# Patient Record
Sex: Female | Born: 1975 | Race: White | Hispanic: No | Marital: Married | Smoking: Never smoker
Health system: Southern US, Community
[De-identification: ages and names within clinical notes are randomized; demographics above are authoritative.]

## PROBLEM LIST (undated history)

## (undated) DIAGNOSIS — Z87442 Personal history of urinary calculi: Secondary | ICD-10-CM

## (undated) DIAGNOSIS — N930 Postcoital and contact bleeding: Secondary | ICD-10-CM

## (undated) DIAGNOSIS — N390 Urinary tract infection, site not specified: Secondary | ICD-10-CM

## (undated) DIAGNOSIS — T4145XA Adverse effect of unspecified anesthetic, initial encounter: Secondary | ICD-10-CM

## (undated) DIAGNOSIS — Z87440 Personal history of urinary (tract) infections: Secondary | ICD-10-CM

## (undated) DIAGNOSIS — B9689 Other specified bacterial agents as the cause of diseases classified elsewhere: Secondary | ICD-10-CM

## (undated) DIAGNOSIS — N289 Disorder of kidney and ureter, unspecified: Secondary | ICD-10-CM

## (undated) DIAGNOSIS — N760 Acute vaginitis: Secondary | ICD-10-CM

## (undated) DIAGNOSIS — M719 Bursopathy, unspecified: Secondary | ICD-10-CM

## (undated) DIAGNOSIS — J329 Chronic sinusitis, unspecified: Secondary | ICD-10-CM

## (undated) DIAGNOSIS — N943 Premenstrual tension syndrome: Secondary | ICD-10-CM

## (undated) DIAGNOSIS — N200 Calculus of kidney: Secondary | ICD-10-CM

## (undated) DIAGNOSIS — F419 Anxiety disorder, unspecified: Secondary | ICD-10-CM

## (undated) DIAGNOSIS — B379 Candidiasis, unspecified: Secondary | ICD-10-CM

## (undated) DIAGNOSIS — R310 Gross hematuria: Secondary | ICD-10-CM

## (undated) DIAGNOSIS — M722 Plantar fascial fibromatosis: Secondary | ICD-10-CM

## (undated) DIAGNOSIS — N159 Renal tubulo-interstitial disease, unspecified: Secondary | ICD-10-CM

## (undated) DIAGNOSIS — Z8619 Personal history of other infectious and parasitic diseases: Secondary | ICD-10-CM

## (undated) DIAGNOSIS — N921 Excessive and frequent menstruation with irregular cycle: Secondary | ICD-10-CM

## (undated) HISTORY — DX: Other specified bacterial agents as the cause of diseases classified elsewhere: B96.89

## (undated) HISTORY — DX: Acute vaginitis: N76.0

## (undated) HISTORY — DX: Adverse effect of unspecified anesthetic, initial encounter: T41.45XA

## (undated) HISTORY — DX: Renal tubulo-interstitial disease, unspecified: N15.9

## (undated) HISTORY — PX: ABDOMINAL HYSTERECTOMY: SHX81

## (undated) HISTORY — DX: Personal history of other infectious and parasitic diseases: Z86.19

## (undated) HISTORY — DX: Urinary tract infection, site not specified: N39.0

## (undated) HISTORY — DX: Gross hematuria: R31.0

## (undated) HISTORY — DX: Premenstrual tension syndrome: N94.3

## (undated) HISTORY — DX: Candidiasis, unspecified: B37.9

## (undated) HISTORY — PX: TONSILLECTOMY AND ADENOIDECTOMY: SUR1326

## (undated) HISTORY — DX: Disorder of kidney and ureter, unspecified: N28.9

## (undated) HISTORY — DX: Bursopathy, unspecified: M71.9

## (undated) HISTORY — DX: Anxiety disorder, unspecified: F41.9

## (undated) HISTORY — DX: Excessive and frequent menstruation with irregular cycle: N92.1

## (undated) HISTORY — DX: Personal history of urinary (tract) infections: Z87.440

## (undated) HISTORY — DX: Postcoital and contact bleeding: N93.0

---

## 1998-08-09 ENCOUNTER — Other Ambulatory Visit: Admission: RE | Admit: 1998-08-09 | Discharge: 1998-08-09 | Payer: Self-pay | Admitting: Obstetrics and Gynecology

## 1999-09-30 ENCOUNTER — Other Ambulatory Visit: Admission: RE | Admit: 1999-09-30 | Discharge: 1999-09-30 | Payer: Self-pay | Admitting: Obstetrics and Gynecology

## 2000-10-01 ENCOUNTER — Other Ambulatory Visit: Admission: RE | Admit: 2000-10-01 | Discharge: 2000-10-01 | Payer: Self-pay | Admitting: Obstetrics and Gynecology

## 2001-04-26 ENCOUNTER — Inpatient Hospital Stay (HOSPITAL_COMMUNITY): Admission: AD | Admit: 2001-04-26 | Discharge: 2001-04-26 | Payer: Self-pay | Admitting: Obstetrics and Gynecology

## 2001-04-27 ENCOUNTER — Inpatient Hospital Stay (HOSPITAL_COMMUNITY): Admission: AD | Admit: 2001-04-27 | Discharge: 2001-04-27 | Payer: Self-pay | Admitting: Obstetrics and Gynecology

## 2001-04-29 ENCOUNTER — Inpatient Hospital Stay (HOSPITAL_COMMUNITY): Admission: AD | Admit: 2001-04-29 | Discharge: 2001-04-29 | Payer: Self-pay | Admitting: Obstetrics and Gynecology

## 2001-05-10 ENCOUNTER — Inpatient Hospital Stay (HOSPITAL_COMMUNITY): Admission: AD | Admit: 2001-05-10 | Discharge: 2001-05-10 | Payer: Self-pay | Admitting: Obstetrics and Gynecology

## 2001-05-12 ENCOUNTER — Inpatient Hospital Stay (HOSPITAL_COMMUNITY): Admission: AD | Admit: 2001-05-12 | Discharge: 2001-05-14 | Payer: Self-pay | Admitting: Obstetrics and Gynecology

## 2001-09-14 ENCOUNTER — Other Ambulatory Visit: Admission: RE | Admit: 2001-09-14 | Discharge: 2001-09-14 | Payer: Self-pay | Admitting: Obstetrics and Gynecology

## 2002-04-07 DIAGNOSIS — T8859XA Other complications of anesthesia, initial encounter: Secondary | ICD-10-CM

## 2002-04-07 HISTORY — DX: Other complications of anesthesia, initial encounter: T88.59XA

## 2002-09-06 HISTORY — PX: BREAST REDUCTION SURGERY: SHX8

## 2002-09-20 ENCOUNTER — Other Ambulatory Visit: Admission: RE | Admit: 2002-09-20 | Discharge: 2002-09-20 | Payer: Self-pay | Admitting: Obstetrics and Gynecology

## 2009-08-28 ENCOUNTER — Inpatient Hospital Stay (HOSPITAL_COMMUNITY): Admission: AD | Admit: 2009-08-28 | Discharge: 2009-08-28 | Payer: Self-pay | Admitting: Obstetrics and Gynecology

## 2009-09-01 ENCOUNTER — Inpatient Hospital Stay (HOSPITAL_COMMUNITY): Admission: AD | Admit: 2009-09-01 | Discharge: 2009-09-01 | Payer: Self-pay | Admitting: Obstetrics and Gynecology

## 2009-09-07 ENCOUNTER — Inpatient Hospital Stay (HOSPITAL_COMMUNITY): Admission: RE | Admit: 2009-09-07 | Discharge: 2009-09-09 | Payer: Self-pay | Admitting: Obstetrics and Gynecology

## 2010-03-26 ENCOUNTER — Ambulatory Visit: Payer: Self-pay | Admitting: Family Medicine

## 2010-05-09 NOTE — Assessment & Plan Note (Signed)
Summary: COUGH AND CONGESTION/EVM   Vital Signs:  Patient Profile:   35 Years Old Female CC:      Cold & URI symptoms Height:     63 inches Weight:      143 pounds BMI:     25.42 O2 Sat:      97 % O2 treatment:    Room Air Temp:     97.6 degrees F oral Pulse rate:   80 / minute Pulse rhythm:   regular Resp:     20 per minute BP sitting:   119 / 77  (right arm)  Pt. in pain?   no  Vitals Entered By: Levonne Spiller EMT-P (March 26, 2010 12:34 PM)              Is Patient Diabetic? No  Does patient need assistance? Functional Status Self care Ambulation Normal      Current Allergies: ! SULFAHistory of Present Illness History from: patient Reason for visit: see chief complaint Chief Complaint: Cold & URI symptoms History of Present Illness: this patient presented today to the clinic because she's been having more than one week of cough congestion sinus drainage and sinus headache pressure.  She's having most of her symptoms in the maxillary and frontal sinus areas.  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 She denies fever but reports purulent sinus drainage. She reports sore throat and reports a bad taste in the back of the throat. She also reports that she's had a persistent hacking dry cough. She reports that she has been taking Delsym and Sudafed over-the-counter but no significant improvement in symptoms. She does have a history of seasonal allergies. She is also complaining of nasal congestion.  REVIEW OF SYSTEMS Constitutional Symptoms      Denies fever, chills, night sweats, weight loss, weight gain, and fatigue.  Eyes       Denies change in vision, eye pain, eye discharge, glasses, contact lenses, and eye surgery. Ear/Nose/Throat/Mouth       Complains of sinus  problems.      Denies hearing loss/aids, change in hearing, ear pain, ear discharge, dizziness, frequent runny nose, frequent nose bleeds, sore throat, hoarseness, and tooth pain or bleeding.  Respiratory       Complains of dry cough and productive cough.      Denies wheezing, shortness of breath, asthma, bronchitis, and emphysema/COPD.      Comments: Clear Sputum Cardiovascular       Denies murmurs, chest pain, and tires easily with exhertion.    Gastrointestinal       Denies stomach pain, nausea/vomiting, diarrhea, constipation, blood in bowel movements, and indigestion. Genitourniary       Denies painful urination, kidney stones, and loss of urinary control. Neurological       Denies paralysis, seizures, and fainting/blackouts. Musculoskeletal       Denies muscle pain, joint pain, joint stiffness, decreased range of motion, redness, swelling, muscle weakness, and gout.  Skin       Denies bruising, unusual mles/lumps or sores, and hair/skin or nail changes.  Psych       Denies mood changes, temper/anger issues, anxiety/stress, speech problems, depression, and sleep problems.  Past History:  Past Medical History: seasonal allergic rhinitis Medication treated depression  Past Surgical History: Denies surgical history  Family History: she reports that her mother has had some health problems but she has not been specific about that any further. She reports that her father is healthy and her sibling brother is healthy.  Social History: The patient reports no tobacco, alcohol or recreational drug use. She has 2 children. Physical Exam General appearance: well developed, well nourished, no acute distress Head: normocephalic, atraumatic Eyes: conjunctivae and lids normal Pupils: equal, round, reactive to light Ears: normal, no lesions or deformities Nasal: thick yellowish drainage, swollen nasal turbinates bilaterally with crusting noted and blood in the right nares  noted. Oral/Pharynx: tongue normal, posterior pharynx with mild erythema but no exudate noted. Neck: neck supple,  trachea midline, no masses Chest/Lungs: no rales, wheezes, or rhonchi bilateral, breath sounds equal without effort Heart: regular rate and  rhythm, no murmur Abdomen: soft, non-tender without obvious organomegaly Extremities: normal extremities Neurological: grossly intact and non-focal Skin: no obvious rashes or lesions MSE: oriented to time, place, and person Assessment Problems:   New Problems: ACUTE SINUSITIS, UNSPECIFIED (ICD-461.9) COUGH (ICD-786.2) ALLERGIC RHINITIS CAUSE UNSPECIFIED (ICD-477.9)   Patient Education: Patient and/or caregiver instructed in the following: rest, fluids. The risks, benefits and possible side effects were clearly explained and discussed with the patient.  The patient verbalized clear understanding.  The patient was given instructions to return if symptoms don't improve, worsen or new changes develop.  If it is not during clinic hours and the patient cannot get back to this clinic then the patient was told to seek medical care at an available urgent care or emergency department.  The patient verbalized understanding.   Demonstrates willingness to comply.  Plan New Medications/Changes: CLARITIN-D 12 HOUR 5-120 MG XR12H-TAB (LORATADINE-PSEUDOEPHEDRINE) take 1 by mouth every 12 hours as needed for nasal allergy congestion  #6 x 0, 03/26/2010, Clanford Johnson MD GUIATUSS AC 100-10 MG/5ML SYRP (GUAIFENESIN-CODEINE) take 1 teaspoon by mouth every 4 to 6 hours as needed severe cough and congestion: Caution WILL CAUSE DROWSINESS  #80 mL x 0, 03/26/2010, Clanford Johnson MD FLUTICASONE PROPIONATE 50 MCG/ACT SUSP (FLUTICASONE PROPIONATE) 2 sprays per nostril once daily  #1 x 1, 03/26/2010, Clanford Johnson MD AMOXICILLIN 875 MG TABS (AMOXICILLIN) take 1 by mouth two times a day until completed  #20 x 0, 03/26/2010, Clanford Johnson MD  Planning  Comments:   please take medications as prescribed.   Follow Up: Follow up in 2-3 days if no improvement, Follow up on an as needed basis, Follow up with Primary Physician  The patient and/or caregiver has been counseled thoroughly with regard to medications prescribed including dosage, schedule, interactions, rationale for use, and possible side effects and they verbalize understanding.  Diagnoses and expected course of recovery discussed and will return if not improved as expected or if the condition worsens. Patient and/or caregiver verbalized understanding.  Prescriptions: CLARITIN-D 12 HOUR 5-120 MG XR12H-TAB (LORATADINE-PSEUDOEPHEDRINE) take 1 by mouth every 12 hours as needed for nasal allergy congestion  #6 x 0   Entered and Authorized by:   Standley Dakins MD   Signed by:   Standley Dakins MD on 03/26/2010   Method used:   Electronically to        [facility] [address] (retail)       [address]       [place], [place]  [address]       Ph: [phone]       Fax: [phone]  RxID:   [id number] GUIATUSS AC 100-10 MG/5ML SYRP (GUAIFENESIN-CODEINE) take 1 teaspoon by mouth every 4 to 6 hours as needed severe cough and congestion: Caution WILL CAUSE DROWSINESS  #80 mL x 0   Entered and Authorized by:   Standley Dakins MD   Signed by:   Standley Dakins MD on 03/26/2010   Method used:   Printed then faxed to ...       [facility] [address] (retail)       [address], [address]       [place], [place]  [address]       Ph: [phone]       Fax: [phone]   RxID:   [id number] FLUTICASONE PROPIONATE 50 MCG/ACT SUSP (FLUTICASONE PROPIONATE) 2 sprays per nostril once daily  #1 x 1   Entered and Authorized by:   Standley Dakins MD   Signed by:   Standley Dakins MD on 03/26/2010   Method used:   Electronically to        [facility] [address] (retail)       [address]        [place], [place]  [address]       Ph: [phone]       Fax: [phone]   RxID:   [id number] AMOXICILLIN 875 MG TABS (AMOXICILLIN) take 1 by mouth two times a day until completed  #20 x 0   Entered and Authorized by:   Standley Dakins MD   Signed by:   Standley Dakins MD on 03/26/2010   Method used:   Electronically to        [facility] [address] (retail)       [address], [address]       [place], [place]  [address]       Ph: [phone]       Fax: [phone]   RxID:   [id number]   Patient Instructions: 1)  Return if no improvement or worsening in her symptoms. 2)  Please take medications as prescribed. 3)  The patient was informed that there is no on-call provider or services available at this clinic during off-hours (when the clinic is closed).  If the patient developed a problem or concern that required immediate attention, the patient was advised to go the the nearest available urgent care or emergency department for medical care.  The patient verbalized understanding.    4)  Take your antibiotic as prescribed until ALL of it is gone, but stop if you develop a rash or swelling and contact our office as soon as possible. 5)  Acute sinusitis symptoms for less than 10 days are not helped by antibiotics.Use warm moist compresses, and over the counter decongestants ( only as directed). Call if no improvement in 5-7 days, sooner if increasing pain, fever, or new symptoms.

## 2010-05-09 NOTE — Medication Information (Signed)
Summary: Tax adviser   Imported By: Dorna Leitz 03/26/2010 16:32:51  _____________________________________________________________________  External Attachment:    Type:   Image     Comment:   External Document

## 2010-06-17 ENCOUNTER — Emergency Department: Payer: Self-pay | Admitting: Emergency Medicine

## 2010-06-24 LAB — CREATININE CLEARANCE, URINE, 24 HOUR
Collection Interval-CRCL: 24 hours
Creatinine, 24H Ur: 1299 mg/d (ref 700–1800)
Urine Total Volume-CRCL: 2200 mL

## 2010-06-24 LAB — CBC
HCT: 31.4 % — ABNORMAL LOW (ref 36.0–46.0)
HCT: 35 % — ABNORMAL LOW (ref 36.0–46.0)
Hemoglobin: 12.2 g/dL (ref 12.0–15.0)
MCHC: 35.2 g/dL (ref 30.0–36.0)
MCHC: 35.1 g/dL (ref 30.0–36.0)
MCV: 101.9 fL — ABNORMAL HIGH (ref 78.0–100.0)
MCV: 103.1 fL — ABNORMAL HIGH (ref 78.0–100.0)
Platelets: 180 10*3/uL (ref 150–400)
Platelets: 183 10*3/uL (ref 150–400)
Platelets: 196 10*3/uL (ref 150–400)
RBC: 3.55 MIL/uL — ABNORMAL LOW (ref 3.87–5.11)
RBC: 3.48 MIL/uL — ABNORMAL LOW (ref 3.87–5.11)
RDW: 13.6 % (ref 11.5–15.5)
RDW: 13.9 % (ref 11.5–15.5)
WBC: 13 10*3/uL — ABNORMAL HIGH (ref 4.0–10.5)
WBC: 10.6 10*3/uL — ABNORMAL HIGH (ref 4.0–10.5)

## 2010-06-24 LAB — CREATININE, URINE, 24 HOUR
Collection Interval-UCRE24: 24 hours
Creatinine, 24H Ur: 1429 mg/d (ref 700–1800)
Creatinine, Urine: 65 mg/dL
Urine Total Volume-UCRE24: 2200 mL

## 2010-06-24 LAB — COMPREHENSIVE METABOLIC PANEL
ALT: 20 U/L (ref 0–35)
ALT: 21 U/L (ref 0–35)
AST: 34 U/L (ref 0–37)
AST: 26 U/L (ref 0–37)
Albumin: 2.6 g/dL — ABNORMAL LOW (ref 3.5–5.2)
CO2: 22 mEq/L (ref 19–32)
CO2: 25 mEq/L (ref 19–32)
Calcium: 8.9 mg/dL (ref 8.4–10.5)
Chloride: 104 mEq/L (ref 96–112)
Creatinine, Ser: 0.56 mg/dL (ref 0.4–1.2)
GFR calc Af Amer: >60 mL/min (ref >60)
GFR calc Af Amer: >60 mL/min (ref >60)
GFR calc non Af Amer: >60 mL/min (ref >60)
GFR calc non Af Amer: >60 mL/min (ref >60)
Potassium: 3.8 mEq/L (ref 3.5–5.1)
Sodium: 133 mEq/L — ABNORMAL LOW (ref 135–145)
Sodium: 138 mEq/L (ref 135–145)
Total Bilirubin: 0.5 mg/dL (ref 0.3–1.2)

## 2010-06-24 LAB — PROTEIN, URINE, 24 HOUR
Collection Interval-UPROT: 24 hours
Protein, 24H Urine: 132 mg/d — ABNORMAL HIGH (ref 50–100)
Protein, Urine: 6 mg/dL

## 2010-06-26 DIAGNOSIS — N921 Excessive and frequent menstruation with irregular cycle: Secondary | ICD-10-CM

## 2010-06-26 HISTORY — DX: Excessive and frequent menstruation with irregular cycle: N92.1

## 2010-08-23 NOTE — H&P (Signed)
[facility]  Patient:    Kayla Jimenez, Kayla Jimenez Visit Number: [id number] MRN: [id number]          Service Type: OBS Location: [id number] Attending Physician:  Jaymes Graff A Dictated by:   Mack Guise, C.N.M. Admit Date:  05/12/2001                           History and Physical  HISTORY OF PRESENT ILLNESS:   Kayla Jimenez is a 35 year old gravida 1 para 0 at 37 weeks who presents in active labor.  She reports positive fetal movement, no bleeding.  She is leaking clear fluid at the present time and is unsure of the exact time of rupture.  She denies any headache, visual changes, or epigastric pain.  Her pregnancy has been followed by the CNM service at [facility] and is remarkable for: 1. First trimester spotting. 2. History of frequent UTIs and pyelonephritis. 3. History of kidney stones. 4. Toxo risk. 5. Rh negative. 6. Group B strep negative. 7. Increased blood pressure with no proteinuria and normal PIH labs at the end    of pregnancy.  This patient was initially evaluated at the office of [facility] on November 04, 2000 at approximately [redacted] week gestation.  Her pregnancy has been essentially unremarkable with some blood pressure elevations at the end of the pregnancy, starting at approximately 35 weeks.  She is now 37 weeks.  Patient has not had any proteinuria and PIH labs have all been normal, and nonstress testing has been reactive.  OBSTETRICAL HISTORY:          Includes the present pregnancy.  MEDICAL HISTORY:              History of frequent UTIs and pyelonephritis, and history of kidney stones.  SURGICAL HISTORY:             Fractured skull and tonsillectomy.  FAMILY HISTORY:               Maternal grandmother with heart murmur.  Mother with irregular heart beat.  Maternal grandmother and maternal uncles x 3 and maternal aunt with a history of chronic hypertension.  Patients mother and brother with history of anemia.  Maternal grandmother  and maternal first cousin with a history of asthma.  Maternal grandfather with a history of diabetes secondary to cirrhosis of the liver.  Patients maternal grandmother and mother with a history of thyroid disease.  Maternal grandmother with kidney stones.  Mother with questionable uterine cancer.  Maternal grandmother with migraine headaches.  Patients mother has a history of depression on medication.  Maternal grandmother with depression.  GENETIC HISTORY:              There is no genetic history of familial or genetic disorders, children that died in infancy or that were born with birth defects.  There is a family history of twins and triplets in the distant past.  SOCIAL HISTORY:               Kayla Jimenez is a 35 year old married Caucasian female.  Her husband, Deyanira, [place]. is involved and supportive.  They are of the [facility] faith.  ALLERGIES:                    SULFA MEDICATIONS.  HABITS:                       She  denies the use of tobacco, alcohol, or illicit drugs.  REVIEW OF SYSTEMS:            There are no signs or symptoms suggestive of focal or systemic disease and the patient is typical of one with a uterine pregnancy at term in active labor.  PHYSICAL EXAMINATION:  VITAL SIGNS:                  Patients blood pressure is up - 150/90, 160/100.  She is afebrile.  HEENT:                        Unremarkable.  HEART:                        Regular rate and rhythm.  LUNGS:                        Clear.  ABDOMEN:                      Gravid in its contour.  Uterine fundus is noted to extend 40 cm above the level of the pubic symphysis.  Leopolds maneuvers find the infant to be in a longitudinal lie, cephalic presentation, and the estimated fetal weight is 7 pounds.  Fetal heart rate is reactive and reassuring.  Patient is contracting every two minutes.  PELVIC:                       Digital exam of the cervix on admission finds it to be 6-7 cm dilated.  On  present exam she is 9 cm, completely effaced, with the cephalic presenting part at a 0 station.  Fluid is clear.  EXTREMITIES:                  Patient had 2+ edema.  DTRs are 2+ with no clonus.  ASSESSMENT:                   1. Intrauterine pregnancy at term in active                                  labor.                               2. Increased blood pressure in labor.  PLAN:                         1. Admit per Dr. Marline Backbone.                               2. May have epidural.                               3. PIH labs and catheterized UA.                               4. Anticipate spontaneous vaginal delivery. Dictated by:   Mack Guise, C.N.M. Attending Physician:  Michael Litter DD:  05/12/01 TD:  05/12/01 Job: [id number]

## 2010-08-29 DIAGNOSIS — N930 Postcoital and contact bleeding: Secondary | ICD-10-CM

## 2010-08-29 DIAGNOSIS — F419 Anxiety disorder, unspecified: Secondary | ICD-10-CM

## 2010-08-29 DIAGNOSIS — B9689 Other specified bacterial agents as the cause of diseases classified elsewhere: Secondary | ICD-10-CM

## 2010-08-29 HISTORY — DX: Other specified bacterial agents as the cause of diseases classified elsewhere: B96.89

## 2010-08-29 HISTORY — DX: Anxiety disorder, unspecified: F41.9

## 2010-08-29 HISTORY — DX: Postcoital and contact bleeding: N93.0

## 2011-06-18 ENCOUNTER — Encounter: Payer: Self-pay | Admitting: Obstetrics and Gynecology

## 2011-06-26 ENCOUNTER — Encounter (INDEPENDENT_AMBULATORY_CARE_PROVIDER_SITE_OTHER): Payer: BC Managed Care – PPO | Admitting: Obstetrics and Gynecology

## 2011-06-26 DIAGNOSIS — N924 Excessive bleeding in the premenopausal period: Secondary | ICD-10-CM

## 2011-06-26 DIAGNOSIS — N926 Irregular menstruation, unspecified: Secondary | ICD-10-CM

## 2011-06-26 DIAGNOSIS — N921 Excessive and frequent menstruation with irregular cycle: Secondary | ICD-10-CM

## 2011-06-26 DIAGNOSIS — N898 Other specified noninflammatory disorders of vagina: Secondary | ICD-10-CM

## 2011-07-10 ENCOUNTER — Encounter: Payer: BC Managed Care – PPO | Admitting: Obstetrics and Gynecology

## 2011-07-10 ENCOUNTER — Other Ambulatory Visit: Payer: BC Managed Care – PPO

## 2011-07-10 DIAGNOSIS — N920 Excessive and frequent menstruation with regular cycle: Secondary | ICD-10-CM

## 2011-07-10 DIAGNOSIS — N943 Premenstrual tension syndrome: Secondary | ICD-10-CM

## 2011-07-10 HISTORY — DX: Premenstrual tension syndrome: N94.3

## 2011-08-08 DIAGNOSIS — N920 Excessive and frequent menstruation with regular cycle: Secondary | ICD-10-CM

## 2011-08-11 ENCOUNTER — Ambulatory Visit (INDEPENDENT_AMBULATORY_CARE_PROVIDER_SITE_OTHER): Payer: BC Managed Care – PPO | Admitting: Obstetrics and Gynecology

## 2011-08-11 ENCOUNTER — Encounter: Payer: Self-pay | Admitting: Obstetrics and Gynecology

## 2011-08-11 VITALS — BP 110/70 | Ht 63.0 in | Wt 149.0 lb

## 2011-08-11 DIAGNOSIS — Z124 Encounter for screening for malignant neoplasm of cervix: Secondary | ICD-10-CM

## 2011-08-11 DIAGNOSIS — N920 Excessive and frequent menstruation with regular cycle: Secondary | ICD-10-CM

## 2011-08-11 DIAGNOSIS — F3281 Premenstrual dysphoric disorder: Secondary | ICD-10-CM

## 2011-08-11 DIAGNOSIS — N898 Other specified noninflammatory disorders of vagina: Secondary | ICD-10-CM

## 2011-08-11 DIAGNOSIS — Z0000 Encounter for general adult medical examination without abnormal findings: Secondary | ICD-10-CM

## 2011-08-11 DIAGNOSIS — N943 Premenstrual tension syndrome: Secondary | ICD-10-CM

## 2011-08-11 LAB — POCT WET PREP (WET MOUNT)
Bacteria Wet Prep HPF POC: NEGATIVE
KOH Wet Prep POC: NEGATIVE

## 2011-08-11 MED ORDER — METRONIDAZOLE 500 MG PO TABS: 500.0000 mg | ORAL_TABLET | Freq: Two times a day (BID) | ORAL | Status: AC

## 2011-08-11 MED ORDER — FLUOXETINE HCL 20 MG PO CAPS: 20.0000 mg | ORAL_CAPSULE | Freq: Every day | ORAL | Status: DC

## 2011-08-11 NOTE — Patient Instructions (Signed)
Patient Education Materials to be provided at check out (*indicates is located in accordion folder):  *Mirena  

## 2011-08-11 NOTE — Progress Notes (Signed)
Last Pap: 08/2010 WNL: Yes Regular Periods:yes Contraception: none  Monthly Breast exam:yes Tetanus<57yrs:yes Nl.Bladder Function:yes Daily BMs:yes Healthy Diet:yes Calcium:no Mammogram:no Exercise:no Seatbelt: yes Abuse at home: no Stressful work:no Sigmoid-colonoscopy: n/a Bone Density: No  PMH: No Change FMH: No Change  Color: white Odor: yes Itching:yes Thin:yes Thick:no Fever:yes Dyspareunia:no Hx PID:no HX STD:no Pelvic Pain:no Desires Gc/CT:yes Desires HIV,RPR,HbsAG:no Physical Examination: General appearance - alert, well appearing, and in no distress Mental status - normal mood, behavior, speech, dress, motor activity, and thought processes Neck - supple, no significant adenopathy, thyroid exam: thyroid is normal in size without nodules or tenderness Chest - clear to auscultation, no wheezes, rales or rhonchi, symmetric air entry Heart - normal rate and regular rhythm Abdomen - soft, nontender, nondistended, no masses or organomegaly Breasts - breasts appear normal, no suspicious masses, no skin or nipple changes or axillary nodes Pelvic - normal external genitalia, vulva, vagina, cervix, uterus and adnexa Rectal - normal rectal, no masses, rectal exam not indicated Back exam - full range of motion, no tenderness, palpable spasm or pain on motion Neurological - alert, oriented, normal speech, no focal findings or movement disorder noted Musculoskeletal - no joint tenderness, deformity or swelling Extremities - no edema, redness or tenderness in the calves or thighs Skin - normal coloration and turgor, no rashes, no suspicious skin lesions noted Wet prep ph 5.0, positive whiff, greater than 20%clue cells Routine exam Menorrhagia BV Pap sent yes with cultures. Pt desires Mirena.  R&B reviewed.  Pt has no contrindications  Mammogram due no plans mirena insertion RT during next menses Flagyl rx and perineal hygeine reviewed

## 2011-08-11 NOTE — Assessment & Plan Note (Signed)
Pt no longer desires lysteda. She desires to try mirena.  Will schedule

## 2011-08-13 LAB — PAP IG, CT-NG, RFX HPV ASCU

## 2011-09-03 ENCOUNTER — Encounter: Payer: Self-pay | Admitting: Obstetrics and Gynecology

## 2011-09-03 ENCOUNTER — Ambulatory Visit: Payer: BC Managed Care – PPO | Admitting: Obstetrics and Gynecology

## 2011-09-03 VITALS — BP 110/70 | HR 72 | Ht 63.0 in | Wt 148.0 lb

## 2011-09-03 DIAGNOSIS — Z309 Encounter for contraceptive management, unspecified: Secondary | ICD-10-CM

## 2011-09-03 DIAGNOSIS — N289 Disorder of kidney and ureter, unspecified: Secondary | ICD-10-CM

## 2011-09-03 DIAGNOSIS — Z30430 Encounter for insertion of intrauterine contraceptive device: Secondary | ICD-10-CM

## 2011-09-03 DIAGNOSIS — Z87440 Personal history of urinary (tract) infections: Secondary | ICD-10-CM | POA: Insufficient documentation

## 2011-09-03 DIAGNOSIS — N943 Premenstrual tension syndrome: Secondary | ICD-10-CM

## 2011-09-03 DIAGNOSIS — Z8619 Personal history of other infectious and parasitic diseases: Secondary | ICD-10-CM

## 2011-09-03 DIAGNOSIS — N930 Postcoital and contact bleeding: Secondary | ICD-10-CM | POA: Insufficient documentation

## 2011-09-03 DIAGNOSIS — B379 Candidiasis, unspecified: Secondary | ICD-10-CM

## 2011-09-03 DIAGNOSIS — IMO0001 Reserved for inherently not codable concepts without codable children: Secondary | ICD-10-CM

## 2011-09-03 DIAGNOSIS — F419 Anxiety disorder, unspecified: Secondary | ICD-10-CM | POA: Insufficient documentation

## 2011-09-03 HISTORY — DX: Disorder of kidney and ureter, unspecified: N28.9

## 2011-09-03 LAB — POCT URINE PREGNANCY: Preg Test, Ur: NEGATIVE

## 2011-09-03 MED ORDER — LEVONORGESTREL 20 MCG/24HR IU IUD
INTRAUTERINE_SYSTEM | Freq: Once | INTRAUTERINE | Status: AC
  Administered 2011-09-03: 18:00:00 via INTRAUTERINE

## 2011-09-03 NOTE — Patient Instructions (Signed)
Follow up in 4 weeks  Call [facility] [phone]:  -for temperature of 100.4 degrees Fahrenheit or more -pain not improved with over the counter pain medications (Ibuprofen, Advil, Aleve,        Tylenol or acetaminophen) -for excessive bleeding (more than a usual period) -for any other concerns  Do not place anything in your vagina for the next 7 days   

## 2011-09-03 NOTE — Progress Notes (Signed)
IUD INSERTION NOTE  Kayla Jimenez is a 36 y.o. female [phone] who presents for IUD insertion. Patient has a history of menometrorrhagia.  Consent signed after risks and benefits were reviewed including but not limited to bleeding, infection, expulsion and risk of uterine perforation that may require an additional procedure for removal.  LMP: Patient's last menstrual period was 08/30/2011. UPT: negative  MIRENA LOT NUMBER: [id number]   Prepped  with Betadine/Hibiclens  Tenaculum placed on anterior lip of cervix after Hurricane gel was applied Uterus sounded at  7.5 cm Insertion of MIRENA IUD per protocol without any complications   Assessment:  IUD Insertion  Plan:  1. Patient instructed to call with oral temperature of 100.4 degrees Fahrenheit or more, excessive bleeding or pain that is not relieved with OTC analgesia taken as directed  2. Patient instructed on how  to check IUD strings and encouraged to do so after each menstrual cycle  3. Advised not to place anything in vagina or have sexual intercourse for 7 days  4. Follow-up: 4 weeks   Tamantha Saline PA-C 09/03/2011 5:34 PM

## 2011-09-24 ENCOUNTER — Encounter: Payer: BC Managed Care – PPO | Admitting: Obstetrics and Gynecology

## 2011-10-16 ENCOUNTER — Encounter: Payer: Self-pay | Admitting: Obstetrics and Gynecology

## 2011-10-16 ENCOUNTER — Ambulatory Visit (INDEPENDENT_AMBULATORY_CARE_PROVIDER_SITE_OTHER): Payer: BC Managed Care – PPO | Admitting: Obstetrics and Gynecology

## 2011-10-16 VITALS — BP 118/70 | HR 72 | Wt 150.0 lb

## 2011-10-16 DIAGNOSIS — Z30431 Encounter for routine checking of intrauterine contraceptive device: Secondary | ICD-10-CM

## 2011-10-16 DIAGNOSIS — R350 Frequency of micturition: Secondary | ICD-10-CM

## 2011-10-16 LAB — POCT URINALYSIS DIPSTICK
Bilirubin, UA: NEGATIVE
Glucose, UA: NEGATIVE
Nitrite, UA: NEGATIVE
Spec Grav, UA: >=1.03
Urobilinogen, UA: NEGATIVE

## 2011-10-16 MED ORDER — FLUOXETINE HCL 20 MG PO CAPS: 20.0000 mg | ORAL_CAPSULE | Freq: Every day | ORAL | Status: AC

## 2011-10-16 MED ORDER — CIPROFLOXACIN HCL 500 MG PO TABS: 500.0000 mg | ORAL_TABLET | Freq: Two times a day (BID) | ORAL | Status: AC

## 2011-10-16 NOTE — Progress Notes (Signed)
36 YO with IUD (Mirena) inserted 09/03/11 reporting decreased cramping (only a twinge) since insertion, daily spotting and urinary frequency with nocturia x 3. Denies vaginitis symptoms, fever, flank pain,  or changes in bowel function.   O: Abdomen: soft, non-tender      Pelvic: EGBUS-wnl, vagina-norma, with scant blood  , cervix with visible string and no lesions, uterus-normal size without tenderness, adnexae-no masses or  tenderness     UPT-negative     U/A- pH- 5.0,  SG-1.030 trace-leuk and 2 + blood  A: IUD Check     Urinary Frequency  P: Cipro 500 mg #6 bid x 3d      Bleeding calendar, increase hydration, perineal hygiene     RTO-Aex or prn  Tuwanna Krausz  PA-C

## 2011-12-02 ENCOUNTER — Encounter: Payer: BC Managed Care – PPO | Admitting: Obstetrics and Gynecology

## 2011-12-09 ENCOUNTER — Ambulatory Visit (INDEPENDENT_AMBULATORY_CARE_PROVIDER_SITE_OTHER): Payer: BC Managed Care – PPO | Admitting: Obstetrics and Gynecology

## 2011-12-09 ENCOUNTER — Encounter: Payer: Self-pay | Admitting: Obstetrics and Gynecology

## 2011-12-09 VITALS — BP 104/68 | Temp 99.1°F | Wt 147.0 lb

## 2011-12-09 DIAGNOSIS — N390 Urinary tract infection, site not specified: Secondary | ICD-10-CM

## 2011-12-09 DIAGNOSIS — IMO0001 Reserved for inherently not codable concepts without codable children: Secondary | ICD-10-CM

## 2011-12-09 DIAGNOSIS — Z975 Presence of (intrauterine) contraceptive device: Secondary | ICD-10-CM

## 2011-12-09 DIAGNOSIS — N8111 Cystocele, midline: Secondary | ICD-10-CM

## 2011-12-09 DIAGNOSIS — R350 Frequency of micturition: Secondary | ICD-10-CM

## 2011-12-09 DIAGNOSIS — IMO0002 Reserved for concepts with insufficient information to code with codable children: Secondary | ICD-10-CM

## 2011-12-09 LAB — POCT URINALYSIS DIPSTICK
Bilirubin, UA: NEGATIVE
Ketones, UA: NEGATIVE
Nitrite, UA: NEGATIVE
Protein, UA: NEGATIVE
pH, UA: 5

## 2011-12-09 LAB — POCT URINE PREGNANCY: Preg Test, Ur: NEGATIVE

## 2011-12-09 MED ORDER — CIPROFLOXACIN HCL 250 MG PO TABS: 250.0000 mg | ORAL_TABLET | Freq: Two times a day (BID) | ORAL | Status: AC

## 2011-12-09 NOTE — Progress Notes (Signed)
Contraception: IUD  MIRENA History of STD:  no history of PID, STD's History of ovarian cyst: no History of fibroids: no History of endometriosis:no Previous ultrasound:no  Urinary symptoms: urinary frequency and urinary incontinence Gastro-intestinal symptoms:  Constipation: no     Diarrhea: no     Nausea: no     Vomiting: no     Fever: no Vaginal discharge: no vaginal discharge

## 2011-12-09 NOTE — Patient Instructions (Addendum)
Cystocele Repair A cystocele is a bulging, drooping hernia or break (rupture) of bladder tissue into the birth canal (vagina). This bulging or rupture occurs on the top front wall of the vagina. CAUSES  Cystocele is associated with weakness of the top front wall of the vagina due to stretching and tearing of the ligaments and muscles in the area. This is often the result of:  Multiple childbirths.   Continuous heavy lifting.   Chronic cough from asthma, emphysema, or smoking.   Being overweight.   Changes from aging.   Previous surgery in the vaginal area.   Menopause with loss of estrogen hormone and weakening of the ligaments and muscles around the bladder.  SYMPTOMS   Uncontrolled loss of urine (incontinence) with cough, sneeze, or exercise.   Pelvic pressure.   Frequency or urgency to urinate because of inability to completely empty the bladder.   Bladder infections.   Needing to push on the upper vagina to help yourself pass urine.  DIAGNOSIS  A cystocele can be diagnosed by doing a pelvic exam and observing the top of the vagina drooping or bulging into or out of the vagina. TREATMENT  Surgical options:  Cystocele repair is surgery that removes the hernia.   There are also different "sling" operations that may be used.  Discuss the different types of surgeries to repair a cystocele with your caregiver. Your caregiver will decide what type of surgery will be best in your case. Nonsurgical options:  Kegel exercises. This helps strengthen and tighten the muscles and tissue in and around the bladder and vagina. This may help with mild cases of cystocele.   A pessary may help the cystocele. A pessary is a plastic or rubber device that lifts the bladder into place. A pessary must be fitted by a doctor.   Tampons or diaphragms that lift the bladder into place are sometimes helpful with a minor or small cystocele.   Estrogen may help with mild cases in menopausal and aging  women.  LET YOUR CAREGIVER KNOW ABOUT:   Allergies to food or medicine.   Medicines taken, including vitamins, herbs, eyedrops, over-the-counter medicines, and creams.   Use of steroids (by mouth or creams).   Previous problems with anesthetics or numbing medicines.   History of bleeding problems or blood clots.   Previous surgery.   Other health problems, including diabetes and kidney problems.   Possibility of pregnancy, if this applies.  RISKS AND COMPLICATIONS  All surgery is associated with risks.  There are risks with a general anesthesia. You should discuss this with your caregiver.   With spinal or epidural anesthesia, there may be an area that is not numbed, and you could feel pain.   Headache could occur with a spinal or epidural anesthetic.   The catheter you will have after surgery may not work properly or may get blocked and need to be replaced.   Excessive bleeding.   Infection.   Injury to surrounding structures.   Recurrence of the cystocele.   Surgery may not get rid of your symptoms.  BEFORE THE PROCEDURE   Do not take aspirin or blood thinners for 1 week prior to surgery, unless instructed otherwise.   Do not eat or drink anything after midnight the night before surgery.   Let your caregiver know if you develop a cold or other infectious problems prior to surgery.   If being admitted the day of surgery, you should be present 1 hour prior to your   procedure or as directed by your caregiver.   Plan and arrange for help when you go home from the hospital.   If you smoke, do not smoke for at least 2 weeks before the surgery.   Do not drink any alcohol for 3 days before the surgery.  PROCEDURE  You will be given an anesthetic to prevent you from feeling pain during surgery. This may be a general anesthetic that puts you to sleep, or a spinal or epidural anesthetic. You will be asleep or be numbed through the entire procedure. During cystocele repair,  tissue is pulled from the sides and around the top of the vagina to lift up the hernia. This removes the hernia so that the top of the vagina does not fall into the opening of the vagina. AFTER THE PROCEDURE  After surgery, you will be taken to the recovery room where a nurse will take care of you, checking your breathing, blood pressure, pulse, and your progress. When your caregiver feels you are stable, you will be taken to your room. You will have a drainage tube (Foley catheter) that will drain your bladder for 2 to 7 days or longer, until your bladder is working properly. This catheter is placed prior to surgery to help keep your bladder empty and out of the way during the procedure. After surgery, this will make passing your urine easier. The catheter will be removed when you can easily pass urine without this assistance. You may have gauze packing in the vagina that will be removed 1 to 2 days after the surgery. Usually, you will be given a medicine (antibiotic) that kills germs. You will be given pain medicine as needed. You can usually go home in 3 to 5 days. HOME CARE INSTRUCTIONS   Do not take baths. Take showers until your caregiver informs you otherwise.   Take antibiotics as directed by your caregiver.   Exercise as instructed. Do not perform exercises which increase the pressure inside your belly (abdomen), such as sit-ups or lifting weights, until your caregiver has given permission. Walking exercise is preferred.   Only take over-the-counter or prescription medicines for pain and discomfort as directed by your caregiver.   Do not drink alcohol while taking pain medicine.   Do not lift anything over 5 pounds.   Do not drive until your caregiver gives you permission.   Get plenty of rest and sleep.   Have someone help with your household chores for 1 to 2 weeks.   If you develop constipation, you may take a mild laxative with your caregiver's permission. Eating bran foods and  drinking enough water and fluids to keep your urine clear or pale yellow helps with constipation.   Do not take aspirin. It may cause bleeding.   You may resume normal diet and unstrenuous activities as directed.   Do not douche, use tampons, or engage in intercourse until your surgeon has given permission.   Change bandages (dressings) as directed.   Make and keep all your postoperative appointments.  SEEK MEDICAL CARE IF:   You have abnormal vaginal discharge.   You develop a rash.   You are having a reaction to your medicine.   You develop nausea or vomiting.  SEEK IMMEDIATE MEDICAL CARE IF:   You have redness, swelling, or increasing pain in the vaginal area.   You notice pus coming from the vagina.   You have a fever.   You notice a bad smell coming from the vagina.     You have increasing abdominal pain.   You have frequent urination or you notice burning during urination.   You notice blood in your urine.   You have excessive vaginal bleeding.   You cannot urinate.  MAKE SURE YOU:   Understand these instructions.   Will watch your condition.   Will get help right away if you are not doing well or get worse.  Document Released: 03/21/2000 Document Revised: 03/13/2011 Document Reviewed: 06/21/2009 [employer] Patient Information 2012 [employer]. 

## 2011-12-09 NOTE — Progress Notes (Signed)
36 YO complains of urinary incontinence with coughing and sneezing.  Has noticed this since delivery (vaginal with both 8 + pounds) .  Has no urgency, dysuria, hematuria but has had renal stones.  Last renal stone flare 09-19-2010,  passed on its own.  This past period lasted longer and was heavy (as it was before IUD).  Also noticed more cramping.  U/A= pH- 5.0,  SG- 1.020,  leukocytes 4 + UPT-negative  O: Abdomen: soft,       Pelvic: EGBUS-wnl, vagina-3/4 cystocele (patient unable to [employer] because of a full bladder), cervix-string visible, uterus-normal size without tenderness      adnexae-no masses  A:  UTI      Symptomatic Cystocele      Stress Urinary Incontinence      Mirena IUD     H/O Renal Stones  P: Cipro 250 mg  # 14  1 po bid x 7 days no refill       Increase water &  cranberry tablets       Reviewed management options for SUI symptoms:  Kegels, timed voids, pessary and anterior repair      Patient interested in anterior repair-reviewed procedure;  sees Dr. Normand Sloop and I will forward request to Dr. Normand Sloop      Urine for culture      RTO-as scheduled or prn  Iyesha Such, PA-C

## 2011-12-11 LAB — URINE CULTURE: Colony Count: 15000

## 2011-12-22 ENCOUNTER — Telehealth: Payer: Self-pay | Admitting: Obstetrics and Gynecology

## 2011-12-22 MED ORDER — CIPROFLOXACIN HCL 500 MG PO TABS: 500.0000 mg | ORAL_TABLET | Freq: Two times a day (BID) | ORAL | Status: AC

## 2011-12-22 NOTE — Telephone Encounter (Signed)
Pt called with uti sx per EP pt can have cipro BID for three days the patient is now scheduled for Lumax on 12/29/11 @ 2:30p and f/u visit with AR @ 3:45p pt agreeable DFaulconer RN

## 2011-12-22 NOTE — Telephone Encounter (Signed)
EP pt

## 2012-01-05 ENCOUNTER — Encounter: Payer: BC Managed Care – PPO | Admitting: Obstetrics and Gynecology

## 2012-01-05 ENCOUNTER — Telehealth: Payer: Self-pay | Admitting: Obstetrics and Gynecology

## 2012-01-08 ENCOUNTER — Encounter: Payer: BC Managed Care – PPO | Admitting: Obstetrics and Gynecology

## 2012-01-12 ENCOUNTER — Encounter: Payer: Self-pay | Admitting: Obstetrics and Gynecology

## 2012-01-12 ENCOUNTER — Ambulatory Visit (INDEPENDENT_AMBULATORY_CARE_PROVIDER_SITE_OTHER): Payer: BC Managed Care – PPO | Admitting: Obstetrics and Gynecology

## 2012-01-12 VITALS — BP 104/60 | HR 68 | Wt 146.0 lb

## 2012-01-12 DIAGNOSIS — N393 Stress incontinence (female) (male): Secondary | ICD-10-CM

## 2012-01-12 DIAGNOSIS — N920 Excessive and frequent menstruation with regular cycle: Secondary | ICD-10-CM

## 2012-01-12 DIAGNOSIS — IMO0002 Reserved for concepts with insufficient information to code with codable children: Secondary | ICD-10-CM

## 2012-01-12 DIAGNOSIS — Z87440 Personal history of urinary (tract) infections: Secondary | ICD-10-CM

## 2012-01-12 DIAGNOSIS — N8111 Cystocele, midline: Secondary | ICD-10-CM

## 2012-01-12 LAB — POCT URINALYSIS DIPSTICK
Bilirubin, UA: NEGATIVE
Blood, UA: NEGATIVE
Glucose, UA: NEGATIVE
Spec Grav, UA: 1.025
pH, UA: 5

## 2012-01-12 NOTE — Progress Notes (Addendum)
Here to f/u lumax results  Imp SUI, no ISD  Pt says she uses IUD for cycle control but recently she had problem with bleeding.  She reports leaking mostly with cough laugh sneeze. She reports neg EmBx in April.  Filed Vitals:   01/12/12 1615  BP: 104/60  Pulse: 68   A/P Many questions answered Pt wants to proceed with surgery the first week in Dec Will sched TVT, Ant repair and Ablation (her husband has vasectomy) and IUD removal R/B/A discussed Preop to be scheduled with EP on same day I am in office so I can do a pelvic exam on the pt myself prior to surgery (ok to do in EP rooms while she sees pt for preop)

## 2012-01-14 ENCOUNTER — Encounter: Payer: BC Managed Care – PPO | Admitting: Obstetrics and Gynecology

## 2012-01-28 ENCOUNTER — Telehealth: Payer: Self-pay | Admitting: Obstetrics and Gynecology

## 2012-01-28 ENCOUNTER — Other Ambulatory Visit: Payer: Self-pay | Admitting: Obstetrics and Gynecology

## 2012-01-28 NOTE — Telephone Encounter (Signed)
TVT; Anterior Repair; Ablation scheduled for 03/16/12@ 9:30 with AR/EP.  [employer] effective 10/06/11.  Plan pays 80/20 after a $350 deductible. Pre-op due $312.72. -Adrianne Pridgen

## 2012-03-02 ENCOUNTER — Encounter: Payer: Self-pay | Admitting: Obstetrics and Gynecology

## 2012-03-02 ENCOUNTER — Encounter (HOSPITAL_COMMUNITY): Payer: Self-pay | Admitting: *Deleted

## 2012-03-02 ENCOUNTER — Ambulatory Visit (INDEPENDENT_AMBULATORY_CARE_PROVIDER_SITE_OTHER): Payer: BC Managed Care – PPO | Admitting: Obstetrics and Gynecology

## 2012-03-02 VITALS — BP 110/70 | HR 78 | Temp 98.8°F | Resp 14 | Ht 63.0 in | Wt 149.0 lb

## 2012-03-02 DIAGNOSIS — Z01818 Encounter for other preprocedural examination: Secondary | ICD-10-CM

## 2012-03-02 NOTE — Progress Notes (Signed)
Kayla Jimenez is a 36 y.o. female G2P2002 who presents for placement of tension free vaginal tape, removal of Mirena IUD and endometrial ablation because stress urinary incontinence and menorrhagia.  Since the birth of her last child in 2011 patient has noticed worsening incontinence when she laughs, sneezes or lifts. She denies urgency, dysuria or frequency though she may have all of these symptoms when she has a renal stone (last occurrence 2012).  As for her menstrual flow, it has gotten heavier in spite of her having a Mirena IUD.  Her flow lasts  for 10 days with a pad change four times a day. She has some cramping (rated 6/10 on a 10 point pain scale) but finds relief with Pamprin.  She denies intermenstrual  or post-coital bleeding, vaginitis symptoms or changes in bowel movements.  An endometrial biopsy in April of 2013 did not reveal any hyperplasia, atypia or malignancy, GC and CT cultures were negative and TSH and prolactin levels normal.  She has no history of fibroids or endometrial polyps.  After reviewing both medical and surgical options for managing her symptoms, the patient has decided to proceed with placement of tension free vaginal tape, removal of Mirena IUD and endometrial ablation.  Past Medical History  OB History: G2P2002 SVD: 2003 and 2011 with both infants weighing 8+ pounds  GYN History: menarche    LMP    Contracepton vasectomy  The patient denies history of sexually transmitted disease.  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 FLUoxetine (PROZAC) 20 MG capsule Take 1 capsule (20 mg total) by mouth daily.  30 capsule  10 . nabumetone (RELAFEN) 500 MG tablet Take 500 mg by mouth 2 (two) times daily as needed.        Allergies Allergen Reactions . Sulfonamide Derivatives    REACTION: Eyes Itch   Denies sensitivity to peanuts, shellfish, soy, latex or adhesives.  ROS: Denies headache, vision changes, nasal congestion, dysphagia, tinnitus, dizziness, hoarseness, cough,  chest pain, shortness of breath, nausea, vomiting, diarrhea,constipation,  urinary frequency, urgency  dysuria, hematuria, vaginitis symptoms, pelvic pain, swelling of joints,easy bruising,  myalgias, arthralgias, skin rashes, unexplained weight loss and except as is mentioned in the history of present illness, patient's review of systems is otherwise negative.     Physical Exam    BP 110/70  Pulse 78  Temp 98.8 F (37.1 C) (Oral)  Resp 14  Ht 5' 3" (1.6 m)  Wt 149 lb (67.586 kg)  BMI 26.39 kg/m2  LMP 02/19/2012  Neck: supple without masses or thyromegaly Lungs: clear to auscultation Heart: regular rate and rhythm Abdomen: soft, non-tender and no organomegaly Pelvic:EGBUS- wnl; vagina-with small cystocele; uterus-mild descensus, normal size, cervix without lesions or motion tenderness; adnexae-no tenderness or masses Extremities:  no clubbing, cyanosis or edema   Assesment:  Stress Urinary Incontinence                          Menorrhagia   Disposition:  A discussion was held with patient regarding the indication for her procedure(s) along with the risks, which include but are not limited to: reaction to anesthesia, damage to adjacent organs, infection,  excessive bleeding, erosion of transvaginal tape and worsening urinary  tract symptoms. Patient verbalized understanding of these risks and has consented to proceed with the placement of tension free vaginal tape, possible anterior repair, removal of Mirena IUD, hysteroscopy, dilatation, curettage and endometrial ablation at [facility], March 16, 2012 at 9:30 a.m.   CSN# [id number]   Betta Balla J. Donnis Pecha, PA-C  for Dr. Angela Y. Roberts  

## 2012-03-03 ENCOUNTER — Other Ambulatory Visit: Payer: Self-pay | Admitting: Obstetrics and Gynecology

## 2012-03-03 NOTE — H&P (Signed)
Kayla Jimenez is a 36 y.o. female G2P2002 who presents for placement of tension free vaginal tape, removal of Mirena IUD and endometrial ablation because stress urinary incontinence and menorrhagia.  Since the birth of her last child in 2011 patient has noticed worsening incontinence when she laughs, sneezes or lifts. She denies urgency, dysuria or frequency though she may have all of these symptoms when she has a renal stone (last occurrence 2012).  As for her menstrual flow, it has gotten heavier in spite of her having a Mirena IUD.  Her flow lasts  for 10 days with a pad change four times a day. She has some cramping (rated 6/10 on a 10 point pain scale) but finds relief with Pamprin.  She denies intermenstrual  or post-coital bleeding, vaginitis symptoms or changes in bowel movements.  An endometrial biopsy in April of 2013 did not reveal any hyperplasia, atypia or malignancy, GC and CT cultures were negative and TSH and prolactin levels normal.  She has no history of fibroids or endometrial polyps.  After reviewing both medical and surgical options for managing her symptoms, the patient has decided to proceed with placement of tension free vaginal tape, removal of Mirena IUD and endometrial ablation.  Past Medical History  OB History: G2P2002 SVD: 2003 and 2011 with both infants weighing 8+ pounds  GYN History: menarche    LMP    Contracepton vasectomy  The patient denies history of sexually transmitted disease.  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 FLUoxetine (PROZAC) 20 MG capsule Take 1 capsule (20 mg total) by mouth daily.  30 capsule  10 . nabumetone (RELAFEN) 500 MG tablet Take 500 mg by mouth 2 (two) times daily as needed.        Allergies Allergen Reactions . Sulfonamide Derivatives    REACTION: Eyes Itch   Denies sensitivity to peanuts, shellfish, soy, latex or adhesives.  ROS: Denies headache, vision changes, nasal congestion, dysphagia, tinnitus, dizziness, hoarseness, cough,  chest pain, shortness of breath, nausea, vomiting, diarrhea,constipation,  urinary frequency, urgency  dysuria, hematuria, vaginitis symptoms, pelvic pain, swelling of joints,easy bruising,  myalgias, arthralgias, skin rashes, unexplained weight loss and except as is mentioned in the history of present illness, patient's review of systems is otherwise negative.     Physical Exam    BP 110/70  Pulse 78  Temp 98.8 F (37.1 C) (Oral)  Resp 14  Ht 5' 3" (1.6 m)  Wt 149 lb (67.586 kg)  BMI 26.39 kg/m2  LMP 02/19/2012  Neck: supple without masses or thyromegaly Lungs: clear to auscultation Heart: regular rate and rhythm Abdomen: soft, non-tender and no organomegaly Pelvic:EGBUS- wnl; vagina-with small cystocele; uterus-mild descensus, normal size, cervix without lesions or motion tenderness; adnexae-no tenderness or masses Extremities:  no clubbing, cyanosis or edema   Assesment:  Stress Urinary Incontinence                          Menorrhagia   Disposition:  A discussion was held with patient regarding the indication for her procedure(s) along with the risks, which include but are not limited to: reaction to anesthesia, damage to adjacent organs, infection,  excessive bleeding, erosion of transvaginal tape and worsening urinary  tract symptoms. Patient verbalized understanding of these risks and has consented to proceed with the placement of tension free vaginal tape, possible anterior repair, removal of Mirena IUD, hysteroscopy, dilatation, curettage and endometrial ablation at [facility], March 16, 2012 at 9:30 a.m.   CSN# [id number]   Addison Freimuth J. Curly Mackowski, PA-C  for Dr. Angela Y. Roberts  

## 2012-03-06 ENCOUNTER — Encounter (HOSPITAL_COMMUNITY): Payer: Self-pay | Admitting: Pharmacist

## 2012-03-16 ENCOUNTER — Ambulatory Visit (HOSPITAL_COMMUNITY)
Admission: RE | Admit: 2012-03-16 | Discharge: 2012-03-17 | Disposition: A | Discharge status: Home / Self Care | Payer: BC Managed Care – PPO | Source: Ambulatory Visit | Attending: Obstetrics and Gynecology | Admitting: Obstetrics and Gynecology

## 2012-03-16 ENCOUNTER — Encounter (HOSPITAL_COMMUNITY): Payer: Self-pay | Admitting: *Deleted

## 2012-03-16 ENCOUNTER — Encounter (HOSPITAL_COMMUNITY): Payer: Self-pay | Admitting: Anesthesiology

## 2012-03-16 ENCOUNTER — Ambulatory Visit (HOSPITAL_COMMUNITY): Payer: BC Managed Care – PPO | Admitting: Anesthesiology

## 2012-03-16 ENCOUNTER — Encounter (HOSPITAL_COMMUNITY)
Admission: RE | Disposition: A | Discharge status: Home / Self Care | Payer: Self-pay | Source: Ambulatory Visit | Attending: Obstetrics and Gynecology

## 2012-03-16 DIAGNOSIS — Z30432 Encounter for removal of intrauterine contraceptive device: Secondary | ICD-10-CM | POA: Insufficient documentation

## 2012-03-16 DIAGNOSIS — N8111 Cystocele, midline: Secondary | ICD-10-CM

## 2012-03-16 DIAGNOSIS — N393 Stress incontinence (female) (male): Secondary | ICD-10-CM

## 2012-03-16 DIAGNOSIS — N920 Excessive and frequent menstruation with regular cycle: Secondary | ICD-10-CM | POA: Insufficient documentation

## 2012-03-16 DIAGNOSIS — N816 Rectocele: Secondary | ICD-10-CM

## 2012-03-16 DIAGNOSIS — N812 Incomplete uterovaginal prolapse: Secondary | ICD-10-CM | POA: Insufficient documentation

## 2012-03-16 HISTORY — PX: CYSTOSCOPY: SHX5120

## 2012-03-16 HISTORY — PX: BLADDER SUSPENSION: SHX72

## 2012-03-16 HISTORY — PX: ANTERIOR AND POSTERIOR REPAIR: SHX5121

## 2012-03-16 HISTORY — PX: DILITATION & CURRETTAGE/HYSTROSCOPY WITH NOVASURE ABLATION: SHX5568

## 2012-03-16 LAB — CBC
HCT: 35.9 % — ABNORMAL LOW (ref 36.0–46.0)
MCH: 31.9 pg (ref 26.0–34.0)
MCHC: 34.5 g/dL (ref 30.0–36.0)
MCV: 92.3 fL (ref 78.0–100.0)
Platelets: 200 10*3/uL (ref 150–400)
Platelets: 244 10*3/uL (ref 150–400)
RBC: 3.54 MIL/uL — ABNORMAL LOW (ref 3.87–5.11)
RDW: 13.5 % (ref 11.5–15.5)
WBC: 12.5 10*3/uL — ABNORMAL HIGH (ref 4.0–10.5)
WBC: 17.7 10*3/uL — ABNORMAL HIGH (ref 4.0–10.5)

## 2012-03-16 SURGERY — TRANSVAGINAL TAPE (TVT) PROCEDURE
Anesthesia: General | Site: Vagina | Wound class: Clean Contaminated

## 2012-03-16 MED ORDER — ONDANSETRON HCL 4 MG/2ML IJ SOLN
INTRAMUSCULAR | Status: AC
  Filled 2012-03-16: qty 2

## 2012-03-16 MED ORDER — IBUPROFEN 600 MG PO TABS: 600.0000 mg | ORAL_TABLET | Freq: Four times a day (QID) | ORAL | Status: DC | PRN

## 2012-03-16 MED ORDER — ROCURONIUM BROMIDE 100 MG/10ML IV SOLN
INTRAVENOUS | Status: DC | PRN
  Administered 2012-03-16: 50 mg via INTRAVENOUS

## 2012-03-16 MED ORDER — SODIUM CHLORIDE 0.9 % IJ SOLN: 9.0000 mL | INTRAMUSCULAR | Status: DC | PRN

## 2012-03-16 MED ORDER — LIDOCAINE HCL 1 % IJ SOLN
INTRAMUSCULAR | Status: DC | PRN
  Administered 2012-03-16: 10 mL

## 2012-03-16 MED ORDER — ONDANSETRON HCL 4 MG PO TABS: 4.0000 mg | ORAL_TABLET | Freq: Three times a day (TID) | ORAL | Status: DC | PRN

## 2012-03-16 MED ORDER — CEFAZOLIN SODIUM-DEXTROSE 2-3 GM-% IV SOLR
INTRAVENOUS | Status: AC
  Filled 2012-03-16: qty 50

## 2012-03-16 MED ORDER — KETOROLAC TROMETHAMINE 30 MG/ML IJ SOLN
15.0000 mg | Freq: Once | INTRAMUSCULAR | Status: AC | PRN
  Administered 2012-03-16: 30 mg via INTRAVENOUS

## 2012-03-16 MED ORDER — DEXAMETHASONE SODIUM PHOSPHATE 4 MG/ML IJ SOLN
INTRAMUSCULAR | Status: DC | PRN
  Administered 2012-03-16: 10 mg via INTRAVENOUS

## 2012-03-16 MED ORDER — DIPHENHYDRAMINE HCL 50 MG/ML IJ SOLN: 12.5000 mg | Freq: Four times a day (QID) | INTRAMUSCULAR | Status: DC | PRN

## 2012-03-16 MED ORDER — HYDROMORPHONE 0.3 MG/ML IV SOLN
INTRAVENOUS | Status: DC
  Administered 2012-03-16: 14:00:00 via INTRAVENOUS
  Administered 2012-03-16: 2.4 mg via INTRAVENOUS
  Administered 2012-03-17 (×2): 0.4 mg via INTRAVENOUS
  Filled 2012-03-16: qty 25

## 2012-03-16 MED ORDER — SCOPOLAMINE 1 MG/3DAYS TD PT72
MEDICATED_PATCH | TRANSDERMAL | Status: AC
  Administered 2012-03-16: 1.5 mg
  Filled 2012-03-16: qty 1

## 2012-03-16 MED ORDER — DIPHENHYDRAMINE HCL 12.5 MG/5ML PO ELIX
12.5000 mg | ORAL_SOLUTION | Freq: Four times a day (QID) | ORAL | Status: DC | PRN
  Filled 2012-03-16: qty 5

## 2012-03-16 MED ORDER — ONDANSETRON HCL 4 MG/2ML IJ SOLN: 4.0000 mg | Freq: Four times a day (QID) | INTRAMUSCULAR | Status: DC | PRN

## 2012-03-16 MED ORDER — INFLUENZA VIRUS VACC SPLIT PF IM SUSP: 0.5000 mL | Freq: Once | INTRAMUSCULAR | Status: DC

## 2012-03-16 MED ORDER — VASOPRESSIN 20 UNIT/ML IJ SOLN
INTRAVENOUS | Status: DC | PRN
  Administered 2012-03-16 (×2): via INTRAMUSCULAR

## 2012-03-16 MED ORDER — OXYCODONE-ACETAMINOPHEN 5-325 MG PO TABS
1.0000 | ORAL_TABLET | ORAL | Status: DC | PRN
  Administered 2012-03-17: 1 via ORAL
  Filled 2012-03-16: qty 1

## 2012-03-16 MED ORDER — MUPIROCIN 2 % EX OINT: TOPICAL_OINTMENT | Freq: Every day | CUTANEOUS | Status: DC

## 2012-03-16 MED ORDER — INDIGOTINDISULFONATE SODIUM 8 MG/ML IJ SOLN
INTRAMUSCULAR | Status: AC
  Filled 2012-03-16: qty 5

## 2012-03-16 MED ORDER — ESTRADIOL 0.1 MG/GM VA CREA
TOPICAL_CREAM | VAGINAL | Status: DC | PRN
  Administered 2012-03-16: 1 via VAGINAL

## 2012-03-16 MED ORDER — HYDROMORPHONE HCL PF 1 MG/ML IJ SOLN
0.2500 mg | INTRAMUSCULAR | Status: DC | PRN
  Administered 2012-03-16: 0.25 mg via INTRAVENOUS
  Administered 2012-03-16: 0.5 mg via INTRAVENOUS
  Administered 2012-03-16: 0.25 mg via INTRAVENOUS
  Administered 2012-03-16 (×2): 0.5 mg via INTRAVENOUS

## 2012-03-16 MED ORDER — FLUOXETINE HCL 20 MG PO CAPS
20.0000 mg | ORAL_CAPSULE | Freq: Every day | ORAL | Status: DC
  Administered 2012-03-17: 20 mg via ORAL
  Filled 2012-03-16 (×2): qty 1

## 2012-03-16 MED ORDER — LACTATED RINGERS IR SOLN
Status: DC | PRN
  Administered 2012-03-16: 1

## 2012-03-16 MED ORDER — INDIGOTINDISULFONATE SODIUM 8 MG/ML IJ SOLN
INTRAMUSCULAR | Status: DC | PRN
  Administered 2012-03-16: 4 mL via INTRAVENOUS

## 2012-03-16 MED ORDER — KETOROLAC TROMETHAMINE 30 MG/ML IJ SOLN
INTRAMUSCULAR | Status: AC
  Administered 2012-03-16: 30 mg via INTRAVENOUS
  Filled 2012-03-16: qty 1

## 2012-03-16 MED ORDER — LIDOCAINE HCL (CARDIAC) 20 MG/ML IV SOLN
INTRAVENOUS | Status: AC
  Filled 2012-03-16: qty 5

## 2012-03-16 MED ORDER — LACTATED RINGERS IV SOLN
INTRAVENOUS | Status: DC
  Administered 2012-03-16 – 2012-03-17 (×2): via INTRAVENOUS

## 2012-03-16 MED ORDER — ONDANSETRON HCL 4 MG/2ML IJ SOLN
INTRAMUSCULAR | Status: DC | PRN
  Administered 2012-03-16: 4 mg via INTRAVENOUS

## 2012-03-16 MED ORDER — MIDAZOLAM HCL 5 MG/5ML IJ SOLN
INTRAMUSCULAR | Status: DC | PRN
  Administered 2012-03-16: 2 mg via INTRAVENOUS

## 2012-03-16 MED ORDER — SCOPOLAMINE 1 MG/3DAYS TD PT72: 1.0000 | MEDICATED_PATCH | TRANSDERMAL | Status: DC

## 2012-03-16 MED ORDER — NALOXONE HCL 0.4 MG/ML IJ SOLN: 0.4000 mg | INTRAMUSCULAR | Status: DC | PRN

## 2012-03-16 MED ORDER — GLYCOPYRROLATE 0.2 MG/ML IJ SOLN
INTRAMUSCULAR | Status: DC | PRN
  Administered 2012-03-16: 0.2 mg via INTRAVENOUS
  Administered 2012-03-16: 0.4 mg via INTRAVENOUS

## 2012-03-16 MED ORDER — LACTATED RINGERS IV SOLN
INTRAVENOUS | Status: DC
Start: 2012-03-16 — End: 2012-03-16
  Administered 2012-03-16: 12:00:00 via INTRAVENOUS
  Administered 2012-03-16: 50 mL/h via INTRAVENOUS
  Administered 2012-03-16: 10:00:00 via INTRAVENOUS

## 2012-03-16 MED ORDER — VASOPRESSIN 20 UNIT/ML IJ SOLN
INTRAMUSCULAR | Status: AC
  Filled 2012-03-16: qty 1

## 2012-03-16 MED ORDER — NEOSTIGMINE METHYLSULFATE 1 MG/ML IJ SOLN
INTRAMUSCULAR | Status: DC | PRN
  Administered 2012-03-16: 3 mg via INTRAVENOUS

## 2012-03-16 MED ORDER — HYDROMORPHONE HCL PF 1 MG/ML IJ SOLN
INTRAMUSCULAR | Status: AC
  Administered 2012-03-16: 0.25 mg via INTRAVENOUS
  Filled 2012-03-16: qty 1

## 2012-03-16 MED ORDER — MIDAZOLAM HCL 2 MG/2ML IJ SOLN
INTRAMUSCULAR | Status: AC
  Filled 2012-03-16: qty 2

## 2012-03-16 MED ORDER — FENTANYL CITRATE 0.05 MG/ML IJ SOLN
INTRAMUSCULAR | Status: AC
  Filled 2012-03-16: qty 5

## 2012-03-16 MED ORDER — PROPOFOL 10 MG/ML IV EMUL
INTRAVENOUS | Status: DC | PRN
  Administered 2012-03-16: 150 mg via INTRAVENOUS

## 2012-03-16 MED ORDER — CEFAZOLIN SODIUM-DEXTROSE 2-3 GM-% IV SOLR
INTRAVENOUS | Status: DC | PRN
  Administered 2012-03-16: 2 g via INTRAVENOUS

## 2012-03-16 MED ORDER — DEXAMETHASONE SODIUM PHOSPHATE 10 MG/ML IJ SOLN
INTRAMUSCULAR | Status: AC
  Filled 2012-03-16: qty 1

## 2012-03-16 MED ORDER — GLYCOPYRROLATE 0.2 MG/ML IJ SOLN
INTRAMUSCULAR | Status: AC
  Filled 2012-03-16: qty 3

## 2012-03-16 MED ORDER — ESTRADIOL 0.1 MG/GM VA CREA
TOPICAL_CREAM | VAGINAL | Status: AC
Start: 2012-03-16 — End: 2012-03-16
  Filled 2012-03-16: qty 42.5

## 2012-03-16 MED ORDER — NEOSTIGMINE METHYLSULFATE 1 MG/ML IJ SOLN
INTRAMUSCULAR | Status: AC
  Filled 2012-03-16: qty 10

## 2012-03-16 MED ORDER — PROPOFOL 10 MG/ML IV EMUL
INTRAVENOUS | Status: AC
  Filled 2012-03-16: qty 20

## 2012-03-16 MED ORDER — MENTHOL 3 MG MT LOZG: 1.0000 | LOZENGE | OROMUCOSAL | Status: DC | PRN

## 2012-03-16 MED ORDER — LIDOCAINE HCL (CARDIAC) 20 MG/ML IV SOLN
INTRAVENOUS | Status: DC | PRN
  Administered 2012-03-16: 50 mg via INTRAVENOUS

## 2012-03-16 MED ORDER — HYDROMORPHONE HCL PF 1 MG/ML IJ SOLN
INTRAMUSCULAR | Status: AC
  Administered 2012-03-16: 0.5 mg via INTRAVENOUS
  Filled 2012-03-16: qty 1

## 2012-03-16 MED ORDER — FENTANYL CITRATE 0.05 MG/ML IJ SOLN
INTRAMUSCULAR | Status: DC | PRN
  Administered 2012-03-16: 100 ug via INTRAVENOUS
  Administered 2012-03-16 (×3): 50 ug via INTRAVENOUS

## 2012-03-16 MED ORDER — STERILE WATER FOR IRRIGATION IR SOLN
Status: DC | PRN
  Administered 2012-03-16 (×2): 1 via INTRAVESICAL

## 2012-03-16 SURGICAL SUPPLY — 48 items
ABLATOR ENDOMETRIAL BIPOLAR (ABLATOR) ×4 IMPLANT
BLADE SURG 10 STRL SS (BLADE) ×4 IMPLANT
BLADE SURG 11 STRL SS (BLADE) ×4 IMPLANT
BLADE SURG 15 STRL LF C SS BP (BLADE) ×3 IMPLANT
BLADE SURG 15 STRL SS (BLADE) ×1
CANISTER SUCTION 2500CC (MISCELLANEOUS) ×4 IMPLANT
CATH FOLEY 2WAY SLVR  5CC 18FR (CATHETERS) ×1
CATH FOLEY 2WAY SLVR 5CC 18FR (CATHETERS) ×3 IMPLANT
CATH ROBINSON RED A/P 16FR (CATHETERS) ×4 IMPLANT
CLOTH BEACON ORANGE TIMEOUT ST (SAFETY) ×4 IMPLANT
CONT PATH 16OZ SNAP LID 3702 (MISCELLANEOUS) IMPLANT
CONTAINER PREFILL 10% NBF 60ML (FORM) ×4 IMPLANT
DECANTER SPIKE VIAL GLASS SM (MISCELLANEOUS) ×8 IMPLANT
DERMABOND ADVANCED (GAUZE/BANDAGES/DRESSINGS) ×1
DERMABOND ADVANCED .7 DNX12 (GAUZE/BANDAGES/DRESSINGS) ×3 IMPLANT
DRAPE HYSTEROSCOPY (DRAPE) ×4 IMPLANT
DRAPE PROXIMA HALF (DRAPES) ×4 IMPLANT
DRAPE STERI URO 9X17 APER PCH (DRAPES) ×4 IMPLANT
DRESSING TELFA 8X3 (GAUZE/BANDAGES/DRESSINGS) ×4 IMPLANT
GAUZE PACKING 2X5 YD STERILE (GAUZE/BANDAGES/DRESSINGS) ×4 IMPLANT
GAUZE SPONGE 4X4 16PLY XRAY LF (GAUZE/BANDAGES/DRESSINGS) ×12 IMPLANT
GLOVE BIO SURGEON STRL SZ7.5 (GLOVE) ×8 IMPLANT
GLOVE BIOGEL PI IND STRL 7.5 (GLOVE) ×3 IMPLANT
GLOVE BIOGEL PI INDICATOR 7.5 (GLOVE) ×1
GOWN PREVENTION PLUS LG XLONG (DISPOSABLE) ×16 IMPLANT
GOWN STRL REIN XL XLG (GOWN DISPOSABLE) ×16 IMPLANT
NEEDLE HYPO 22GX1.5 SAFETY (NEEDLE) ×4 IMPLANT
NEEDLE SPNL 22GX3.5 QUINCKE BK (NEEDLE) ×4 IMPLANT
NS IRRIG 1000ML POUR BTL (IV SOLUTION) ×4 IMPLANT
PACK VAGINAL WOMENS (CUSTOM PROCEDURE TRAY) ×4 IMPLANT
PAD OB MATERNITY 4.3X12.25 (PERSONAL CARE ITEMS) ×4 IMPLANT
PAD PREP 24X48 CUFFED NSTRL (MISCELLANEOUS) ×4 IMPLANT
SET CYSTO W/LG BORE CLAMP LF (SET/KITS/TRAYS/PACK) ×4 IMPLANT
SLING TRANS VAGINAL TAPE (Sling) ×1 IMPLANT
SLING UTERINE/ABD GYNECARE TVT (Sling) ×3 IMPLANT
SUT MNCRL AB 3-0 PS2 27 (SUTURE) IMPLANT
SUT MNCRL AB 4-0 PS2 18 (SUTURE) IMPLANT
SUT VIC AB 0 CT1 18XCR BRD8 (SUTURE) ×3 IMPLANT
SUT VIC AB 0 CT1 27 (SUTURE) ×1
SUT VIC AB 0 CT1 27XBRD ANBCTR (SUTURE) ×3 IMPLANT
SUT VIC AB 0 CT1 8-18 (SUTURE) ×1
SUT VIC AB 2-0 SH 27 (SUTURE) ×8
SUT VIC AB 2-0 SH 27XBRD (SUTURE) ×24 IMPLANT
SUT VIC AB 3-0 SH 27 (SUTURE) ×3
SUT VIC AB 3-0 SH 27X BRD (SUTURE) ×9 IMPLANT
SYR TB 1ML 25GX5/8 (SYRINGE) ×8 IMPLANT
TOWEL OR 17X24 6PK STRL BLUE (TOWEL DISPOSABLE) ×8 IMPLANT
TUBING HYDROFLEX HYSTEROSCOPY (TUBING) ×4 IMPLANT

## 2012-03-16 NOTE — Interval H&P Note (Signed)
History and Physical Interval Note:  03/16/2012 9:17 AM  Kayla Jimenez  has presented today for surgery, with the diagnosis of SUI, Cystocele, Menorrhagia  The various methods of treatment have been discussed with the patient and family. After consideration of risks, benefits and other options for treatment, the patient has consented to  Procedure(s) (LRB) with comments: TRANSVAGINAL TAPE (TVT) PROCEDURE (N/A) - 2.5 hours ANTERIOR (CYSTOCELE) AND POSS POSTERIOR REPAIR (RECTOCELE) (N/A) - anterior repair/cysto DILATATION & CURETTAGE/HYSTEROSCOPY WITH NOVASURE ABLATION (N/A) AND CYSTROSCOPY - Novasure Preference as a surgical intervention .  The patient's history has been reviewed, patient examined, no change in status, stable for surgery.  I have reviewed the patient's chart and labs.  Questions were answered to the patient's satisfaction.     Purcell Nails

## 2012-03-16 NOTE — Anesthesia Postprocedure Evaluation (Signed)
Anesthesia Post Note  Patient: Kayla Jimenez  Procedure(s) Performed: Procedure(s) (LRB): TRANSVAGINAL TAPE (TVT) PROCEDURE (N/A) ANTERIOR (CYSTOCELE) AND POSTERIOR REPAIR (RECTOCELE) (N/A) DILATATION & CURETTAGE/HYSTEROSCOPY WITH NOVASURE ABLATION (N/A) CYSTOSCOPY (N/A)  Anesthesia type: General  Patient location: PACU  Post pain: Pain level controlled  Post assessment: Post-op Vital signs reviewed  Last Vitals:  Filed Vitals:   03/16/12 1315  BP: 112/74  Pulse: 93  Temp:   Resp: 15    Post vital signs: Reviewed  Level of consciousness: sedated  Complications: No apparent anesthesia complications

## 2012-03-16 NOTE — Anesthesia Preprocedure Evaluation (Addendum)
Anesthesia Evaluation  Patient identified by MRN, date of birth, ID band Patient awake    Reviewed: Allergy & Precautions, H&P , NPO status , Patient's Chart, lab work & pertinent test results, reviewed documented beta blocker date and time   History of Anesthesia Complications (+) PONV  Airway Mallampati: III TM Distance: >3 FB Neck ROM: full    Dental  (+) Teeth Intact   Pulmonary neg pulmonary ROS,  breath sounds clear to auscultation  Pulmonary exam normal       Cardiovascular negative cardio ROS  Rhythm:regular Rate:Normal     Neuro/Psych PSYCHIATRIC DISORDERS Anxiety Depression negative neurological ROS     GI/Hepatic negative GI ROS, Neg liver ROS,   Endo/Other  negative endocrine ROS  Renal/GU negative Renal ROS  negative genitourinary   Musculoskeletal   Abdominal Normal abdominal exam  (+)   Peds  Hematology negative hematology ROS (+)   Anesthesia Other Findings   Reproductive/Obstetrics negative OB ROS                          Anesthesia Physical Anesthesia Plan  ASA: I  Anesthesia Plan: General ETT   Post-op Pain Management:    Induction:   Airway Management Planned:   Additional Equipment:   Intra-op Plan:   Post-operative Plan:   Informed Consent: I have reviewed the patients History and Physical, chart, labs and discussed the procedure including the risks, benefits and alternatives for the proposed anesthesia with the patient or authorized representative who has indicated his/her understanding and acceptance.   Dental Advisory Given  Plan Discussed with: CRNA and Surgeon  Anesthesia Plan Comments:        Anesthesia Quick Evaluation

## 2012-03-16 NOTE — H&P (View-Only) (Signed)
Kayla Jimenez is a 36 y.o. female G2P2002 who presents for placement of tension free vaginal tape, removal of Mirena IUD and endometrial ablation because stress urinary incontinence and menorrhagia.  Since the birth of her last child in 2011 patient has noticed worsening incontinence when she laughs, sneezes or lifts. She denies urgency, dysuria or frequency though she may have all of these symptoms when she has a renal stone (last occurrence 2012).  As for her menstrual flow, it has gotten heavier in spite of her having a Mirena IUD.  Her flow lasts  for 10 days with a pad change four times a day. She has some cramping (rated 6/10 on a 10 point pain scale) but finds relief with Pamprin.  She denies intermenstrual  or post-coital bleeding, vaginitis symptoms or changes in bowel movements.  An endometrial biopsy in April of 2013 did not reveal any hyperplasia, atypia or malignancy, GC and CT cultures were negative and TSH and prolactin levels normal.  She has no history of fibroids or endometrial polyps.  After reviewing both medical and surgical options for managing her symptoms, the patient has decided to proceed with placement of tension free vaginal tape, removal of Mirena IUD and endometrial ablation.  Past Medical History  OB History: [id number] SVD: 2003 and 2011 with both infants weighing 8+ pounds  GYN History: menarche    LMP    Contracepton vasectomy  The patient denies history of sexually transmitted disease.  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 FLUoxetine (PROZAC) 20 MG capsule Take 1 capsule (20 mg total) by mouth daily.  30 capsule  10 . nabumetone (RELAFEN) 500 MG tablet Take 500 mg by mouth 2 (two) times daily as needed.        Allergies Allergen Reactions . Sulfonamide Derivatives    REACTION: Eyes Itch   Denies sensitivity to peanuts, shellfish, soy, latex or adhesives.  ROS: Denies headache, vision changes, nasal congestion, dysphagia, tinnitus, dizziness, hoarseness, cough,  chest pain, shortness of breath, nausea, vomiting, diarrhea,constipation,  urinary frequency, urgency  dysuria, hematuria, vaginitis symptoms, pelvic pain, swelling of joints,easy bruising,  myalgias, arthralgias, skin rashes, unexplained weight loss and except as is mentioned in the history of present illness, patient's review of systems is otherwise negative.     Physical Exam    BP 110/70  Pulse 78  Temp 98.8 F (37.1 C) (Oral)  Resp 14  Ht 5\' 3"  (1.6 m)  Wt 149 lb (67.586 kg)  BMI 26.39 kg/m2  LMP 02/19/2012  Neck: supple without masses or thyromegaly Lungs: clear to auscultation Heart: regular rate and rhythm Abdomen: soft, non-tender and no organomegaly Pelvic:EGBUS- wnl; vagina-with small cystocele; uterus-mild descensus, normal size, cervix without lesions or motion tenderness; adnexae-no tenderness or masses Extremities:  no clubbing, cyanosis or edema   Assesment:  Stress Urinary Incontinence  Menorrhagia   Disposition:  A discussion was held with patient regarding the indication for her procedure(s) along with the risks, which include but are not limited to: reaction to anesthesia, damage to adjacent organs, infection,  excessive bleeding, erosion of transvaginal tape and worsening urinary  tract symptoms. Patient verbalized understanding of these risks and has consented to proceed with the placement of tension free vaginal tape, possible anterior repair, removal of Mirena IUD, hysteroscopy, dilatation, curettage and endometrial ablation at [facility], March 16, 2012 at 9:30 a.m.   CSN# [id number]   Yates Weisgerber J. Lowell Guitar, PA-C  for Dr. Woodroe Mode. Su Hilt

## 2012-03-16 NOTE — Transfer of Care (Signed)
Immediate Anesthesia Transfer of Care Note  Patient: Kayla Jimenez  Procedure(s) Performed: Procedure(s) (LRB) with comments: TRANSVAGINAL TAPE (TVT) PROCEDURE (N/A) ANTERIOR (CYSTOCELE) AND POSTERIOR REPAIR (RECTOCELE) (N/A) - anterior repair/cysto DILATATION & CURETTAGE/HYSTEROSCOPY WITH NOVASURE ABLATION (N/A) CYSTOSCOPY (N/A)  Patient Location: PACU  Anesthesia Type:General  Level of Consciousness: awake, sedated and patient cooperative  Airway & Oxygen Therapy: Patient Spontanous Breathing and Patient connected to nasal cannula oxygen  Post-op Assessment: Report given to PACU RN and Post -op Vital signs reviewed and stable  Post vital signs: Reviewed and stable  Complications: No apparent anesthesia complications

## 2012-03-16 NOTE — Op Note (Addendum)
Preop Diagnosis: Stress Urinary Incontinence, Cystocele, Rectocele and Menorrhagia   Postop Diagnosis: Stress Urinary Incontinence, Cystocele, Rectocele and Menorrhagia   Procedure: 1.HYSTEROSCOPY 2.D&C 3.ENDOMETRIAL ABLATION VIA NOVASURE 4.TENSION FREE VAGINAL TAPE (TVT) PROCEDURE 5.ANTERIOR AND POSTERIOR REPAIR (CYSTOCELE) 6.REMOVAL OF MIRENA IUD   Anesthesia: General   Anesthesiologist: Dana Allan, MD   Attending: Purcell Nails, MD   Assistant: Henreitta Leber, PA-C  Findings: 2nd degree uterine prolapse, cystocele and rectocele.  Uterine sound 9cm, Cervical length 4cm, Cavity length 5cm, Cavity width 4.5cm, Power 124 watts, 75secs  Pathology: Endometrial Curettings   Fluids: 2000 cc  UOP: 125 cc  EBL: 200 cc  Complications: None   Procedure: The patient was taken to the operating room after the risks, benefits and alternatives discussed with the patient. The patient verbalized understanding and consent signed and witnessed. The patient was given a spinal per anesthesia and prepped and draped in the normal sterile fashion and Time Out performed per protocol. A bivalve speculum was placed in the patient's vagina and the anterior lip of the cervix was grasped with a single tooth tenaculum.  A paracervical block was administered using a total of 10 cc of 1% lidocaine. Mirena IUD was removed without difficulty.  The uterus sounded to 8 cm. The cervix was dilated for passage of the hysteroscope. The hysteroscope was introduced into the uterine cavity and findings as noted above. Sharp curettage was performed until a gritty texture was noted and currettings sent to pathology. The hysteroscope was reintroduced and no obvious remaining intracavitary lesions were noted. The Novasure instrument was introduced and ablation performed without difficulty. Hysteroscope reintroduced and good ablation results were noted. All instruments were removed.   A weighted speculum was then placed in  the patient's vagina and the anterior vaginal wall was injected with dilute pitressin at a concentration of 20 units of pitressin in a total of 50cc of normal saline.  An incision was made in the anterior wall of the vagina for approximately 1cm beneath the midurethra and the underlying tissue was dissected away from the anterior vaginal wall down to the level of the lower symphysis pubis bilaterally. Attention was then turned to the mons pubis where two 5 mm incisions were made 2 fingerbreadths from the midline. The transabdominal guide was then passed through the mons pubis incision on the patient's right down through the space of Retzius and out through the anterior vaginal wall after deflecting the rigid urethral catheter guide to the ipsilateral side. The same was done on the contralateral side. Cystoscopy was performed and no invadvertant bladder injury was noted. The bladder was drained with a Foley while deflecting the rigid urethral catheter guide to the patient's right and the mesh was attached to the transabdominal guide and elevated up through the space of Retzius and out through the incision on the mons pubis on the ipsilateral side. The same was done on the contralateral side. Cystoscopy was performed again and no inadvertant bladder injury was noted. The 49 French Foley was left in the urethra and a large Tresa Endo was placed between the urethra and the mesh in order to leave the mesh slack beneath the midurethra. The mesh was then cut flush with the skin at the mons pubis incisions bilaterally. Indigo carmine had been administered, cystoscopy was performed again and bilateral ureters were noted to efflux without difficulty. The bilateral incisions on the mons pubis were then cleaned and Dermabond applied.   Dilute pitressin was administered overlying the cystocele. An incision was  made and the underlying tissue dissected away from the anterior vaginal wall. The cystocele was repaired using Kelly  plication stitches of 3-0 Vicryl.  The overlying anterior vaginal wall was then repaired using 2-0 Vicryl via interrupted stitches. Attention was then turned to the posterior vaginal wall where dilute Pitressin was injected. The posterior vaginal wall was incised and the underlying tissue was dissected away from the posterior vaginal wall. The rectocele was repaired using plication stitches of 2-0 Vicryl.  The overlying posterior vaginal wall tissue was repaired with 2-0 Vicryl via a running interlocking stitch. The perineum was repaired with 3-0 Vicryl via subcutaneous stitch.  The vagina was packed with estrogen-soaked packing.  The patient tolerated the procedure well and was awaiting return to the recovery room in good condition.  Vagina was packed with estrogen soaked vaginal packing.  Sponge, lap and needle count was correct.  The patient tolerated the procedure well and was returned to the recovery room in good condition.

## 2012-03-16 NOTE — Progress Notes (Signed)
Day of Surgery Procedure(s) (LRB): TRANSVAGINAL TAPE (TVT) PROCEDURE (N/A) ANTERIOR (CYSTOCELE) AND POSTERIOR REPAIR (RECTOCELE) (N/A) DILATATION & CURETTAGE/HYSTEROSCOPY WITH NOVASURE ABLATION (N/A) CYSTOSCOPY (N/A)  Subjective: Patient reports no complaints.    Objective: I have reviewed patient's vital signs and intake and output. UOP 1600cc/6hrs General: alert, no distress and no light headedness or dizziness Resp: clear to auscultation bilaterally Cardio: regular rate and rhythm GI: soft, app tender, dec BS, ND Extremities: Homans sign is negative, no sign of DVT and no edema, redness or tenderness in the calves or thighs Vaginal Bleeding: packing in place  Assessment: s/p Procedure(s) (LRB) with comments: TRANSVAGINAL TAPE (TVT) PROCEDURE (N/A) ANTERIOR (CYSTOCELE) AND POSTERIOR REPAIR (RECTOCELE) (N/A) - anterior repair/cysto DILATATION & CURETTAGE/HYSTEROSCOPY WITH NOVASURE ABLATION (N/A) CYSTOSCOPY (N/A): stable  Plan: cbc in am Cont foley to gravity UOP is good Encourage IS SCDs are on Cont post op care  LOS: 0 days    Ender Rorke Y 03/16/2012, 10:53 PM

## 2012-03-17 LAB — CBC
Hemoglobin: 11.4 g/dL — ABNORMAL LOW (ref 12.0–15.0)
MCH: 32 pg (ref 26.0–34.0)
Platelets: 261 10*3/uL (ref 150–400)
RBC: 3.56 MIL/uL — ABNORMAL LOW (ref 3.87–5.11)

## 2012-03-17 MED ORDER — ONDANSETRON HCL 4 MG PO TABS: 4.0000 mg | ORAL_TABLET | Freq: Three times a day (TID) | ORAL | Status: DC | PRN

## 2012-03-17 MED ORDER — OXYCODONE-ACETAMINOPHEN 5-325 MG PO TABS: 1.0000 | ORAL_TABLET | ORAL | Status: DC | PRN

## 2012-03-17 MED ORDER — INFLUENZA VIRUS VACC SPLIT PF IM SUSP
0.5000 mL | Freq: Once | INTRAMUSCULAR | Status: AC
  Administered 2012-03-17: 0.5 mL via INTRAMUSCULAR

## 2012-03-17 MED ORDER — IBUPROFEN 600 MG PO TABS: 600.0000 mg | ORAL_TABLET | Freq: Four times a day (QID) | ORAL | Status: DC | PRN

## 2012-03-17 MED ORDER — IBUPROFEN 600 MG PO TABS: ORAL_TABLET | ORAL | Status: DC

## 2012-03-17 MED ORDER — CIPROFLOXACIN HCL 250 MG PO TABS: 250.0000 mg | ORAL_TABLET | Freq: Two times a day (BID) | ORAL | Status: DC

## 2012-03-17 MED ORDER — CIPROFLOXACIN HCL 500 MG PO TABS: 500.0000 mg | ORAL_TABLET | Freq: Two times a day (BID) | ORAL | Status: DC

## 2012-03-17 NOTE — Progress Notes (Addendum)
Kayla Jimenez is a36 y.o.  [id number]  Post Op Date # 1  Subjective: Patient is Doing well postoperatively. Patient has minimal pain., has tolerated a regular diet, ambulating without difficulty, passed flatus but has not voided yet.  Objective: Vital signs in last 24 hours: Temp:  [97.6 F (36.4 C)-98.4 F (36.9 C)] 98.3 F (36.8 C) (12/11 0539) Pulse Rate:  [66-100] 69  (12/11 0539) Resp:  [12-18] 14  (12/11 0535) BP: (87-118)/(51-78) 105/63 mmHg (12/11 0539) SpO2:  [95 %-100 %] 100 % (12/11 0539) Weight:  [149 lb (67.586 kg)] 149 lb (67.586 kg) (12/10 1352)  Intake/Output from previous day: 12/10 0701 - 12/11 0700 In: 4246.7 [P.O.:480; I.V.:3766.7] Out: 4050 [Urine:3850] Intake/Output this shift:    Lab 03/17/12 0522 03/16/12 1235 03/16/12 0809  WBC 18.7* 17.7* 12.5*  HGB 11.4* 11.5* 12.4  HCT 34.0* 33.6* 35.9*  PLT 261 244 200    No results found for this basename: NA:3,K:3,CL:3,CO2:3,BUN:3,CREATININE:3,CALCIUM:3,LABALBU:3,PROT:3,BILITOT:3,ALKPHOS:3,ALT:3,AST:3,GLUCOSE:3 in the last 168 hours  EXAM: General: alert, cooperative and no distress Resp: clear to auscultation bilaterally Cardio: RRR GI: soft, non-tender; bowel sounds normal; no masses,  no organomegaly Extremities: no calf tenderness, negative Homan's sign Vaginal Bleeding: none Mons incisiions without evidence of infection   Assessment: s/p Procedure(s): TRANSVAGINAL TAPE (TVT) PROCEDURE ANTERIOR (CYSTOCELE) AND POSTERIOR REPAIR (RECTOCELE) DILATATION & CURETTAGE/HYSTEROSCOPY WITH NOVASURE ABLATION CYSTOSCOPY: stable and progressing well  Plan: Once patient voids and has good pain control with oral analgesia, will be discharged home.  LOS: 1 day    POWELL,ELMIRA, PA-C 03/17/2012 7:34 AM  Pt seen at lunchtime.  She had voided several times.  She reports feeling like she completely emptied on 3rd void.  Tolerating po and ambulating without difficulty and denies any heavy bleeding.  She is  ready to go home.  D/C instructions were reviewed.  Pt instructed to take stool softeners for 6wks and d/c with rxs for cipro for prophylaxis, and pain meds.  F/u for postop in 6wks.

## 2012-03-17 NOTE — Anesthesia Postprocedure Evaluation (Signed)
Anesthesia Post Note  Patient: Kayla Jimenez  Procedure(s) Performed: Procedure(s) (LRB): TRANSVAGINAL TAPE (TVT) PROCEDURE (N/A) ANTERIOR (CYSTOCELE) AND POSTERIOR REPAIR (RECTOCELE) (N/A) DILATATION & CURETTAGE/HYSTEROSCOPY WITH NOVASURE ABLATION (N/A) CYSTOSCOPY (N/A)  Anesthesia type: General  Patient location: Mother/Baby  Post pain: Pain level controlled  Post assessment: Post-op Vital signs reviewed  Last Vitals:  Filed Vitals:   03/17/12 0539  BP: 105/63  Pulse: 69  Temp: 36.8 C  Resp:     Post vital signs: Reviewed  Level of consciousness: awake and alert   Complications: No apparent anesthesia complications

## 2012-03-17 NOTE — Addendum Note (Signed)
Addendum  created 03/17/12 0953 by Basem Yannuzzi M Keddrick Wyne, CRNA   Modules edited:Notes Section    

## 2012-03-17 NOTE — Progress Notes (Signed)
Vaginal packing discontinued at 0535... Moderate amt of dark red drainage noted throughout... Pt tolerated well.

## 2012-03-17 NOTE — Discharge Summary (Signed)
  Physician Discharge Summary  Patient ID: Kayla NALE MRN: [id number] DOB/AGE: March 06, 1976 36 y.o.  Admit date: 03/16/2012 Discharge date: 03/17/2012   Discharge Diagnoses: Stress Urinary Incontinence, Menorrhagia and Symptomatic Pelvic Relaxation Active Problems:  * No active hospital problems. *    Operation: Hysteroscopy, Dilatation, Curettage, Novasure Endometrial Ablation, Placement of Tension Free Vaginal Tape, Anterior and Posterior Colprrhaphy.    Discharged Condition: Good  Hospital Course: On the date of admission the patient underwent the aforementioned procedures and tolerated all of them well.  By post operative day #1, the patient had resumed  bowel and bladder function and was therefore ready for discharge home.  Disposition:   Discharge Medications:   [name], [name]  Home Medication Instructions ZHY:[id number]   Printed on:03/17/12 0750  Medication Information                    FLUoxetine (PROZAC) 20 MG capsule Take 1 capsule (20 mg total) by mouth daily.           ibuprofen (ADVIL,MOTRIN) 600 MG tablet 1 po pc q 6 hours x 3 days then prn for pain           ondansetron (ZOFRAN) 4 MG tablet Take 1 tablet (4 mg total) by mouth every 8 (eight) hours as needed for nausea.           oxyCODONE-acetaminophen (PERCOCET/ROXICET) 5-325 MG per tablet Take 1-2 tablets by mouth every 4 (four) hours as needed (moderate to severe pain (when tolerating fluids)).           ciprofloxacin (CIPRO) 500 MG tablet Take 1 tablet (500 mg total) by mouth 2 (two) times daily.              Follow-up:  Dr. Su Hilt on April 27, 2012 at 2 p.m.     SignedHenreitta Leber, PA-C 03/17/2012, 7:50 AM

## 2012-03-17 NOTE — Progress Notes (Signed)
Pt is discharged in the care of husband. Downstairs per ambulatory. Denies any pain, heavy vaginal bleeding.or discomfort. Questions were asked and answered. Understood all discharge instructions. Lapsites are clean and dry. Stable.

## 2012-03-17 NOTE — H&P (Signed)
Kayla Jimenez is a 36 y.o. female G2P2002 who presents for placement of tension free vaginal tape, removal of Mirena IUD and endometrial ablation because stress urinary incontinence and menorrhagia.  Since the birth of her last child in 2011 patient has noticed worsening incontinence when she laughs, sneezes or lifts. She denies urgency, dysuria or frequency though she may have all of these symptoms when she has a renal stone (last occurrence 2012).  As for her menstrual flow, it has gotten heavier in spite of her having a Mirena IUD.  Her flow lasts  for 10 days with a pad change four times a day. She has some cramping (rated 6/10 on a 10 point pain scale) but finds relief with Pamprin.  She denies intermenstrual  or post-coital bleeding, vaginitis symptoms or changes in bowel movements.  An endometrial biopsy in April of 2013 did not reveal any hyperplasia, atypia or malignancy, GC and CT cultures were negative and TSH and prolactin levels normal.  She has no history of fibroids or endometrial polyps.  After reviewing both medical and surgical options for managing her symptoms, the patient has decided to proceed with placement of tension free vaginal tape, removal of Mirena IUD and endometrial ablation.  Past Medical History  OB History: G2P2002 SVD: 2003 and 2011 with both infants weighing 8+ pounds  GYN History: menarche    LMP    Contracepton vasectomy  The patient denies history of sexually transmitted disease.  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 FLUoxetine (PROZAC) 20 MG capsule Take 1 capsule (20 mg total) by mouth daily.  30 capsule  10 . nabumetone (RELAFEN) 500 MG tablet Take 500 mg by mouth 2 (two) times daily as needed.        Allergies Allergen Reactions . Sulfonamide Derivatives    REACTION: Eyes Itch   Denies sensitivity to peanuts, shellfish, soy, latex or adhesives.  ROS: Denies headache, vision changes, nasal congestion, dysphagia, tinnitus, dizziness, hoarseness, cough,  chest pain, shortness of breath, nausea, vomiting, diarrhea,constipation,  urinary frequency, urgency  dysuria, hematuria, vaginitis symptoms, pelvic pain, swelling of joints,easy bruising,  myalgias, arthralgias, skin rashes, unexplained weight loss and except as is mentioned in the history of present illness, patient's review of systems is otherwise negative.     Physical Exam    BP 110/70  Pulse 78  Temp 98.8 F (37.1 C) (Oral)  Resp 14  Ht 5' 3" (1.6 m)  Wt 149 lb (67.586 kg)  BMI 26.39 kg/m2  LMP 02/19/2012  Neck: supple without masses or thyromegaly Lungs: clear to auscultation Heart: regular rate and rhythm Abdomen: soft, non-tender and no organomegaly Pelvic:EGBUS- wnl; vagina-with small cystocele; uterus-mild descensus, normal size, cervix without lesions or motion tenderness; adnexae-no tenderness or masses Extremities:  no clubbing, cyanosis or edema   Assesment:  Stress Urinary Incontinence                          Menorrhagia   Disposition:  A discussion was held with patient regarding the indication for her procedure(s) along with the risks, which include but are not limited to: reaction to anesthesia, damage to adjacent organs, infection,  excessive bleeding, erosion of transvaginal tape and worsening urinary  tract symptoms. Patient verbalized understanding of these risks and has consented to proceed with the placement of tension free vaginal tape, possible anterior repair, removal of Mirena IUD, hysteroscopy, dilatation, curettage and endometrial ablation at [facility], March 16, 2012 at 9:30 a.m.   CSN# [id number]   Kayla Decamp J. Ravneet Spilker, PA-C  for Dr. Angela Y. Roberts  

## 2012-03-19 ENCOUNTER — Encounter (HOSPITAL_COMMUNITY): Payer: Self-pay | Admitting: Obstetrics and Gynecology

## 2012-04-27 ENCOUNTER — Encounter: Payer: BC Managed Care – PPO | Admitting: Obstetrics and Gynecology

## 2012-04-27 ENCOUNTER — Encounter: Payer: Self-pay | Admitting: Obstetrics and Gynecology

## 2012-04-27 ENCOUNTER — Ambulatory Visit: Payer: BC Managed Care – PPO | Admitting: Obstetrics and Gynecology

## 2012-04-27 VITALS — BP 112/70 | Temp 98.9°F | Ht 63.0 in | Wt 149.0 lb

## 2012-04-27 DIAGNOSIS — N390 Urinary tract infection, site not specified: Secondary | ICD-10-CM

## 2012-04-27 DIAGNOSIS — Z09 Encounter for follow-up examination after completed treatment for conditions other than malignant neoplasm: Secondary | ICD-10-CM

## 2012-04-27 DIAGNOSIS — R102 Pelvic and perineal pain: Secondary | ICD-10-CM

## 2012-04-27 DIAGNOSIS — IMO0001 Reserved for inherently not codable concepts without codable children: Secondary | ICD-10-CM

## 2012-04-27 LAB — POCT URINALYSIS DIPSTICK
Bilirubin, UA: NEGATIVE
Ketones, UA: NEGATIVE
Nitrite, UA: NEGATIVE
Protein, UA: NEGATIVE
pH, UA: 6

## 2012-04-27 LAB — POCT URINE PREGNANCY: Preg Test, Ur: NEGATIVE

## 2012-04-27 MED ORDER — CIPROFLOXACIN HCL 250 MG PO TABS: 250.0000 mg | ORAL_TABLET | Freq: Two times a day (BID) | ORAL | Status: AC

## 2012-04-27 NOTE — Progress Notes (Signed)
36 YO S/P Novasure Endometrial Ablation and Placement of TVT on March 16, 2012 returns for post operative visit.  Patient's post operative course was unremarkable with her resuming normal bowel and bladder function within 24 hours of surgery and was therefore discharged home. Since that time patient has complained of lower back and right side pelvic pain-started 1 week ago. Denies fever, problems urinating, changes in bowel function or vaginitis symptoms. Hasn't had any bleeding  O: BP 112/70  Temp 98.9 F (37.2 C) (Oral)  Ht 5\' 3"  (1.6 m)  Wt 149 lb (67.586 kg)  BMI 26.39 kg/m2  LMP 02/19/2012  Abdomen: soft with mild tenderness without guarding over supra pubic area; no CVA tenderness Pelvic: EGBUS-wnl, vagina-normal with healed incisions below urethra and mons, cervix-no lesions, uterus/adnexae-no tenderness or masses  U/A:  SG-1.020, pH-6, 2+ Leukocytes UPT-patient declined  A: S/P Novasure Ablation & TVT (03/16/2013     H/O SUI     H/O Menorrhagia     UTI  P: Cipro 250 mg  #14 bid x 7 days no refills      Urine for culture; increase hydration (patient doesn''t drink much fluid-only 6 ounces since a.m.)      Return to work tomorrow, continue to avoid heavy lifting for full six weeks      RTO-as scheduled or prn  Acquanetta Cabanilla, PA-C

## 2012-04-27 NOTE — Progress Notes (Addendum)
Surgery: TVT;ANT REPAIR AND ABLATION  Date: 03-16-12  Eating a regular diet without difficulty. Bowel movements are normal.  Pain is controlled with current analgesics. Medications being used: ibuprofen (OTC).  Bladder function is returned to normal. Vaginal bleeding: none Vaginal discharge: no vaginal discharge   PT C/O PAIN ON RIGHT SIDE AND LOWER BACK

## 2012-04-29 LAB — URINE CULTURE: Organism ID, Bacteria: NO GROWTH

## 2013-03-17 ENCOUNTER — Other Ambulatory Visit: Payer: Self-pay | Admitting: Obstetrics and Gynecology

## 2013-03-17 DIAGNOSIS — Z9882 Breast implant status: Secondary | ICD-10-CM

## 2013-03-17 DIAGNOSIS — N6320 Unspecified lump in the left breast, unspecified quadrant: Secondary | ICD-10-CM

## 2013-03-24 ENCOUNTER — Ambulatory Visit
Admission: RE | Admit: 2013-03-24 | Discharge: 2013-03-24 | Disposition: A | Discharge status: Home / Self Care | Payer: BC Managed Care – PPO | Source: Ambulatory Visit | Attending: Obstetrics and Gynecology | Admitting: Obstetrics and Gynecology

## 2013-03-24 DIAGNOSIS — N6320 Unspecified lump in the left breast, unspecified quadrant: Secondary | ICD-10-CM

## 2013-03-24 DIAGNOSIS — Z9882 Breast implant status: Secondary | ICD-10-CM

## 2013-08-23 ENCOUNTER — Other Ambulatory Visit: Payer: Self-pay | Admitting: Obstetrics and Gynecology

## 2013-08-23 DIAGNOSIS — N6320 Unspecified lump in the left breast, unspecified quadrant: Secondary | ICD-10-CM

## 2013-09-06 ENCOUNTER — Encounter (INDEPENDENT_AMBULATORY_CARE_PROVIDER_SITE_OTHER): Payer: Self-pay

## 2013-09-06 ENCOUNTER — Ambulatory Visit
Admission: RE | Admit: 2013-09-06 | Discharge: 2013-09-06 | Disposition: A | Discharge status: Home / Self Care | Payer: BC Managed Care – PPO | Source: Ambulatory Visit | Attending: Obstetrics and Gynecology | Admitting: Obstetrics and Gynecology

## 2013-09-06 DIAGNOSIS — N6320 Unspecified lump in the left breast, unspecified quadrant: Secondary | ICD-10-CM

## 2013-11-02 ENCOUNTER — Ambulatory Visit (INDEPENDENT_AMBULATORY_CARE_PROVIDER_SITE_OTHER): Payer: BC Managed Care – PPO | Admitting: Podiatry

## 2013-11-02 ENCOUNTER — Ambulatory Visit (INDEPENDENT_AMBULATORY_CARE_PROVIDER_SITE_OTHER): Payer: BC Managed Care – PPO

## 2013-11-02 ENCOUNTER — Encounter: Payer: Self-pay | Admitting: Podiatry

## 2013-11-02 ENCOUNTER — Other Ambulatory Visit: Payer: Self-pay | Admitting: *Deleted

## 2013-11-02 VITALS — Ht 63.0 in | Wt 140.0 lb

## 2013-11-02 DIAGNOSIS — M722 Plantar fascial fibromatosis: Secondary | ICD-10-CM

## 2013-11-02 DIAGNOSIS — M779 Enthesopathy, unspecified: Secondary | ICD-10-CM

## 2013-11-02 DIAGNOSIS — M778 Other enthesopathies, not elsewhere classified: Secondary | ICD-10-CM

## 2013-11-02 MED ORDER — METHYLPREDNISOLONE (PAK) 4 MG PO TABS: ORAL_TABLET | ORAL | Status: DC

## 2013-11-02 MED ORDER — MELOXICAM 15 MG PO TABS: 15.0000 mg | ORAL_TABLET | Freq: Every day | ORAL | Status: DC

## 2013-11-02 NOTE — Patient Instructions (Signed)
Plantar Fasciitis (Heel Spur Syndrome) with Rehab The plantar fascia is a fibrous, ligament-like, soft-tissue structure that spans the bottom of the foot. Plantar fasciitis is a condition that causes pain in the foot due to inflammation of the tissue. SYMPTOMS   Pain and tenderness on the underneath side of the foot.  Pain that worsens with standing or walking. CAUSES  Plantar fasciitis is caused by irritation and injury to the plantar fascia on the underneath side of the foot. Common mechanisms of injury include:  Direct trauma to bottom of the foot.  Damage to a small nerve that runs under the foot where the main fascia attaches to the heel bone.  Stress placed on the plantar fascia due to bone spurs. RISK INCREASES WITH:   Activities that place stress on the plantar fascia (running, jumping, pivoting, or cutting).  Poor strength and flexibility.  Improperly fitted shoes.  Tight calf muscles.  Flat feet.  Failure to warm-up properly before activity.  Obesity. PREVENTION  Warm up and stretch properly before activity.  Allow for adequate recovery between workouts.  Maintain physical fitness:  Strength, flexibility, and endurance.  Cardiovascular fitness.  Maintain a health body weight.  Avoid stress on the plantar fascia.  Wear properly fitted shoes, including arch supports for individuals who have flat feet. PROGNOSIS  If treated properly, then the symptoms of plantar fasciitis usually resolve without surgery. However, occasionally surgery is necessary. RELATED COMPLICATIONS   Recurrent symptoms that may result in a chronic condition.  Problems of the lower back that are caused by compensating for the injury, such as limping.  Pain or weakness of the foot during push-off following surgery.  Chronic inflammation, scarring, and partial or complete fascia tear, occurring more often from repeated injections. TREATMENT  Treatment initially involves the use of  ice and medication to help reduce pain and inflammation. The use of strengthening and stretching exercises may help reduce pain with activity, especially stretches of the Achilles tendon. These exercises may be performed at home or with a therapist. Your caregiver may recommend that you use heel cups of arch supports to help reduce stress on the plantar fascia. Occasionally, corticosteroid injections are given to reduce inflammation. If symptoms persist for greater than 6 months despite non-surgical (conservative), then surgery may be recommended.  MEDICATION   If pain medication is necessary, then nonsteroidal anti-inflammatory medications, such as aspirin and ibuprofen, or other minor pain relievers, such as acetaminophen, are often recommended.  Do not take pain medication within 7 days before surgery.  Prescription pain relievers may be given if deemed necessary by your caregiver. Use only as directed and only as much as you need.  Corticosteroid injections may be given by your caregiver. These injections should be reserved for the most serious cases, because they may only be given a certain number of times. HEAT AND COLD  Cold treatment (icing) relieves pain and reduces inflammation. Cold treatment should be applied for 10 to 15 minutes every 2 to 3 hours for inflammation and pain and immediately after any activity that aggravates your symptoms. Use ice packs or massage the area with a piece of ice (ice massage).  Heat treatment may be used prior to performing the stretching and strengthening activities prescribed by your caregiver, physical therapist, or athletic trainer. Use a heat pack or soak the injury in warm water. SEEK IMMEDIATE MEDICAL CARE IF:  Treatment seems to offer no benefit, or the condition worsens.  Any medications produce adverse side effects. EXERCISES RANGE   OF MOTION (ROM) AND STRETCHING EXERCISES - Plantar Fasciitis (Heel Spur Syndrome) These exercises may help you  when beginning to rehabilitate your injury. Your symptoms may resolve with or without further involvement from your physician, physical therapist or athletic trainer. While completing these exercises, remember:   Restoring tissue flexibility helps normal motion to return to the joints. This allows healthier, less painful movement and activity.  An effective stretch should be held for at least 30 seconds.  A stretch should never be painful. You should only feel a gentle lengthening or release in the stretched tissue. RANGE OF MOTION - Toe Extension, Flexion  Sit with your right / left leg crossed over your opposite knee.  Grasp your toes and gently pull them back toward the top of your foot. You should feel a stretch on the bottom of your toes and/or foot.  Hold this stretch for __________ seconds.  Now, gently pull your toes toward the bottom of your foot. You should feel a stretch on the top of your toes and or foot.  Hold this stretch for __________ seconds. Repeat __________ times. Complete this stretch __________ times per day.  RANGE OF MOTION - Ankle Dorsiflexion, Active Assisted  Remove shoes and sit on a chair that is preferably not on a carpeted surface.  Place right / left foot under knee. Extend your opposite leg for support.  Keeping your heel down, slide your right / left foot back toward the chair until you feel a stretch at your ankle or calf. If you do not feel a stretch, slide your bottom forward to the edge of the chair, while still keeping your heel down.  Hold this stretch for __________ seconds. Repeat __________ times. Complete this stretch __________ times per day.  STRETCH - Gastroc, Standing  Place hands on wall.  Extend right / left leg, keeping the front knee somewhat bent.  Slightly point your toes inward on your back foot.  Keeping your right / left heel on the floor and your knee straight, shift your weight toward the wall, not allowing your back to  arch.  You should feel a gentle stretch in the right / left calf. Hold this position for __________ seconds. Repeat __________ times. Complete this stretch __________ times per day. STRETCH - Soleus, Standing  Place hands on wall.  Extend right / left leg, keeping the other knee somewhat bent.  Slightly point your toes inward on your back foot.  Keep your right / left heel on the floor, bend your back knee, and slightly shift your weight over the back leg so that you feel a gentle stretch deep in your back calf.  Hold this position for __________ seconds. Repeat __________ times. Complete this stretch __________ times per day. STRETCH - Gastrocsoleus, Standing  Note: This exercise can place a lot of stress on your foot and ankle. Please complete this exercise only if specifically instructed by your caregiver.   Place the ball of your right / left foot on a step, keeping your other foot firmly on the same step.  Hold on to the wall or a rail for balance.  Slowly lift your other foot, allowing your body weight to press your heel down over the edge of the step.  You should feel a stretch in your right / left calf.  Hold this position for __________ seconds.  Repeat this exercise with a slight bend in your right / left knee. Repeat __________ times. Complete this stretch __________ times per day.    STRENGTHENING EXERCISES - Plantar Fasciitis (Heel Spur Syndrome)  These exercises may help you when beginning to rehabilitate your injury. They may resolve your symptoms with or without further involvement from your physician, physical therapist or athletic trainer. While completing these exercises, remember:   Muscles can gain both the endurance and the strength needed for everyday activities through controlled exercises.  Complete these exercises as instructed by your physician, physical therapist or athletic trainer. Progress the resistance and repetitions only as guided. STRENGTH -  Towel Curls  Sit in a chair positioned on a non-carpeted surface.  Place your foot on a towel, keeping your heel on the floor.  Pull the towel toward your heel by only curling your toes. Keep your heel on the floor.  If instructed by your physician, physical therapist or athletic trainer, add ____________________ at the end of the towel. Repeat __________ times. Complete this exercise __________ times per day. STRENGTH - Ankle Inversion  Secure one end of a rubber exercise band/tubing to a fixed object (table, pole). Loop the other end around your foot just before your toes.  Place your fists between your knees. This will focus your strengthening at your ankle.  Slowly, pull your big toe up and in, making sure the band/tubing is positioned to resist the entire motion.  Hold this position for __________ seconds.  Have your muscles resist the band/tubing as it slowly pulls your foot back to the starting position. Repeat __________ times. Complete this exercises __________ times per day.  Document Released: 03/24/2005 Document Revised: 06/16/2011 Document Reviewed: 07/06/2008 [employer] Patient Information 2015 [employer]. This information is not intended to replace advice given to you by your health care provider. Make sure you discuss any questions you have with your health care provider.  

## 2013-11-03 NOTE — Progress Notes (Signed)
She presents today with a chief complaint of pain to her right heel and pain to her second knuckle on her right foot she states of the forefoot seems to be worse than heel and she continues to wear her orthotics on a regular basis.  Objective: Pulses are strongly palpable. She has pain on in range of motion of the second metatarsophalangeal joint of the right foot and pain on palpation to the area. She has a short first metatarsal and a long second metatarsal resulting in this capsulitis. She also has pain on palpation medial continued tubercle of the right heel.  Assessment: Capsulitis second metatarsophalangeal joint of the right foot plantar fasciitis right foot.  Plan: Discussed etiology pathology conservative versus surgical therapies at this point we started her on Medrol Dosepak to be followed by meloxicam. An intra-articular injection of dexamethasone to the second metatarsophalangeal joint after a sterile Betadine skin prep. Injected the right heel with Kenalog at the point of maximal tenderness. She received a plantar fascial brace and a night splint. She will continue to wear the appropriate shoe gear we discussed etiology pathology conservative versus surgical therapies. We discussed appropriate shoe gear stretching exercises ice therapy and shoe gear modifications. I will followup with her in one month

## 2013-11-30 ENCOUNTER — Ambulatory Visit: Payer: BC Managed Care – PPO | Admitting: Podiatry

## 2013-12-07 ENCOUNTER — Ambulatory Visit (INDEPENDENT_AMBULATORY_CARE_PROVIDER_SITE_OTHER): Payer: BC Managed Care – PPO | Admitting: Podiatry

## 2013-12-07 ENCOUNTER — Encounter: Payer: Self-pay | Admitting: Podiatry

## 2013-12-07 VITALS — BP 127/81 | HR 88 | Resp 16

## 2013-12-07 DIAGNOSIS — M79609 Pain in unspecified limb: Secondary | ICD-10-CM

## 2013-12-07 DIAGNOSIS — M7750 Other enthesopathy of unspecified foot: Secondary | ICD-10-CM

## 2013-12-07 DIAGNOSIS — M779 Enthesopathy, unspecified: Secondary | ICD-10-CM

## 2013-12-07 DIAGNOSIS — M722 Plantar fascial fibromatosis: Secondary | ICD-10-CM

## 2013-12-07 DIAGNOSIS — M778 Other enthesopathies, not elsewhere classified: Secondary | ICD-10-CM

## 2013-12-07 MED ORDER — DICLOFENAC SODIUM 75 MG PO TBEC: 75.0000 mg | DELAYED_RELEASE_TABLET | Freq: Two times a day (BID) | ORAL | Status: DC

## 2013-12-07 NOTE — Progress Notes (Signed)
She presents today for followup of her plantar fasciitis of her right foot and her capsulitis of her second metatarsophalangeal joint right foot. She states that she has had vertigo for the past 3 weeks. She continues all of her conservative therapies including her meloxicam. She states that she's approximately 80% better it seems to be getting better on a regular basis.  Objective: Vital signs are stable she is alert and oriented x3 she has decrease in swelling and edema around the second metatarsophalangeal joint of her right calcaneus at the plantar fascial calcaneal insertion site does appear to be painful.  Assessment: Resolving plantar fasciitis mild capsulitis second metatarsophalangeal joint right.  Plan: At this point I discontinued her meloxicam simply because I was concerned about her vertigo. I started her on diclofenac one tablet twice daily. I also dispensed a carbon graphite insole.

## 2014-01-09 ENCOUNTER — Ambulatory Visit: Payer: BC Managed Care – PPO | Admitting: Podiatry

## 2014-02-06 ENCOUNTER — Encounter: Payer: Self-pay | Admitting: Podiatry

## 2014-03-08 ENCOUNTER — Other Ambulatory Visit: Payer: Self-pay | Admitting: Obstetrics and Gynecology

## 2014-03-08 DIAGNOSIS — Z9889 Other specified postprocedural states: Secondary | ICD-10-CM

## 2014-03-08 DIAGNOSIS — N6320 Unspecified lump in the left breast, unspecified quadrant: Secondary | ICD-10-CM

## 2014-03-17 ENCOUNTER — Ambulatory Visit
Admission: RE | Admit: 2014-03-17 | Discharge: 2014-03-17 | Disposition: A | Discharge status: Home / Self Care | Payer: BC Managed Care – PPO | Source: Ambulatory Visit | Attending: Obstetrics and Gynecology | Admitting: Obstetrics and Gynecology

## 2014-03-17 ENCOUNTER — Other Ambulatory Visit: Payer: Self-pay | Admitting: Obstetrics and Gynecology

## 2014-03-17 DIAGNOSIS — Z9889 Other specified postprocedural states: Secondary | ICD-10-CM

## 2014-03-17 DIAGNOSIS — N6320 Unspecified lump in the left breast, unspecified quadrant: Secondary | ICD-10-CM

## 2014-05-10 ENCOUNTER — Ambulatory Visit: Payer: Self-pay | Admitting: Hematology and Oncology

## 2014-05-10 LAB — CBC CANCER CENTER
Basophil: 2 %
Comment - H1-Com1: NORMAL
HCT: 38.5 % (ref 35.0–47.0)
HGB: 13.3 g/dL (ref 12.0–16.0)
Lymphocytes: 21 %
MCH: 32.3 pg (ref 26.0–34.0)
MCHC: 34.6 g/dL (ref 32.0–36.0)
MCV: 93 fL (ref 80–100)
Monocytes: 5 %
Platelet: 267 x10 3/mm (ref 150–440)
RBC: 4.12 10*6/uL (ref 3.80–5.20)
RDW: 11.6 % (ref 11.5–14.5)
Segmented Neutrophils: 72 %
WBC: 10 x10 3/mm (ref 3.6–11.0)

## 2014-05-10 LAB — URIC ACID: Uric Acid: 3.7 mg/dL (ref 2.6–6.0)

## 2014-05-10 LAB — LACTATE DEHYDROGENASE: LDH: 146 U/L (ref 81–246)

## 2014-05-10 LAB — SEDIMENTATION RATE: Erythrocyte Sed Rate: 30 mm/hr — ABNORMAL HIGH (ref 0–20)

## 2014-05-23 ENCOUNTER — Emergency Department: Payer: Self-pay | Admitting: Emergency Medicine

## 2014-06-05 ENCOUNTER — Ambulatory Visit: Payer: Self-pay

## 2014-06-06 ENCOUNTER — Ambulatory Visit
Admit: 2014-06-06 | Disposition: A | Discharge status: Home / Self Care | Payer: Self-pay | Attending: Hematology and Oncology | Admitting: Hematology and Oncology

## 2014-06-13 ENCOUNTER — Ambulatory Visit (INDEPENDENT_AMBULATORY_CARE_PROVIDER_SITE_OTHER): Payer: BC Managed Care – PPO | Admitting: General Surgery

## 2014-06-13 ENCOUNTER — Other Ambulatory Visit: Payer: Self-pay | Admitting: General Surgery

## 2014-06-13 ENCOUNTER — Encounter: Payer: Self-pay | Admitting: General Surgery

## 2014-06-13 VITALS — BP 122/68 | HR 74 | Resp 12 | Ht 63.0 in | Wt 146.0 lb

## 2014-06-13 DIAGNOSIS — K8020 Calculus of gallbladder without cholecystitis without obstruction: Secondary | ICD-10-CM | POA: Diagnosis not present

## 2014-06-13 DIAGNOSIS — N289 Disorder of kidney and ureter, unspecified: Secondary | ICD-10-CM | POA: Diagnosis not present

## 2014-06-13 NOTE — Progress Notes (Signed)
Patient ID: Kayla Jimenez, female   DOB: 1975/06/10, 39 y.o.   MRN: [id number]  Chief Complaint  Patient presents with  . Other    gallstones    HPI Kayla Jimenez is a 39 y.o. female here today for a evalaution of gallstones. Patient states she has been having abdominal pain for about a month. Right upper quadrant pain that radiates to her back and between the shoulder blades, as well as to the top of the right scapula. Eating makes it worse, occasionally resulting in diarrhea. Greasy/fatty foods are most likely to make her symptomatic. She was seen in the ER last month for a suspected kidney stone. Patient had a CT scan on 05/1914 and ultrasound done on 06/05/14. She passed 2 kidney stones on Monday 06/12/14. She has had a marked improvement in her left flank pain since the stones past.   HPI  Past Medical History  Diagnosis Date  . Yeast infection   . H/O varicella   . H/O mumps   . H/O bladder infections   . BV (bacterial vaginosis) 08/29/10  . Anxiety 08/29/10  . Post - coital bleeding 08/29/10  . Menometrorrhagia 06/26/10  . PMS (premenstrual syndrome) 07/10/11  . H/O toxoplasmosis   . Complication of anesthesia 2004    vomited  . Infections of kidney     several kidney stones, bladder infections    Past Surgical History  Procedure Laterality Date  . Tonsillectomy    . Breast reduction surgery  6/04  . Bladder suspension  03/16/2012    Procedure: TRANSVAGINAL TAPE (TVT) PROCEDURE;  Surgeon: [name], MD;  Location: [facility];  Service: Gynecology;  Laterality: N/A;  . Anterior and posterior repair  03/16/2012    Procedure: ANTERIOR (CYSTOCELE) AND POSTERIOR REPAIR (RECTOCELE);  Surgeon: [name], MD;  Location: [facility];  Service: Gynecology;  Laterality: N/A;  anterior repair/cysto  . Dilitation & currettage/hystroscopy with novasure ablation  03/16/2012    Procedure: DILATATION & CURETTAGE/HYSTEROSCOPY WITH NOVASURE ABLATION;  Surgeon: [name], MD;   Location: [facility];  Service: Gynecology;  Laterality: N/A;  . Cystoscopy  03/16/2012    Procedure: CYSTOSCOPY;  Surgeon: [name], MD;  Location: [facility];  Service: Gynecology;  Laterality: N/A;    Family History  Problem Relation Age of Onset  . Cancer Mother     cervical  . Depression Mother   . Drug abuse Mother   . Anemia Mother     blood transfusion  . Migraines Mother   . Cancer Other     breast    Social History History  Substance Use Topics  . Smoking status: Never Smoker   . Smokeless tobacco: Never Used  . Alcohol Use: No    Allergies  Allergen Reactions  . Sulfa Antibiotics Itching  . Sulfonamide Derivatives     REACTION: Eyes Itch    Current Outpatient Prescriptions  Medication Sig Dispense Refill  . amoxicillin-clavulanate (AUGMENTIN) 875-125 MG per tablet   0  . cetirizine (ZYRTEC) 10 MG tablet Take 10 mg by mouth daily.    . diclofenac (VOLTAREN) 75 MG EC tablet Take 1 tablet (75 mg total) by mouth 2 (two) times daily. 60 tablet 3  . DULoxetine (CYMBALTA) 20 MG capsule   0  . ibuprofen (ADVIL,MOTRIN) 600 MG tablet 1 po pc q 6 hours x 3 days then prn for pain 30 tablet 1  . meclizine (ANTIVERT) 12.5 MG tablet Take 12.5 mg by mouth  3 (three) times daily as needed for dizziness.     No current facility-administered medications for this visit.    Review of Systems Review of Systems  Constitutional: Negative.   Respiratory: Negative.   Cardiovascular: Negative.     Blood pressure 122/68, pulse 74, resp. rate 12, height 5\' 3"  (1.6 m), weight 146 lb (66.225 kg), last menstrual period 06/05/2014.  Physical Exam Physical Exam  Constitutional: She is oriented to person, place, and time. She appears well-developed and well-nourished.  Neck: Neck supple. No thyromegaly present.  Cardiovascular: Normal rate, regular rhythm and normal heart sounds.   No murmur heard. Pulmonary/Chest: Effort normal and breath sounds normal.  Abdominal: Soft. Normal  appearance and bowel sounds are normal. There is no hepatosplenomegaly. There is no tenderness. No hernia.  Lymphadenopathy:    She has no cervical adenopathy.  Neurological: She is alert and oriented to person, place, and time.  Skin: Skin is warm and dry.    Data Reviewed PCP notes of 06/08/2014 completed by Leretha Pol, nurse practitioner were reviewed.  CT scan of the abdomen and pelvis dated 05/23/2014 was reviewed. Partially obstructing left renal calculus noted at the left ureterovesical junction. Moderate hydronephrosis. Perinephric stranding.  A single small radio opaque gallstone was identified is identified without evidence of cholecystitis.  Renal ultrasound dated 06/05/2014 did not show residual hydronephrosis.  Laboratory studies dated 05/09/2014 showed a hemoglobin of 13.6 with an MCV of 95, white blood cell count of 15,800 with 82% polys and 12% lymphocytes. Rheumatoid factor within normal limits. A N/A was negative. Uric acid was normal. Sedimentation rate was normal at 15.  Assessment    Symptomatic cholelithiasis.    Plan    The pros and cons of elective cholecystectomy were reviewed. The plans for laparoscopic procedure but the possibility of an open procedure were reviewed. The possibility of postprandial diarrhea was discussed.    Patient is scheduled for surgery at [facility] on 06/28/14. She will pre admit by phone. Patient is aware of date and instructions.  PCP/Ref: Vanetta Shawl 06/13/2014, 5:33 PM

## 2014-06-13 NOTE — Patient Instructions (Addendum)

## 2014-06-28 ENCOUNTER — Encounter: Payer: Self-pay | Admitting: General Surgery

## 2014-06-28 ENCOUNTER — Ambulatory Visit: Payer: Self-pay | Admitting: General Surgery

## 2014-06-28 DIAGNOSIS — K8010 Calculus of gallbladder with chronic cholecystitis without obstruction: Secondary | ICD-10-CM | POA: Diagnosis not present

## 2014-06-28 HISTORY — PX: CHOLECYSTECTOMY: SHX55

## 2014-06-29 ENCOUNTER — Encounter: Payer: Self-pay | Admitting: General Surgery

## 2014-07-03 ENCOUNTER — Encounter: Payer: Self-pay | Admitting: General Surgery

## 2014-07-05 ENCOUNTER — Ambulatory Visit (INDEPENDENT_AMBULATORY_CARE_PROVIDER_SITE_OTHER): Payer: BC Managed Care – PPO | Admitting: General Surgery

## 2014-07-05 ENCOUNTER — Encounter: Payer: Self-pay | Admitting: General Surgery

## 2014-07-05 VITALS — BP 116/58 | HR 74 | Resp 12 | Ht 63.0 in | Wt 147.0 lb

## 2014-07-05 DIAGNOSIS — K8020 Calculus of gallbladder without cholecystitis without obstruction: Secondary | ICD-10-CM

## 2014-07-05 NOTE — Patient Instructions (Signed)
Patient to return in three weeks.  

## 2014-07-05 NOTE — Progress Notes (Signed)
Patient ID: Kayla Jimenez, female   DOB: [birth year], 39 y.o.   MRN: [id number]  Chief Complaint  Patient presents with  . Routine Post Op    gallbladder    HPI Kayla Jimenez is a 39 y.o. female here today for her post op gallbladder surgery done on 06/28/14. The patient's recovery post cholecystectomy has been somewhat slower than expected. She's had some occasional right upper quadrant pain. A few days ago she had pain after eating chocolate cake. Nausea but no vomiting. The pains near the right midaxillary line and extends down in the right lower quadrant. She also had pain prior to her second bowel movement after surgery a few days ago. Patient states she has been dizziness for the last two days. Not associated with any upper respiratory symptoms. This is been mild and transient.  HPI  Past Medical History  Diagnosis Date  . Yeast infection   . H/O varicella   . H/O mumps   . H/O bladder infections   . BV (bacterial vaginosis) 08/29/10  . Anxiety 08/29/10  . Post - coital bleeding 08/29/10  . Menometrorrhagia 06/26/10  . PMS (premenstrual syndrome) 07/10/11  . H/O toxoplasmosis   . Complication of anesthesia 2004    vomited  . Infections of kidney     several kidney stones, bladder infections    Past Surgical History  Procedure Laterality Date  . Tonsillectomy    . Breast reduction surgery  6/04  . Bladder suspension  03/16/2012    Procedure: TRANSVAGINAL TAPE (TVT) PROCEDURE;  Surgeon: [name], MD;  Location: [facility];  Service: Gynecology;  Laterality: N/A;  . Anterior and posterior repair  03/16/2012    Procedure: ANTERIOR (CYSTOCELE) AND POSTERIOR REPAIR (RECTOCELE);  Surgeon: [name], MD;  Location: [facility];  Service: Gynecology;  Laterality: N/A;  anterior repair/cysto  . Dilitation & currettage/hystroscopy with novasure ablation  03/16/2012    Procedure: DILATATION & CURETTAGE/HYSTEROSCOPY WITH NOVASURE ABLATION;  Surgeon: [name], MD;  Location: [facility];  Service: Gynecology;  Laterality: N/A;  . Cystoscopy  03/16/2012    Procedure: CYSTOSCOPY;  Surgeon: [name], MD;  Location: [facility];  Service: Gynecology;  Laterality: N/A;  . Cholecystectomy  06/28/14    Family History  Problem Relation Age of Onset  . Cancer Mother     cervical  . Depression Mother   . Drug abuse Mother   . Anemia Mother     blood transfusion  . Migraines Mother   . Cancer Other     breast    Social History History  Substance Use Topics  . Smoking status: Never Smoker   . Smokeless tobacco: Never Used  . Alcohol Use: No    Allergies  Allergen Reactions  . Sulfa Antibiotics Itching  . Sulfonamide Derivatives     REACTION: Eyes Itch    Current Outpatient Prescriptions  Medication Sig Dispense Refill  . cetirizine (ZYRTEC) 10 MG tablet Take 10 mg by mouth daily.    . diclofenac (VOLTAREN) 75 MG EC tablet Take 1 tablet (75 mg total) by mouth 2 (two) times daily. 60 tablet 3  . DULoxetine (CYMBALTA) 20 MG capsule   0  . HYDROcodone-acetaminophen (NORCO/VICODIN) 5-325 MG per tablet   0  . ibuprofen (ADVIL,MOTRIN) 600 MG tablet 1 po pc q 6 hours x 3 days then prn for pain 30 tablet 1  . meclizine (ANTIVERT) 12.5 MG tablet Take 12.5 mg by mouth 3 (three) times daily  as needed for dizziness.     No current facility-administered medications for this visit.    Review of Systems Review of Systems  Constitutional: Negative.   Respiratory: Negative.   Cardiovascular: Negative.     Blood pressure 116/58, pulse 74, resp. rate 12, height 5\' 3"  (1.6 m), weight 147 lb (66.679 kg), last menstrual period 06/05/2014.  Physical Exam Physical Exam  Constitutional: She is oriented to person, place, and time. She appears well-developed and well-nourished.  Cardiovascular: Normal rate, regular rhythm and normal heart sounds.   Pulmonary/Chest: Effort normal and breath sounds normal.  Abdominal: Soft. Normal appearance and bowel sounds are normal.     Port sits are clean and healing well.  Neurological: She is alert and oriented to person, place, and time.  Skin: Skin is warm and dry.    Data Reviewed Pathology showed chronic cholecystitis and cholelithiasis. Normal cholangiograms.  Assessment    Slow recovery post cholecystectomy.    Plan    The patient has been encouraged to continue use of local heat. Increase her diet as tolerated. Patient to return in three weeks if she is not back to baseline. If she's doing well she can call and cancel.      PCP:  Vanetta Shawl 07/06/2014, 11:36 AM

## 2014-07-10 ENCOUNTER — Encounter: Payer: Self-pay | Admitting: General Surgery

## 2014-07-10 ENCOUNTER — Telehealth: Payer: Self-pay | Admitting: General Surgery

## 2014-07-10 ENCOUNTER — Ambulatory Visit: Admit: 2014-07-10 | Disposition: A | Discharge status: Home / Self Care | Payer: Self-pay | Admitting: General Surgery

## 2014-07-10 ENCOUNTER — Ambulatory Visit (INDEPENDENT_AMBULATORY_CARE_PROVIDER_SITE_OTHER): Payer: BC Managed Care – PPO | Admitting: General Surgery

## 2014-07-10 VITALS — Ht 63.0 in | Wt 148.0 lb

## 2014-07-10 DIAGNOSIS — R1084 Generalized abdominal pain: Secondary | ICD-10-CM

## 2014-07-10 DIAGNOSIS — R140 Abdominal distension (gaseous): Secondary | ICD-10-CM

## 2014-07-10 NOTE — Patient Instructions (Signed)
Take Milk of Magnesia 2 ounces tonight.

## 2014-07-10 NOTE — Telephone Encounter (Signed)
PATIENT CALLED TODAY STATING SHE SPOKE WITH DR BYRNETT Friday 07-07-14. SINCE THEN SHE HASN'T BEEN ABLE TO HAVE A BOWEL MOVEMENT SINCE 07-06-14,WITH BLOATING AND PAIN ON RT SIDE OF BACK.IF A PHARMACY IS NEEDED SHE USES [facility].

## 2014-07-10 NOTE — Progress Notes (Signed)
Patient ID: Kayla Jimenez, female   DOB: 1976-02-16, 39 y.o.   MRN: [id number]  Chief Complaint  Patient presents with  . Follow-up    bloating and constipation post op    HPI Kayla Jimenez is a 39 y.o. female  Here for complaints of bloating, belching, and constipation post gallbladder surgery done on 06/28/14. The patient had been making use of Gas-X when we spoke on 07/07/2014. This has not improved her sense of comfort. She last bowel movement her was on  07/06/14 and was described as urgent. Formed to start, loose at the end. She has not had a bowel movement since then. She also reports some pain in her right flank area that has not changes since before her surgery. She denies any fevers. The patient reports that she had intermittently had chills prior to her surgery, and these have persisted.  The patient did pass a left ureteral stone in March, 2016. HPI  Past Medical History  Diagnosis Date  . Yeast infection   . H/O varicella   . H/O mumps   . H/O bladder infections   . BV (bacterial vaginosis) 08/29/10  . Anxiety 08/29/10  . Post - coital bleeding 08/29/10  . Menometrorrhagia 06/26/10  . PMS (premenstrual syndrome) 07/10/11  . H/O toxoplasmosis   . Complication of anesthesia 2004    vomited  . Infections of kidney     several kidney stones, bladder infections    Past Surgical History  Procedure Laterality Date  . Tonsillectomy    . Breast reduction surgery  6/04  . Bladder suspension  03/16/2012    Procedure: TRANSVAGINAL TAPE (TVT) PROCEDURE;  Surgeon: [name], MD;  Location: [facility];  Service: Gynecology;  Laterality: N/A;  . Anterior and posterior repair  03/16/2012    Procedure: ANTERIOR (CYSTOCELE) AND POSTERIOR REPAIR (RECTOCELE);  Surgeon: [name], MD;  Location: [facility];  Service: Gynecology;  Laterality: N/A;  anterior repair/cysto  . Dilitation & currettage/hystroscopy with novasure ablation  03/16/2012    Procedure: DILATATION &  CURETTAGE/HYSTEROSCOPY WITH NOVASURE ABLATION;  Surgeon: [name], MD;  Location: [facility];  Service: Gynecology;  Laterality: N/A;  . Cystoscopy  03/16/2012    Procedure: CYSTOSCOPY;  Surgeon: [name], MD;  Location: [facility];  Service: Gynecology;  Laterality: N/A;  . Cholecystectomy  06/28/14    Family History  Problem Relation Age of Onset  . Cancer Mother     cervical  . Depression Mother   . Drug abuse Mother   . Anemia Mother     blood transfusion  . Migraines Mother   . Cancer Other     breast    Social History History  Substance Use Topics  . Smoking status: Never Smoker   . Smokeless tobacco: Never Used  . Alcohol Use: No    Allergies  Allergen Reactions  . Sulfa Antibiotics Itching  . Sulfonamide Derivatives     REACTION: Eyes Itch    Current Outpatient Prescriptions  Medication Sig Dispense Refill  . cetirizine (ZYRTEC) 10 MG tablet Take 10 mg by mouth daily.    . diclofenac (VOLTAREN) 75 MG EC tablet Take 1 tablet (75 mg total) by mouth 2 (two) times daily. 60 tablet 3  . docusate sodium (COLACE) 100 MG capsule Take 100 mg by mouth 2 (two) times daily as needed for mild constipation.    . DULoxetine (CYMBALTA) 20 MG capsule   0  . HYDROcodone-acetaminophen (NORCO/VICODIN) 5-325 MG  per tablet   0  . ibuprofen (ADVIL,MOTRIN) 600 MG tablet 1 po pc q 6 hours x 3 days then prn for pain 30 tablet 1  . meclizine (ANTIVERT) 12.5 MG tablet Take 12.5 mg by mouth 3 (three) times daily as needed for dizziness.     No current facility-administered medications for this visit.    Review of Systems Review of Systems  Constitutional: Negative.   Respiratory: Negative.   Cardiovascular: Negative.   Gastrointestinal: Positive for abdominal pain, constipation and abdominal distention. Negative for nausea, vomiting, diarrhea, blood in stool, anal bleeding and rectal pain.    Height 5\' 3"  (1.6 m), weight 148 lb (67.132 kg), last menstrual period  06/29/2014.  Physical Exam Physical Exam  Constitutional: She is oriented to person, place, and time. She appears well-developed and well-nourished.  Cardiovascular: Normal rate and regular rhythm.   Pulmonary/Chest: Effort normal and breath sounds normal.  The patient reports a deep breathing produces abdominal discomfort. Breath sounds are clear bilaterally. Lower lobes clear.  Abdominal: Bowel sounds are normal.    Bowel sounds are normal. Moderate bloating. No discernible mass.  Neurological: She is alert and oriented to person, place, and time.    Data Reviewed Pathology showed chronic cholecystitis and cholelithiasis.  Cholangiograms showed no obstruction of flow. Questionable tiny distal filling defect on my review.  Operative note reported no unusual adhesions, although the duodenum did require gentle mobilization as it was abutting the neck of the gallbladder.  Assessment    Postprandial bloating, abdominal discomfort post cholecystectomy.    Plan    We'll obtain a CBC incompetence of metabolic pounds today as well as arrange for plain films of the abdomen to look for an occult source for her symptoms.    PCP: Vanetta Shawl 07/10/2014, 4:15 PM

## 2014-07-11 ENCOUNTER — Telehealth: Payer: Self-pay | Admitting: General Surgery

## 2014-07-11 LAB — CBC WITH DIFFERENTIAL/PLATELET
BASOS ABS: 0.1 10*3/uL (ref 0.0–0.2)
Basos: 1 %
EOS ABS: 0.5 10*3/uL — AB (ref 0.0–0.4)
Eos: 4 %
HEMATOCRIT: 37.6 % (ref 34.0–46.6)
Hemoglobin: 12.5 g/dL (ref 11.1–15.9)
Immature Grans (Abs): 0 10*3/uL (ref 0.0–0.1)
Immature Granulocytes: 0 %
LYMPHS ABS: 3 10*3/uL (ref 0.7–3.1)
Lymphs: 26 %
MCH: 31.6 pg (ref 26.6–33.0)
MCHC: 33.2 g/dL (ref 31.5–35.7)
MCV: 95 fL (ref 79–97)
Monocytes Absolute: 0.8 10*3/uL (ref 0.1–0.9)
Monocytes: 7 %
NEUTROS ABS: 6.9 10*3/uL (ref 1.4–7.0)
Neutrophils Relative %: 62 %
PLATELETS: 367 10*3/uL (ref 150–379)
RBC: 3.95 x10E6/uL (ref 3.77–5.28)
RDW: 11.5 % — AB (ref 12.3–15.4)
WBC: 11.2 10*3/uL — ABNORMAL HIGH (ref 3.4–10.8)

## 2014-07-11 LAB — COMPREHENSIVE METABOLIC PANEL
A/G RATIO: 1.8 (ref 1.1–2.5)
ALK PHOS: 70 IU/L (ref 39–117)
ALT: 24 IU/L (ref 0–32)
AST: 16 IU/L (ref 0–40)
Albumin: 4.2 g/dL (ref 3.5–5.5)
BUN/Creatinine Ratio: 15 (ref 8–20)
BUN: 11 mg/dL (ref 6–20)
Bilirubin Total: <0.2 mg/dL (ref 0.0–1.2)
CO2: 23 mmol/L (ref 18–29)
Calcium: 9.5 mg/dL (ref 8.7–10.2)
Chloride: 99 mmol/L (ref 97–108)
Creatinine, Ser: 0.71 mg/dL (ref 0.57–1.00)
GFR, EST AFRICAN AMERICAN: 125 mL/min/{1.73_m2} (ref >59)
GFR, EST NON AFRICAN AMERICAN: 108 mL/min/{1.73_m2} (ref >59)
GLOBULIN, TOTAL: 2.4 g/dL (ref 1.5–4.5)
Glucose: 96 mg/dL (ref 65–99)
POTASSIUM: 4.6 mmol/L (ref 3.5–5.2)
Sodium: 139 mmol/L (ref 134–144)
TOTAL PROTEIN: 6.6 g/dL (ref 6.0–8.5)

## 2014-07-11 NOTE — Telephone Encounter (Signed)
Patient asked to call back for lab and radiology results. (All normal).

## 2014-07-12 ENCOUNTER — Telehealth: Payer: Self-pay | Admitting: *Deleted

## 2014-07-12 ENCOUNTER — Encounter: Payer: Self-pay | Admitting: General Surgery

## 2014-07-12 NOTE — Telephone Encounter (Signed)
Pt called and wanted to let you know that the Selz has not worked yet and today has been 1 week since she has been to the bathroom, she is also calling wanting to know her results from the Xray and labs

## 2014-07-12 NOTE — Telephone Encounter (Signed)
The patient has had no results from the magnesia taken on April 4. Laboratory studies were normal. Plain films the abdomen were unremarkable. She reports continued abdominal bloating. Fullness with meals.  We'll have her contact the nurse tomorrow for instructions on use of a fleets enema in the afternoon and at bedtime and initiate MiraLAX 1 capful with a full glass of water twice a day. 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

## 2014-07-13 ENCOUNTER — Telehealth: Payer: Self-pay | Admitting: *Deleted

## 2014-07-13 ENCOUNTER — Encounter: Payer: Self-pay | Admitting: *Deleted

## 2014-07-13 NOTE — Telephone Encounter (Signed)
-----   Message from Robert Bellow, MD sent at 07/12/2014  9:47 PM EDT ----- Please arrange for the patient have a UGI/SBFT Friday morning in regards to post prandial pain and bloating. She should still do the enema, but put the MiraLAX on hold.

## 2014-07-13 NOTE — Telephone Encounter (Signed)
Patient has been scheduled for an UGI/SBFT at [facility] for 07-14-14 at 10:30 am (arrive 10 am). Prep: NPO after midnight. This patient will need to check-in at the [facility] registration desk.

## 2014-07-13 NOTE — Telephone Encounter (Signed)
Notified patient as instructed, patient agrees. Discussed UGI/SBFT for tomorrow, patient agrees

## 2014-07-14 ENCOUNTER — Ambulatory Visit
Admit: 2014-07-14 | Disposition: A | Discharge status: Home / Self Care | Payer: Self-pay | Attending: General Surgery | Admitting: General Surgery

## 2014-07-15 ENCOUNTER — Telehealth: Payer: Self-pay | Admitting: General Surgery

## 2014-07-15 NOTE — Telephone Encounter (Signed)
The patient had been seen during her UGI/SBFT. Early films had shown no evidence of residual thickening of the jejunal mucosa. Barium past the colon into and a half hours. There was a decreased stool volume compared to plain films obtained earlier in the week. She had reported that she had a great relief from her discomfort after the Fleet's enema used on April 7.  She was asked to make use of one capsule of MiraLAX daily.  I've requested to give a phone follow-up next week with her progress.

## 2014-07-17 ENCOUNTER — Encounter: Payer: Self-pay | Admitting: General Surgery

## 2014-07-20 ENCOUNTER — Telehealth: Payer: Self-pay | Admitting: General Surgery

## 2014-07-20 NOTE — Telephone Encounter (Signed)
PT CALLED IN TODAY & WANTED TO CK IF U STILL WANTED TO SEE HER NEXT WK ON 07-25-14? SHE STATES SHE IS FEELING MUCH BETTER SINCE LAST WK.

## 2014-07-21 NOTE — Telephone Encounter (Signed)
A message was left for the patient that if she is feeling well she does not need to come for the previously scheduled appointment on April 19. We'll plan to cancel her appointment unless something changes.

## 2014-07-24 ENCOUNTER — Telehealth: Payer: Self-pay | Admitting: General Surgery

## 2014-07-24 NOTE — Telephone Encounter (Signed)
PT RETURNED YOUR CALL AND SHE IS STILL DOING WELL.I CX'D HER APPT FOR 07-25-14. SHE WANTED TO KNOW IF SHE COULD GO BACK TO EXERCISING OTHER THEN WALKING?

## 2014-07-24 NOTE — Telephone Encounter (Signed)
The patient may resume activities as tolerated. She was encouraged to begin an exercise program gradually. She'll call if any further problems.

## 2014-07-25 ENCOUNTER — Ambulatory Visit: Payer: BC Managed Care – PPO | Admitting: General Surgery

## 2014-07-31 LAB — SURGICAL PATHOLOGY

## 2014-08-06 NOTE — Op Note (Signed)
PATIENT NAME:  Kayla Jimenez, Kayla Jimenez MR#:  [id number] DATE OF BIRTH:  08/21/1975  DATE OF PROCEDURE:  06/28/2014  PREOPERATIVE DIAGNOSIS: Chronic cholecystitis and cholelithiasis.   POSTOPERATIVE DIAGNOSIS: Chronic cholecystitis and cholelithiasis.  OPERATIVE PROCEDURE: Laparoscopic cholecystectomy with intraoperative cholangiograms.   SURGEON: Robert Bellow, MD  ANESTHESIA: General endotracheal under Dr. Andree Elk.   ESTIMATED BLOOD LOSS: Less than 5 mL.   CLINICAL NOTE: This 39 year old woman has had upper abdominal pain and ultrasound showed evidence of cholelithiasis. She was felt to be a candidate for elective cholecystectomy.   OPERATIVE NOTE: With the patient under adequate general endotracheal anesthesia, the abdomen was prepped with ChloraPrep and draped. In Trendelenburg position, a Veress needle was placed through a transumbilical incision. After assuring intra-abdominal location with the hanging drop test, the abdomen was insufflated with CO2 at 10 mmHg pressure. A 10 mm step port was expanded and inspection showed no evidence of injury from initial port placement. The patient was placed in the reverse Trendelenburg position and rolled to the left. An 11 mm XL port was placed in the epigastrium and two 5 mm step ports placed in the lateral abdominal wall. The gallbladder was placed on cephalad traction. Adhesions between the omentum and the undersurface of the gallbladder were taken down with cautery dissection. The duodenum, which was pulled towards the neck of the gallbladder, was gently swept medially. The neck of the gallbladder was cleared and the cystic duct isolated. A Kumar clamp was placed and fluoroscopic cholangiograms completed with a total of 35 mL of one-half strength Hypaque. There was prompt filling of the right and left hepatic ducts and rapid drainage into the common bile duct and into the duodenum. There was a questionable small filling defect in the distal common bile  duct that did not change with further instillation of contrast, but there was no evidence of a proximal ductal dilatation or impedance flow into the duodenum. The cystic duct and branches of the cystic artery were doubly clipped and divided. The gallbladder was removed from the liver bed from hook cautery dissection. It was delivered through the umbilical port site without incident. The right upper quadrant was irrigated with lactated Ringer solution. Good hemostasis was noted. Inspection from the epigastric site showed no evidence of injury from initial port placement. The abdomen was then desufflated and ports removed under direct vision. The umbilical wound was closed with 0 Vicryl figure-of-eight suture. The skin incisions were closed with 4-0 Vicryl subcuticular sutures. Benzoin, Steri-Strips, Telfa, and Tegaderm dressings were applied. The patient tolerated the procedure well and was taken to the recovery room in stable condition.    ____________________________ Robert Bellow, MD jwb:bm D: 06/28/2014 20:15:32 ET T: 06/29/2014 07:31:32 ET JOB#: [id number]  cc: Robert Bellow, MD, <Dictator> Lavera Guise, MD Keiden Deskin Amedeo Kinsman MD ELECTRONICALLY SIGNED 06/30/2014 11:03

## 2014-09-05 ENCOUNTER — Other Ambulatory Visit: Payer: Self-pay | Admitting: Obstetrics and Gynecology

## 2014-09-05 DIAGNOSIS — N6320 Unspecified lump in the left breast, unspecified quadrant: Secondary | ICD-10-CM

## 2014-09-12 ENCOUNTER — Ambulatory Visit
Admission: RE | Admit: 2014-09-12 | Discharge: 2014-09-12 | Disposition: A | Discharge status: Home / Self Care | Payer: BC Managed Care – PPO | Source: Ambulatory Visit | Attending: Obstetrics and Gynecology | Admitting: Obstetrics and Gynecology

## 2014-09-12 DIAGNOSIS — N6320 Unspecified lump in the left breast, unspecified quadrant: Secondary | ICD-10-CM

## 2014-11-06 LAB — HCG, QUANTITATIVE, PREGNANCY: Beta Hcg, Quant.: <1 m[IU]/mL — ABNORMAL LOW

## 2015-07-06 ENCOUNTER — Encounter: Payer: Self-pay | Admitting: Urology

## 2015-07-06 ENCOUNTER — Ambulatory Visit (INDEPENDENT_AMBULATORY_CARE_PROVIDER_SITE_OTHER): Payer: BC Managed Care – PPO | Admitting: Urology

## 2015-07-06 VITALS — BP 138/95 | HR 108 | Wt 155.4 lb

## 2015-07-06 DIAGNOSIS — Z87442 Personal history of urinary calculi: Secondary | ICD-10-CM

## 2015-07-06 DIAGNOSIS — R310 Gross hematuria: Secondary | ICD-10-CM

## 2015-07-06 DIAGNOSIS — N390 Urinary tract infection, site not specified: Secondary | ICD-10-CM

## 2015-07-06 LAB — URINALYSIS, COMPLETE
Bilirubin, UA: NEGATIVE
Glucose, UA: NEGATIVE
KETONES UA: NEGATIVE
Nitrite, UA: NEGATIVE
Specific Gravity, UA: 1.02 (ref 1.005–1.030)
Urobilinogen, Ur: 0.2 mg/dL (ref 0.2–1.0)
pH, UA: 6.5 (ref 5.0–7.5)

## 2015-07-06 LAB — MICROSCOPIC EXAMINATION: WBC, UA: >30 /hpf — ABNORMAL HIGH (ref 0–?)

## 2015-07-06 MED ORDER — OXYCODONE-ACETAMINOPHEN 10-325 MG PO TABS: 1.0000 | ORAL_TABLET | ORAL | Status: DC | PRN

## 2015-07-06 MED ORDER — HYDROCODONE-ACETAMINOPHEN 5-300 MG PO TABS: 1.0000 | ORAL_TABLET | Freq: Four times a day (QID) | ORAL | Status: DC | PRN

## 2015-07-06 MED ORDER — CIPROFLOXACIN HCL 500 MG PO TABS: 500.0000 mg | ORAL_TABLET | Freq: Two times a day (BID) | ORAL | Status: DC

## 2015-07-06 NOTE — Progress Notes (Signed)
07/06/2015 11:38 AM   Kayla Jimenez 05-Dec-1975 [id number]  Referring provider: Lavera Guise, MD [address] [place], [place] [address]  Chief Complaint  Patient presents with  . Nephrolithiasis  . Recurrent UTI    HPI: Patient is a 40 year old Caucasian female with a history of nephrolithiasis and recurrent urinary tract infections who presents today with symptoms reminiscent of her previous stone.  Patient states on 06/11/2015 she was having urinary frequency, dysuria, nocturia 2 and gross hematuria. She was diagnosed with a UTI and given an antibiotic. She states she found no relief with the antibiotics.  She has not had fevers, chills, nausea or vomiting.  She is having episodes of right-sided flank pain.  They're becoming quite intense at times. Her UA today is suspicious for infection.     PMH: Past Medical History  Diagnosis Date  . Yeast infection   . H/O varicella   . H/O mumps   . H/O bladder infections   . BV (bacterial vaginosis) 08/29/10  . Anxiety 08/29/10  . Post - coital bleeding 08/29/10  . Menometrorrhagia 06/26/10  . PMS (premenstrual syndrome) 07/10/11  . H/O toxoplasmosis   . Complication of anesthesia 2004    vomited  . Infections of kidney     several kidney stones, bladder infections    Surgical History: Past Surgical History  Procedure Laterality Date  . Tonsillectomy    . Breast reduction surgery  6/04  . Bladder suspension  03/16/2012    Procedure: TRANSVAGINAL TAPE (TVT) PROCEDURE;  Surgeon: [name], MD;  Location: [facility];  Service: Gynecology;  Laterality: N/A;  . Anterior and posterior repair  03/16/2012    Procedure: ANTERIOR (CYSTOCELE) AND POSTERIOR REPAIR (RECTOCELE);  Surgeon: [name], MD;  Location: [facility];  Service: Gynecology;  Laterality: N/A;  anterior repair/cysto  . Dilitation & currettage/hystroscopy with novasure ablation  03/16/2012    Procedure: DILATATION & CURETTAGE/HYSTEROSCOPY WITH NOVASURE  ABLATION;  Surgeon: [name], MD;  Location: [facility];  Service: Gynecology;  Laterality: N/A;  . Cystoscopy  03/16/2012    Procedure: CYSTOSCOPY;  Surgeon: [name], MD;  Location: [facility];  Service: Gynecology;  Laterality: N/A;  . Cholecystectomy  06/28/14    Home Medications:    Medication List       This list is accurate as of: 07/06/15 11:38 AM.  Always use your most recent med list.               cetirizine 10 MG tablet  Commonly known as:  ZYRTEC  Take 10 mg by mouth daily.     ciprofloxacin 500 MG tablet  Commonly known as:  CIPRO  Take 1 tablet (500 mg total) by mouth every 12 (twelve) hours.     diclofenac 75 MG EC tablet  Commonly known as:  VOLTAREN  Take 1 tablet (75 mg total) by mouth 2 (two) times daily.     DULoxetine 20 MG capsule  Commonly known as:  CYMBALTA     ibuprofen 600 MG tablet  Commonly known as:  ADVIL,MOTRIN  1 po pc q 6 hours x 3 days then prn for pain     oxyCODONE-acetaminophen 10-325 MG tablet  Commonly known as:  PERCOCET  Take 1 tablet by mouth every 4 (four) hours as needed for pain.        Allergies:  Allergies  Allergen Reactions  . Sulfa Antibiotics Itching  . Sulfonamide Derivatives     REACTION:  Eyes Itch    Family History: Family History  Problem Relation Age of Onset  . Cancer Mother     cervical  . Depression Mother   . Drug abuse Mother   . Anemia Mother     blood transfusion  . Migraines Mother   . Cancer Other     breast    Social History:  reports that she has never smoked. She has never used smokeless tobacco. She reports that she does not drink alcohol or use illicit drugs.  ROS: UROLOGY Frequent Urination?: Yes Hard to postpone urination?: No Burning/pain with urination?: Yes Get up at night to urinate?: Yes Leakage of urine?: No Urine stream starts and stops?: No Trouble starting stream?: No Do you have to strain to urinate?: No Blood in urine?: Yes Urinary tract infection?:  Yes Sexually transmitted disease?: No Injury to kidneys or bladder?: No Painful intercourse?: No Weak stream?: No Currently pregnant?: No Vaginal bleeding?: No Last menstrual period?: 06/25/15  Gastrointestinal Nausea?: No Vomiting?: No Indigestion/heartburn?: No Diarrhea?: No Constipation?: No  Constitutional Fever: No Night sweats?: No Weight loss?: No Fatigue?: No  Skin Skin rash/lesions?: No Itching?: No  Eyes Blurred vision?: No Double vision?: No  Ears/Nose/Throat Sore throat?: No Sinus problems?: Yes  Hematologic/Lymphatic Swollen glands?: No Easy bruising?: No  Cardiovascular Leg swelling?: No Chest pain?: No  Respiratory Cough?: No Shortness of breath?: No  Endocrine Excessive thirst?: No  Musculoskeletal Back pain?: Yes Joint pain?: No  Neurological Headaches?: Yes Dizziness?: No  Psychologic Depression?: Yes Anxiety?: No  Physical Exam: BP 138/95 mmHg  Pulse 108  Wt 155 lb 6.4 oz (70.489 kg)  Constitutional: Well nourished. Alert and oriented, No acute distress. HEENT: [place] AT, moist mucus membranes. Trachea midline, no masses. Cardiovascular: No clubbing, cyanosis, or edema. Respiratory: Normal respiratory effort, no increased work of breathing. GI: Abdomen is soft, non tender, non distended, no abdominal masses. Liver and spleen not palpable.   Skin: No rashes, bruises or suspicious lesions. Lymph: No cervical or inguinal adenopathy. Neurologic: Grossly intact, no focal deficits, moving all 4 extremities. Psychiatric: Normal mood and affect.  Laboratory Data: Lab Results  Component Value Date   WBC 11.2* 07/10/2014   HGB 12.5 07/10/2014   HCT 37.6 07/10/2014   MCV 95 07/10/2014   PLT 367 07/10/2014    Lab Results  Component Value Date   CREATININE 0.71 07/10/2014    Lab Results  Component Value Date   AST 16 07/10/2014   Lab Results  Component Value Date   ALT 24 07/10/2014    Urinalysis Microscopic  Examination  Result Value Ref Range   WBC, UA >30 (H) 0 -  5 /hpf   RBC, UA 11-30 (A) 0 -  2 /hpf   Epithelial Cells (non renal) 0-10 0 - 10 /hpf   Mucus, UA Present (A) Not Estab.   Bacteria, UA Many (A) None seen/Few  Urinalysis, Complete  Result Value Ref Range   Specific Gravity, UA 1.020 1.005 - 1.030   pH, UA 6.5 5.0 - 7.5   Color, UA Yellow Yellow   Appearance Ur Cloudy (A) Clear   Leukocytes, UA 2+ (A) Negative   Protein, UA 1+ (A) Negative/Trace   Glucose, UA Negative Negative   Ketones, UA Negative Negative   RBC, UA 3+ (A) Negative   Bilirubin, UA Negative Negative   Urobilinogen, Ur 0.2 0.2 - 1.0 mg/dL   Nitrite, UA Negative Negative   Microscopic Examination See below:  Assessment & Plan:    1. Urinary tract infection:   UA is suspicious for infection. I will send it for culture. I have empirically started her on Cipro. We'll adjust the antibiotic if appropriate.  - CULTURE, URINE COMPREHENSIVE - Urinalysis, Complete  2. Gross hematuria:   Explained to patient the causes of blood in the urine are as follows: stones,  UTI's, damage to the urinary tract and/or cancer.  It is explained to the patient that they will be scheduled for a CT Urogram with contrast material and that in rare instances, an allergic reaction can be serious and even life threatening with the injection of contrast material.   The patient denies any allergies to contrast, iodine and/or seafood and is not taking metformin.  3. History of stones:   She is having episodes of flank pain. Patient is undergoing a CT urogram for further evaluation.  She is given Vicodin,  #10 to help control the pain while we await the CT urogram results.  Return for CT Urogram report.  These notes generated with voice recognition software. I apologize for typographical errors.  Zara Council, [facility] [address] [place], [place] [address] [phone]

## 2015-07-07 LAB — BUN+CREAT
BUN/Creatinine Ratio: 15 (ref 8–20)
BUN: 10 mg/dL (ref 6–20)
Creatinine, Ser: 0.65 mg/dL (ref 0.57–1.00)
GFR calc Af Amer: 129 mL/min/{1.73_m2} (ref >59)
GFR, EST NON AFRICAN AMERICAN: 112 mL/min/{1.73_m2} (ref >59)

## 2015-07-07 LAB — HCG, SERUM, QUALITATIVE: hCG,Beta Subunit,Qual,Serum: NEGATIVE m[IU]/mL (ref <6)

## 2015-07-08 DIAGNOSIS — N390 Urinary tract infection, site not specified: Secondary | ICD-10-CM

## 2015-07-08 DIAGNOSIS — R310 Gross hematuria: Secondary | ICD-10-CM

## 2015-07-08 DIAGNOSIS — R319 Hematuria, unspecified: Secondary | ICD-10-CM

## 2015-07-08 DIAGNOSIS — Z87442 Personal history of urinary calculi: Secondary | ICD-10-CM | POA: Insufficient documentation

## 2015-07-08 HISTORY — DX: Urinary tract infection, site not specified: N39.0

## 2015-07-08 HISTORY — DX: Gross hematuria: R31.0

## 2015-07-09 ENCOUNTER — Other Ambulatory Visit: Payer: Self-pay

## 2015-07-09 ENCOUNTER — Telehealth: Payer: Self-pay

## 2015-07-09 DIAGNOSIS — R310 Gross hematuria: Secondary | ICD-10-CM

## 2015-07-09 LAB — CULTURE, URINE COMPREHENSIVE

## 2015-07-09 NOTE — Telephone Encounter (Signed)
Spoke with pt in reference to +ucx. Made aware to continue cipro. Pt voiced understanding.

## 2015-07-09 NOTE — Telephone Encounter (Signed)
LMOM

## 2015-07-09 NOTE — Progress Notes (Signed)
Dx had to be changed due to insurance.

## 2015-07-09 NOTE — Telephone Encounter (Signed)
-----   Message from Nori Riis, PA-C sent at 07/09/2015  9:22 AM EDT ----- Patient has an UTI.  It is sensitive to the Cipro.  She has a CT Urogram pending at this time.  We will recheck her urine at that appointment.

## 2015-07-13 ENCOUNTER — Ambulatory Visit: Payer: BC Managed Care – PPO

## 2015-07-16 ENCOUNTER — Ambulatory Visit
Admission: RE | Admit: 2015-07-16 | Discharge: 2015-07-16 | Disposition: A | Discharge status: Home / Self Care | Payer: BC Managed Care – PPO | Source: Ambulatory Visit | Attending: Urology | Admitting: Urology

## 2015-07-16 DIAGNOSIS — R310 Gross hematuria: Secondary | ICD-10-CM | POA: Insufficient documentation

## 2015-07-16 DIAGNOSIS — N200 Calculus of kidney: Secondary | ICD-10-CM | POA: Diagnosis not present

## 2015-07-16 DIAGNOSIS — I7090 Unspecified atherosclerosis: Secondary | ICD-10-CM | POA: Diagnosis not present

## 2015-07-16 HISTORY — DX: Calculus of kidney: N20.0

## 2015-07-16 MED ORDER — IOPAMIDOL (ISOVUE-300) INJECTION 61%
150.0000 mL | Freq: Once | INTRAVENOUS | Status: AC | PRN
  Administered 2015-07-16: 150 mL via INTRAVENOUS

## 2015-07-26 ENCOUNTER — Encounter: Payer: Self-pay | Admitting: Urology

## 2015-07-26 ENCOUNTER — Ambulatory Visit (INDEPENDENT_AMBULATORY_CARE_PROVIDER_SITE_OTHER): Payer: BC Managed Care – PPO | Admitting: Urology

## 2015-07-26 VITALS — BP 125/84 | HR 105 | Ht 63.0 in | Wt 157.6 lb

## 2015-07-26 DIAGNOSIS — R399 Unspecified symptoms and signs involving the genitourinary system: Secondary | ICD-10-CM | POA: Diagnosis not present

## 2015-07-26 DIAGNOSIS — N390 Urinary tract infection, site not specified: Secondary | ICD-10-CM | POA: Diagnosis not present

## 2015-07-26 DIAGNOSIS — R310 Gross hematuria: Secondary | ICD-10-CM | POA: Diagnosis not present

## 2015-07-26 DIAGNOSIS — N200 Calculus of kidney: Secondary | ICD-10-CM

## 2015-07-26 MED ORDER — URIBEL 118 MG PO CAPS: 1.0000 | ORAL_CAPSULE | Freq: Four times a day (QID) | ORAL | Status: DC

## 2015-07-26 NOTE — Progress Notes (Signed)
4:17 PM   BURNELL HARTL [birth year] [id number]  Referring provider: Lavera Guise, MD [address] [place], [place] [address]  Chief Complaint  Patient presents with  . Follow-up    gross hematuria     HPI: Patient is a 40 year old Caucasian female who presents today to discuss the results of her CT urogram that was conducted due to gross hematuria and right-sided flank pain.   Background story Patient with a history of nephrolithiasis and recurrent urinary tract infections who presented to [place] with symptoms reminiscent of her previous stone.  Patient states on 06/11/2015 she was having urinary frequency, dysuria, nocturia 2 and gross hematuria. She was diagnosed with a UTI and given an antibiotic. She states she found no relief with the antibiotics.  She was having episodes of right-sided flank pain.  Urine culture from 07/06/2015 was positive for Citrobacter koseri and was sensitive to the Cipro.    Today, she is still experiencing right-sided flank pain.  She is also having urinary frequency, suprapubic aching and urgency. Her UA today was unremarkable.   CT urogram was completed on 07/16/2015. It demonstrated a 3 mm nonobstructive calculus in the interpolar collecting system on the right kidney. I have personally reviewed the films with the patient.   PMH: Past Medical History  Diagnosis Date  . Yeast infection   . H/O varicella   . H/O mumps   . H/O bladder infections   . BV (bacterial vaginosis) 08/29/10  . Anxiety 08/29/10  . Post - coital bleeding 08/29/10  . Menometrorrhagia 06/26/10  . PMS (premenstrual syndrome) 07/10/11  . H/O toxoplasmosis   . Complication of anesthesia 2004    vomited  . Infections of kidney     several kidney stones, bladder infections  . Renal calculus, left   . Gross hematuria 07/08/2015  . Kidney disease 09/03/2011    Left ureteral calculus with partial obstruction, passed 06/09/2014. CT did show perinephric stranding.   Marland Kitchen UTI (lower  urinary tract infection) 07/08/2015    Surgical History: Past Surgical History  Procedure Laterality Date  . Tonsillectomy    . Breast reduction surgery  6/04  . Bladder suspension  03/16/2012    Procedure: TRANSVAGINAL TAPE (TVT) PROCEDURE;  Surgeon: [name], MD;  Location: [facility];  Service: Gynecology;  Laterality: N/A;  . Anterior and posterior repair  03/16/2012    Procedure: ANTERIOR (CYSTOCELE) AND POSTERIOR REPAIR (RECTOCELE);  Surgeon: [name], MD;  Location: [facility];  Service: Gynecology;  Laterality: N/A;  anterior repair/cysto  . Dilitation & currettage/hystroscopy with novasure ablation  03/16/2012    Procedure: DILATATION & CURETTAGE/HYSTEROSCOPY WITH NOVASURE ABLATION;  Surgeon: [name], MD;  Location: [facility];  Service: Gynecology;  Laterality: N/A;  . Cystoscopy  03/16/2012    Procedure: CYSTOSCOPY;  Surgeon: [name], MD;  Location: [facility];  Service: Gynecology;  Laterality: N/A;  . Cholecystectomy  06/28/14    Home Medications:    Medication List       This list is accurate as of: 07/26/15  4:17 PM.  Always use your most recent med list.               cetirizine 10 MG tablet  Commonly known as:  ZYRTEC  Take 10 mg by mouth daily.     diclofenac 75 MG EC tablet  Commonly known as:  VOLTAREN  Take 1 tablet (75 mg total) by mouth 2 (two) times daily.  DULoxetine 60 MG capsule  Commonly known as:  CYMBALTA     furosemide 20 MG tablet  Commonly known as:  LASIX     Hydrocodone-Acetaminophen 5-300 MG Tabs  Take 1 tablet by mouth every 6 (six) hours as needed.     ibuprofen 600 MG tablet  Commonly known as:  ADVIL,MOTRIN  1 po pc q 6 hours x 3 days then prn for pain     URIBEL 118 MG Caps  Take 1 capsule (118 mg total) by mouth 4 (four) times daily.     Vitamin D (Ergocalciferol) 50000 units Caps capsule  Commonly known as:  DRISDOL     zolpidem 10 MG tablet  Commonly known as:  AMBIEN        Allergies:    Allergies  Allergen Reactions  . Sulfa Antibiotics Itching  . Sulfonamide Derivatives     REACTION: Eyes Itch    Family History: Family History  Problem Relation Age of Onset  . Cancer Mother     cervical  . Depression Mother   . Drug abuse Mother   . Anemia Mother     blood transfusion  . Migraines Mother   . Cancer Other     breast    Social History:  reports that she has never smoked. She has never used smokeless tobacco. She reports that she does not drink alcohol or use illicit drugs.  ROS: UROLOGY Frequent Urination?: Yes Hard to postpone urination?: No Burning/pain with urination?: No Get up at night to urinate?: Yes Leakage of urine?: No Urine stream starts and stops?: No Trouble starting stream?: No Do you have to strain to urinate?: No Blood in urine?: No Urinary tract infection?: No Sexually transmitted disease?: No Injury to kidneys or bladder?: No Painful intercourse?: No Weak stream?: No Currently pregnant?: No Vaginal bleeding?: No Last menstrual period?: 07/23/15  Gastrointestinal Nausea?: No Vomiting?: No Indigestion/heartburn?: No Diarrhea?: No Constipation?: No  Constitutional Fever: No Night sweats?: Yes Weight loss?: No Fatigue?: Yes  Skin Skin rash/lesions?: No Itching?: No  Eyes Blurred vision?: No Double vision?: No  Ears/Nose/Throat Sore throat?: Yes Sinus problems?: Yes  Hematologic/Lymphatic Swollen glands?: No Easy bruising?: No  Cardiovascular Leg swelling?: No Chest pain?: No  Respiratory Cough?: No Shortness of breath?: No  Endocrine Excessive thirst?: No  Musculoskeletal Back pain?: Yes Joint pain?: No  Neurological Headaches?: Yes Dizziness?: No  Psychologic Depression?: Yes Anxiety?: No  Physical Exam: BP 125/84 mmHg  Pulse 105  Ht 5\' 3"  (1.6 m)  Wt 157 lb 9.6 oz (71.487 kg)  BMI 27.92 kg/m2  LMP 06/25/2015  Constitutional: Well nourished. Alert and oriented, No acute  distress. HEENT: [place] AT, moist mucus membranes. Trachea midline, no masses. Cardiovascular: No clubbing, cyanosis, or edema. Respiratory: Normal respiratory effort, no increased work of breathing. GI: Abdomen is soft, non tender, non distended, no abdominal masses.  Skin: No rashes, bruises or suspicious lesions. Lymph: No cervical or inguinal adenopathy. Neurologic: Grossly intact, no focal deficits, moving all 4 extremities. Psychiatric: Normal mood and affect.  Laboratory Data: Lab Results  Component Value Date   WBC 11.2* 07/10/2014   HGB 12.5 07/10/2014   HCT 37.6 07/10/2014   MCV 95 07/10/2014   PLT 367 07/10/2014    Lab Results  Component Value Date   CREATININE 0.65 07/06/2015    Lab Results  Component Value Date   AST 16 07/10/2014   Lab Results  Component Value Date   ALT 24 07/10/2014  Urinalysis Results for orders placed or performed in visit on 07/26/15  Microscopic Examination  Result Value Ref Range   WBC, UA 0-5 0 -  5 /hpf   RBC, UA 0-2 0 -  2 /hpf   Epithelial Cells (non renal) 0-10 0 - 10 /hpf   Bacteria, UA Few (A) None seen/Few  Urinalysis, Complete  Result Value Ref Range   Specific Gravity, UA 1.015 1.005 - 1.030   pH, UA 6.0 5.0 - 7.5   Color, UA Yellow Yellow   Appearance Ur Clear Clear   Leukocytes, UA Trace (A) Negative   Protein, UA Negative Negative/Trace   Glucose, UA Negative Negative   Ketones, UA Negative Negative   RBC, UA Trace (A) Negative   Bilirubin, UA Negative Negative   Urobilinogen, Ur 0.2 0.2 - 1.0 mg/dL   Nitrite, UA Negative Negative   Microscopic Examination See below:     Assessment & Plan:    1. Urinary tract infection:   Patient has completed her Cipro. Her UA is unremarkable today.  We will send the urine for culture to ensure complete eradication of the bacteria.  - CULTURE, URINE COMPREHENSIVE - Urinalysis, Complete  2. Gross hematuria:   Patient has completed a CT urogram.  She has a 3 mm  nonobstructing calculus of the interpolar collecting system of the right kidney.  She'll be scheduled for cystoscopy to complete the hematuria workup.  I have explained to the patient that they will  be scheduled for a cystoscopy in our office to evaluate their bladder.  The cystoscopy consists of passing a tube with a lens up through their urethra and into their urinary bladder.   We will inject the urethra with a lidocaine gel prior to introducing the cystoscope to help with any discomfort during the procedure.   After the procedure, they might experience blood in the urine and discomfort with urination.  This will abate after the first few voids.  I have  encouraged the patient to increase water intake  during this time.  Patient denies any allergies to lidocaine.   3. Right renal stone:   She is having episodes of flank pain.  I expressed my doubts to the patient that the 3 mm nonobstructing calculus found in the right kidney was responsible for the current symptoms of right-sided flank pain and lower urinary tract symptoms. We will continue to monitor this stone with yearly KUBs.  4. Irritative bladder symptoms:   Patient experiencing dysuria, suprapubic pain and urgency. She will be undergoing a cystoscopic exam in the future to complete her hematuria workup. Her urine is also being sent for culture to ensure the eradication of the Citrobacter.  Return for cystoscopy for hematuria.  These notes generated with voice recognition software. I apologize for typographical errors.  Zara Council, [facility] [address] [place], [place] [address] [phone]

## 2015-07-27 ENCOUNTER — Other Ambulatory Visit: Payer: Self-pay

## 2015-07-27 ENCOUNTER — Other Ambulatory Visit: Payer: BC Managed Care – PPO

## 2015-07-27 DIAGNOSIS — R399 Unspecified symptoms and signs involving the genitourinary system: Secondary | ICD-10-CM

## 2015-07-27 LAB — URINALYSIS, COMPLETE
Bilirubin, UA: NEGATIVE
Glucose, UA: NEGATIVE
KETONES UA: NEGATIVE
Nitrite, UA: NEGATIVE
Protein, UA: NEGATIVE
SPEC GRAV UA: 1.015 (ref 1.005–1.030)
Urobilinogen, Ur: 0.2 mg/dL (ref 0.2–1.0)
pH, UA: 6 (ref 5.0–7.5)

## 2015-07-27 LAB — MICROSCOPIC EXAMINATION

## 2015-07-29 LAB — CULTURE, URINE COMPREHENSIVE

## 2015-08-14 ENCOUNTER — Encounter: Payer: Self-pay | Admitting: Urology

## 2015-08-14 ENCOUNTER — Ambulatory Visit (INDEPENDENT_AMBULATORY_CARE_PROVIDER_SITE_OTHER): Payer: BC Managed Care – PPO | Admitting: Urology

## 2015-08-14 VITALS — BP 129/83 | HR 91 | Ht 63.0 in | Wt 157.8 lb

## 2015-08-14 DIAGNOSIS — N8111 Cystocele, midline: Secondary | ICD-10-CM | POA: Diagnosis not present

## 2015-08-14 DIAGNOSIS — N200 Calculus of kidney: Secondary | ICD-10-CM

## 2015-08-14 DIAGNOSIS — R310 Gross hematuria: Secondary | ICD-10-CM | POA: Diagnosis not present

## 2015-08-14 DIAGNOSIS — N393 Stress incontinence (female) (male): Secondary | ICD-10-CM | POA: Insufficient documentation

## 2015-08-14 LAB — URINALYSIS, COMPLETE
BILIRUBIN UA: NEGATIVE
GLUCOSE, UA: NEGATIVE
KETONES UA: NEGATIVE
Nitrite, UA: NEGATIVE
PROTEIN UA: NEGATIVE
RBC, UA: NEGATIVE
Urobilinogen, Ur: 0.2 mg/dL (ref 0.2–1.0)
pH, UA: 5.5 (ref 5.0–7.5)

## 2015-08-14 LAB — MICROSCOPIC EXAMINATION: RBC, UA: NONE SEEN /hpf (ref 0–?)

## 2015-08-14 MED ORDER — LIDOCAINE HCL 2 % EX GEL
1.0000 "application " | Freq: Once | CUTANEOUS | Status: AC
  Administered 2015-08-14: 1 via URETHRAL

## 2015-08-14 MED ORDER — CIPROFLOXACIN HCL 500 MG PO TABS
500.0000 mg | ORAL_TABLET | Freq: Once | ORAL | Status: AC
  Administered 2015-08-14: 500 mg via ORAL

## 2015-08-14 NOTE — Progress Notes (Signed)
08/14/2015 8:39 AM   Kayla Jimenez 12-02-1975 [id number]  Referring provider: Lavera Guise, MD [address] [place], [place] [address]  Chief Complaint  Patient presents with  . Cysto    gross hematuria    HPI:  1 - Gross Hematuria - visible blood 06/2015 prompting eval. Non-smoker. CT with Rt non-obstructing small renal stone, cysto unremarkable 08/2015.   2 - Cystocele - s/p cystocele repair by GYN pre 2010. No recurrence by pelvic 2017. Uterus and cervix remain in situ.   3 - Stress Urinary Incontinence - s/p mid uretrhal mesh sling by GYN pre 2010 at time of cystocele repair. No mesh erosion by cysto 2017.  4 - Nephrolithiasis - 83mm incidental Rt renal stone by hematuria CT 2017, non-obstructing.  Today "Kayla Jimenez" is seen for cysto to complete hematuria eval.    PMH: Past Medical History  Diagnosis Date  . Yeast infection   . H/O varicella   . H/O mumps   . H/O bladder infections   . BV (bacterial vaginosis) 08/29/10  . Anxiety 08/29/10  . Post - coital bleeding 08/29/10  . Menometrorrhagia 06/26/10  . PMS (premenstrual syndrome) 07/10/11  . H/O toxoplasmosis   . Complication of anesthesia 2004    vomited  . Infections of kidney     several kidney stones, bladder infections  . Renal calculus, left   . Gross hematuria 07/08/2015  . Kidney disease 09/03/2011    Left ureteral calculus with partial obstruction, passed 06/09/2014. CT did show perinephric stranding.   Marland Kitchen UTI (lower urinary tract infection) 07/08/2015    Surgical History: Past Surgical History  Procedure Laterality Date  . Tonsillectomy    . Breast reduction surgery  6/04  . Bladder suspension  03/16/2012    Procedure: TRANSVAGINAL TAPE (TVT) PROCEDURE;  Surgeon: [name], MD;  Location: [facility];  Service: Gynecology;  Laterality: N/A;  . Anterior and posterior repair  03/16/2012    Procedure: ANTERIOR (CYSTOCELE) AND POSTERIOR REPAIR (RECTOCELE);  Surgeon: [name], MD;  Location: [facility];   Service: Gynecology;  Laterality: N/A;  anterior repair/cysto  . Dilitation & currettage/hystroscopy with novasure ablation  03/16/2012    Procedure: DILATATION & CURETTAGE/HYSTEROSCOPY WITH NOVASURE ABLATION;  Surgeon: [name], MD;  Location: [facility];  Service: Gynecology;  Laterality: N/A;  . Cystoscopy  03/16/2012    Procedure: CYSTOSCOPY;  Surgeon: [name], MD;  Location: [facility];  Service: Gynecology;  Laterality: N/A;  . Cholecystectomy  06/28/14    Home Medications:    Medication List       This list is accurate as of: 08/14/15  8:39 AM.  Always use your most recent med list.               cetirizine 10 MG tablet  Commonly known as:  ZYRTEC  Take 10 mg by mouth daily.     diclofenac 75 MG EC tablet  Commonly known as:  VOLTAREN  Take 1 tablet (75 mg total) by mouth 2 (two) times daily.     DULoxetine 60 MG capsule  Commonly known as:  CYMBALTA     ferrous sulfate 325 (65 FE) MG tablet  TAKE 1 TABLET DAILY FOR IRON DEFICIENCY.     furosemide 20 MG tablet  Commonly known as:  LASIX     Hydrocodone-Acetaminophen 5-300 MG Tabs  Take 1 tablet by mouth every 6 (six) hours as needed.     ibuprofen 600 MG tablet  Commonly  known as:  ADVIL,MOTRIN  1 po pc q 6 hours x 3 days then prn for pain     neomycin-polymyxin-hydrocortisone 3.5-10000-1 otic suspension  Commonly known as:  CORTISPORIN  PLACE 4 TO 5 DROPS INTO BOTH EARS THREE TIMES DAILY     URIBEL 118 MG Caps  Take 1 capsule (118 mg total) by mouth 4 (four) times daily.     Vitamin D (Ergocalciferol) 50000 units Caps capsule  Commonly known as:  DRISDOL     zolpidem 10 MG tablet  Commonly known as:  AMBIEN        Allergies:  Allergies  Allergen Reactions  . Sulfa Antibiotics Itching  . Sulfonamide Derivatives     REACTION: Eyes Itch    Family History: Family History  Problem Relation Age of Onset  . Cancer Mother     cervical  . Depression Mother   . Drug abuse Mother   . Anemia  Mother     blood transfusion  . Migraines Mother   . Cancer Other     breast    Social History:  reports that she has never smoked. She has never used smokeless tobacco. She reports that she does not drink alcohol or use illicit drugs.     Review of Systems  Gastrointestinal (upper)  : Negative for upper GI symptoms  Gastrointestinal (lower) : Negative for lower GI symptoms  Constitutional : Negative for symptoms  Skin: Negative for skin symptoms  Eyes: Negative for eye symptoms  Ear/Nose/Throat : Negative for Ear/Nose/Throat symptoms  Hematologic/Lymphatic: Negative for Hematologic/Lymphatic symptoms  Cardiovascular : Negative for cardiovascular symptoms  Respiratory : Negative for respiratory symptoms  Endocrine: Negative for endocrine symptoms  Musculoskeletal: Negative for musculoskeletal symptoms  Neurological: Negative for neurological symptoms  Psychologic: Negative for psychiatric symptoms      Physical Exam: LMP 06/25/2015  Constitutional:  Alert and oriented, No acute distress. HEENT: [place] AT, moist mucus membranes.  Trachea midline, no masses. Cardiovascular: No clubbing, cyanosis, or edema. Respiratory: Normal respiratory effort, no increased work of breathing. GI: Abdomen is soft, nontender, nondistended, no abdominal masses GU: No CVA tenderness. No cystocele or vaginal mesh erosion.  Skin: No rashes, bruises or suspicious lesions. Lymph: No cervical or inguinal adenopathy. Neurologic: Grossly intact, no focal deficits, moving all 4 extremities. Psychiatric: Normal mood and affect.  Laboratory Data: Lab Results  Component Value Date   WBC 11.2* 07/10/2014   HGB 12.5 07/10/2014   HCT 37.6 07/10/2014   MCV 95 07/10/2014   PLT 367 07/10/2014    Lab Results  Component Value Date   CREATININE 0.65 07/06/2015    No results found for: PSA  No results found for: TESTOSTERONE  No results found for: HGBA1C  Urinalysis      Component Value Date/Time   APPEARANCEUR Clear 07/26/2015 1548   PHURINE 5.0 08/11/2011 1654   GLUCOSEU Negative 07/26/2015 1548   BILIRUBINUR Negative 07/26/2015 1548   BILIRUBINUR negative 04/27/2012 1624   PROTEINUR Negative 07/26/2015 1548   PROTEINUR negative 04/27/2012 1624   UROBILINOGEN negative 04/27/2012 1624   NITRITE Negative 07/26/2015 1548   NITRITE negative 04/27/2012 1624   LEUKOCYTESUR Trace* 07/26/2015 1548   LEUKOCYTESUR moderate (2+) 04/27/2012 1624      Cystoscopy Procedure Note  Patient identification was confirmed, informed consent was obtained, and patient was prepped using Betadine solution.  Lidocaine jelly was administered per urethral meatus.    Preoperative abx where received prior to procedure.    Procedure: - Flexible cystoscope  introduced, without any difficulty.   - Thorough search of the bladder revealed:    normal urethral meatus    normal urothelium    no stones    no ulcers     no tumors    no urethral polyps    no trabeculation  Area of midurethra carefully inspected and no calcificaitons or visible mesh erosion.   - Ureteral orifices were normal in position and appearance.  Post-Procedure: - Patient tolerated the procedure well    Assessment & Plan:    1 - Gross Hematuria - likely from small non-obsructing stone as per below. This is good news.   2 - Cystocele -non recurrent, observe.  3 - Stress Urinary Incontinence - non recurrent, observe.  4 - Nephrolithiasis - very small incidental stone, likely source of rare hematuria. Discussed colic symptoms should they occurs and high liklihood of medical passage w/o surgery at present size. She is not planning further elective pregnancy or extended foreign travel. She declines metabolic eval.  5 - RTC 1 year with KUB.      No Follow-up on file.  Alexis Frock, [facility] [address] [place], [place] [address] [phone]

## 2015-08-24 ENCOUNTER — Other Ambulatory Visit: Payer: BC Managed Care – PPO

## 2015-11-14 ENCOUNTER — Ambulatory Visit: Payer: BC Managed Care – PPO | Admitting: Podiatry

## 2015-12-19 ENCOUNTER — Encounter: Payer: Self-pay | Admitting: Podiatry

## 2015-12-19 ENCOUNTER — Ambulatory Visit (INDEPENDENT_AMBULATORY_CARE_PROVIDER_SITE_OTHER): Payer: BC Managed Care – PPO | Admitting: Podiatry

## 2015-12-19 ENCOUNTER — Ambulatory Visit (INDEPENDENT_AMBULATORY_CARE_PROVIDER_SITE_OTHER): Payer: BC Managed Care – PPO

## 2015-12-19 DIAGNOSIS — M778 Other enthesopathies, not elsewhere classified: Secondary | ICD-10-CM

## 2015-12-19 DIAGNOSIS — M722 Plantar fascial fibromatosis: Secondary | ICD-10-CM

## 2015-12-19 DIAGNOSIS — M779 Enthesopathy, unspecified: Secondary | ICD-10-CM

## 2015-12-19 DIAGNOSIS — M7751 Other enthesopathy of right foot: Secondary | ICD-10-CM

## 2015-12-19 MED ORDER — METHYLPREDNISOLONE 4 MG PO TBPK: ORAL_TABLET | ORAL | 0 refills | Status: DC

## 2015-12-19 MED ORDER — DICLOFENAC SODIUM 75 MG PO TBEC: 75.0000 mg | DELAYED_RELEASE_TABLET | Freq: Two times a day (BID) | ORAL | 1 refills | Status: DC

## 2015-12-19 NOTE — Progress Notes (Signed)
She presents today with a chief complaint of pain times the past several months to the plantar right heel and the second metatarsophalangeal joint. She states that my foot has been tolerable since I was here last in 2015 however recently it has just gotten worse. She's recently purchased a new pair of [employer] but the foot still hurts. She has the black brace at home as well as a night splint.  Objective: Vital signs are stable she's alert and oriented 3 have reviewed her past medical history medications allergies and surgeries. Pulses are palpable. Neurologic sensory is intact. She has pain on palpation medially continue tubercle of the right heel and range of motion of the second third metatarsophalangeal joints. Radiographs do indicate a soft tissue increase in density at the plantar fascia insertion site indicative of plantar fasciitis and capsulitis of the second metatarsophalangeal joint right.  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  Plan: I injected the second interdigital space today right foot with Kenalog and local anesthetic and injected the right heel as well. I also started her on a Medrol Dosepak. Once completed with the Medrol Dosepak she was start with diclofenac. She will utilize her braces that she has at home. I will follow up with her in 1 month.

## 2015-12-19 NOTE — Addendum Note (Signed)
Addended by: Clovis Riley E on: 12/19/2015 01:11 PM   Modules accepted: Orders

## 2016-01-23 ENCOUNTER — Ambulatory Visit: Payer: BC Managed Care – PPO | Admitting: Podiatry

## 2016-07-01 ENCOUNTER — Other Ambulatory Visit: Payer: Self-pay | Admitting: Nurse Practitioner

## 2016-07-01 DIAGNOSIS — N200 Calculus of kidney: Secondary | ICD-10-CM

## 2016-07-04 ENCOUNTER — Ambulatory Visit
Admission: RE | Admit: 2016-07-04 | Discharge: 2016-07-04 | Disposition: A | Discharge status: Home / Self Care | Payer: BC Managed Care – PPO | Source: Ambulatory Visit | Attending: Nurse Practitioner | Admitting: Nurse Practitioner

## 2016-07-04 DIAGNOSIS — N200 Calculus of kidney: Secondary | ICD-10-CM | POA: Diagnosis not present

## 2016-07-04 DIAGNOSIS — Z9049 Acquired absence of other specified parts of digestive tract: Secondary | ICD-10-CM | POA: Insufficient documentation

## 2016-08-13 NOTE — Progress Notes (Deleted)
9:33 AM   Kayla Jimenez 1976-02-07 [id number]  Referring provider: Lavera Guise, [place], [place] [address]  No chief complaint on file.   HPI: Patient is a 41 year old Caucasian female who presents today for a one year follow up for nephrolithiasis and a history of hematuria.  Nephrolithiasis 80mm incidental Rt renal stone by hematuria CT 2017, non-obstructing.  She does not report any flank pain or gross hematuria.  KUB taken today demonstrates ***  History of hematuria Work up completed in 2017 with CTU and cystoscopy.  No worrisome findings.  No report of gross hematuria.  No hematuria seen on today's UA.  ***  PMH: Past Medical History:  Diagnosis Date  . Anxiety 08/29/10  . BV (bacterial vaginosis) 08/29/10  . Complication of anesthesia 2004   vomited  . Gross hematuria 07/08/2015  . H/O bladder infections   . H/O mumps   . H/O toxoplasmosis   . H/O varicella   . Infections of kidney    several kidney stones, bladder infections  . Kidney disease 09/03/2011   Left ureteral calculus with partial obstruction, passed 06/09/2014. CT did show perinephric stranding.   . Menometrorrhagia 06/26/10  . PMS (premenstrual syndrome) 07/10/11  . Post - coital bleeding 08/29/10  . Renal calculus, left   . UTI (lower urinary tract infection) 07/08/2015  . Yeast infection     Surgical History: Past Surgical History:  Procedure Laterality Date  . ANTERIOR AND POSTERIOR REPAIR  03/16/2012   Procedure: ANTERIOR (CYSTOCELE) AND POSTERIOR REPAIR (RECTOCELE);  Surgeon: [name], MD;  Location: [facility];  Service: Gynecology;  Laterality: N/A;  anterior repair/cysto  . BLADDER SUSPENSION  03/16/2012   Procedure: TRANSVAGINAL TAPE (TVT) PROCEDURE;  Surgeon: [name], MD;  Location: [facility];  Service: Gynecology;  Laterality: N/A;  . BREAST REDUCTION SURGERY  6/04  . CHOLECYSTECTOMY  06/28/14  . CYSTOSCOPY  03/16/2012   Procedure: CYSTOSCOPY;  Surgeon: [name], MD;  Location: [facility];  Service: Gynecology;  Laterality: N/A;  . DILITATION & CURRETTAGE/HYSTROSCOPY WITH NOVASURE ABLATION  03/16/2012   Procedure: DILATATION & CURETTAGE/HYSTEROSCOPY WITH NOVASURE ABLATION;  Surgeon: [name], MD;  Location: [facility];  Service: Gynecology;  Laterality: N/A;  . TONSILLECTOMY      Home Medications:  Allergies as of 08/14/2016      Reactions   Sulfa Antibiotics Itching   Sulfonamide Derivatives    REACTION: Eyes Itch      Medication List       Accurate as of 08/13/16  9:33 AM. Always use your most recent med list.          buPROPion 75 MG tablet Commonly known as:  WELLBUTRIN   cetirizine 10 MG tablet Commonly known as:  ZYRTEC Take 10 mg by mouth daily. Reported on 08/14/2015   diclofenac 75 MG EC tablet Commonly known as:  VOLTAREN Take 1 tablet (75 mg total) by mouth 2 (two) times daily.   DULoxetine 60 MG capsule Commonly known as:  CYMBALTA   ferrous sulfate 325 (65 FE) MG tablet TAKE 1 TABLET DAILY FOR IRON DEFICIENCY.   furosemide 20 MG tablet Commonly known as:  LASIX   methylPREDNISolone 4 MG Tbpk tablet Commonly known as:  MEDROL DOSEPAK 6 day dose pack - take as directed   montelukast 10 MG tablet Commonly known as:  SINGULAIR TAKE 1 TABLET BY MOUTH EVERY DAY   Vitamin D (Ergocalciferol) 50000 units Caps  capsule Commonly known as:  DRISDOL   zolpidem 10 MG tablet Commonly known as:  AMBIEN       Allergies:  Allergies  Allergen Reactions  . Sulfa Antibiotics Itching  . Sulfonamide Derivatives     REACTION: Eyes Itch    Family History: Family History  Problem Relation Age of Onset  . Cancer Mother     cervical  . Depression Mother   . Drug abuse Mother   . Anemia Mother     blood transfusion  . Migraines Mother   . Cancer Other     breast    Social History:  reports that she has never smoked. She has never used smokeless tobacco. She reports that she does not drink alcohol or use  drugs.  ROS:                                        Physical Exam: There were no vitals taken for this visit.  Constitutional: Well nourished. Alert and oriented, No acute distress. HEENT: [place] AT, moist mucus membranes. Trachea midline, no masses. Cardiovascular: No clubbing, cyanosis, or edema. Respiratory: Normal respiratory effort, no increased work of breathing. GI: Abdomen is soft, non tender, non distended, no abdominal masses.  Skin: No rashes, bruises or suspicious lesions. Lymph: No cervical or inguinal adenopathy. Neurologic: Grossly intact, no focal deficits, moving all 4 extremities. Psychiatric: Normal mood and affect.  Laboratory Data: Lab Results  Component Value Date   WBC 11.2 (H) 07/10/2014   HGB 12.5 07/10/2014   HCT 37.6 07/10/2014   MCV 95 07/10/2014   PLT 367 07/10/2014    Lab Results  Component Value Date   CREATININE 0.65 07/06/2015    Lab Results  Component Value Date   AST 16 07/10/2014   Lab Results  Component Value Date   ALT 24 07/10/2014    Urinalysis ***  Assessment & Plan:    1. History of hematuria  - hematuria work up completed in 2017 - no worrisome findings  - no reports of gross hematuria  - UA today ***  - RTC in one year for UA  - report any episodes of gross hematuria  2. Right renal stone  - KUB ***  - RTC in one year for KUB  No Follow-up on file.  These notes generated with voice recognition software. I apologize for typographical errors.  Zara Council, [facility] [address], [place], [place] [address] [phone]

## 2016-08-14 ENCOUNTER — Encounter: Payer: Self-pay | Admitting: Urology

## 2016-08-14 ENCOUNTER — Ambulatory Visit: Payer: BC Managed Care – PPO | Admitting: Urology

## 2016-09-09 IMAGING — US US RENAL KIDNEY
1 series · 14 of 25 positions shown · non-contrast
Comparison: CT scan of the abdomen and pelvis of May 23, 2014

CLINICAL DATA: Reassess status of hydronephrosis; patient reports
dysuria

EXAM:
RENAL/URINARY TRACT ULTRASOUND COMPLETE

[Series 1: us renal kidney · 0.26mm/px · 14 of 34 slices shown]
[im 1/34]
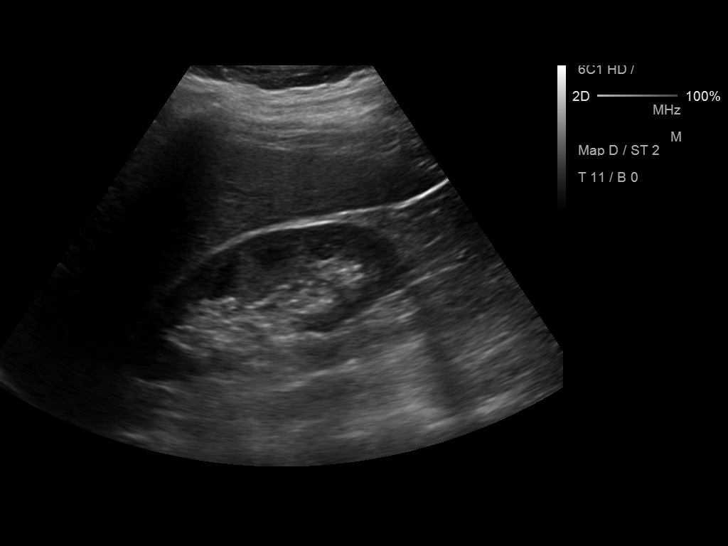
[im 3/34]
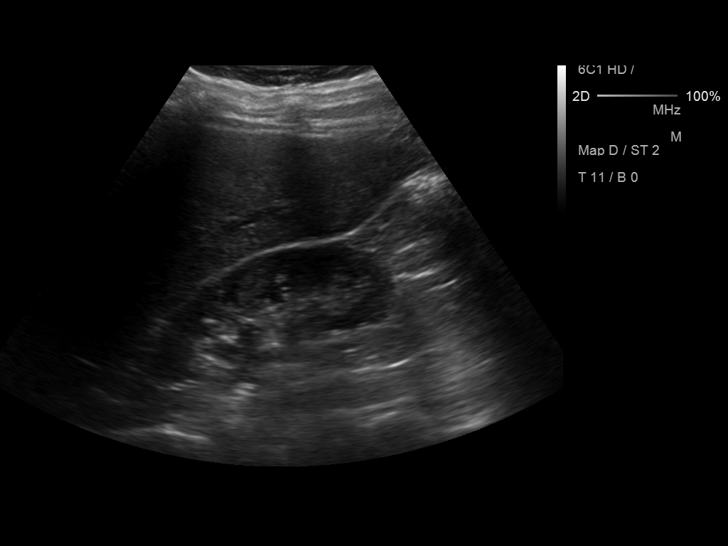
[im 6/34]
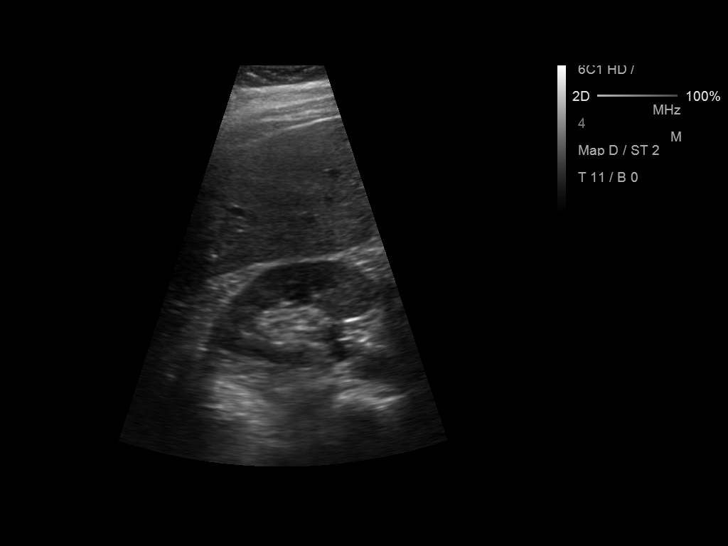
[im 9/34]
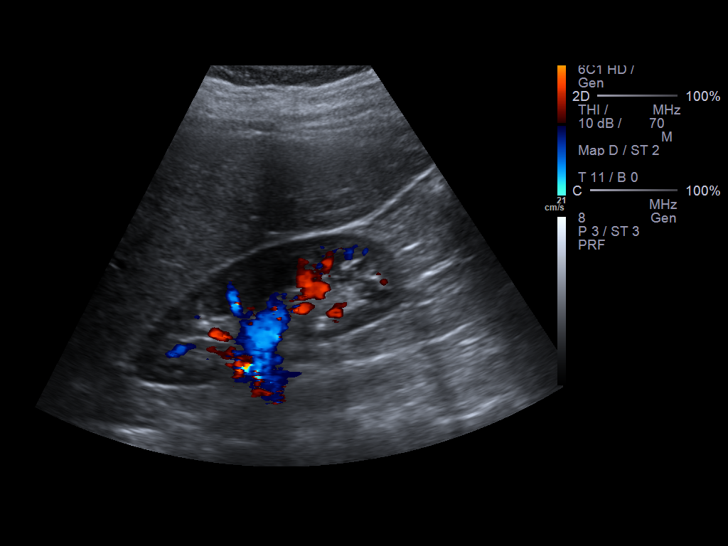
[im 12/34]
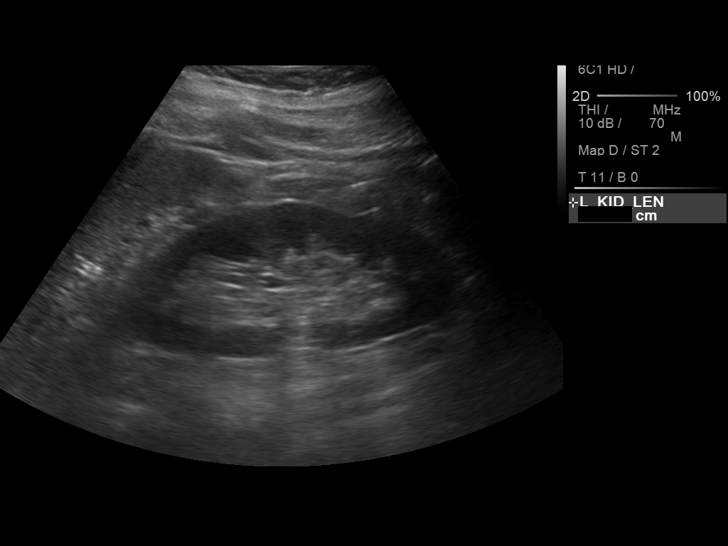
[im 13/34]
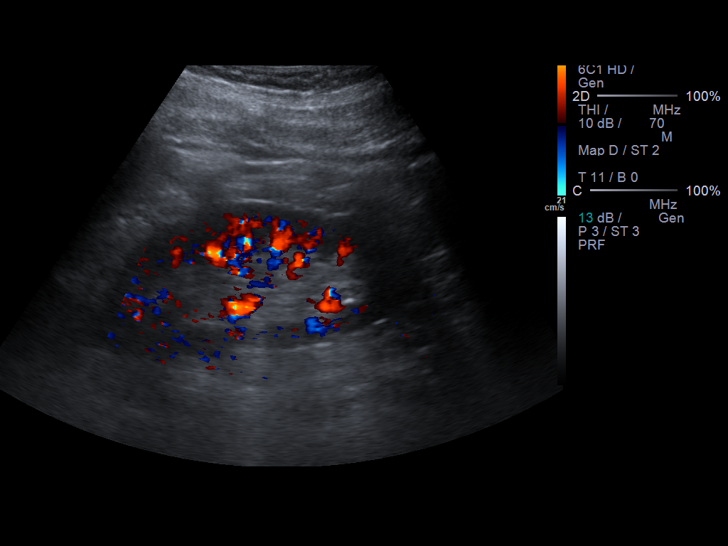
[im 16/34]
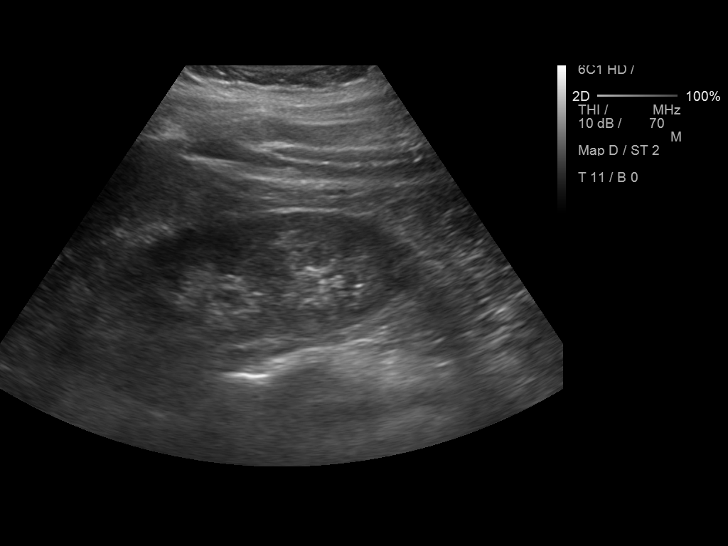
[im 18/34]
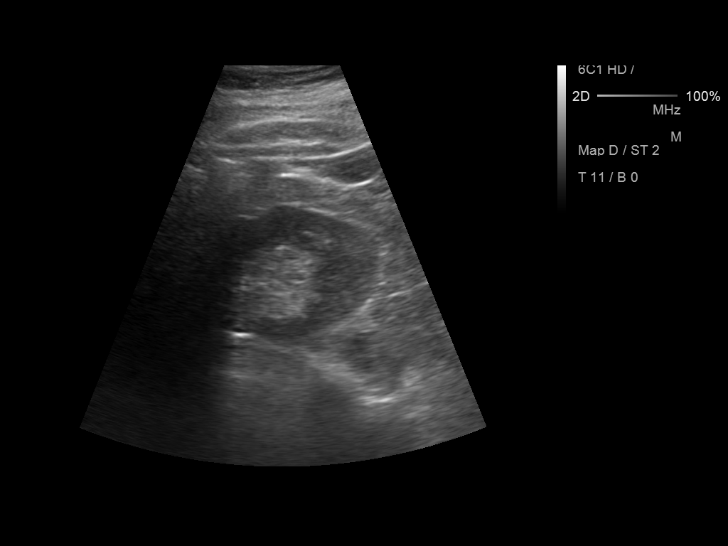
[im 21/34]
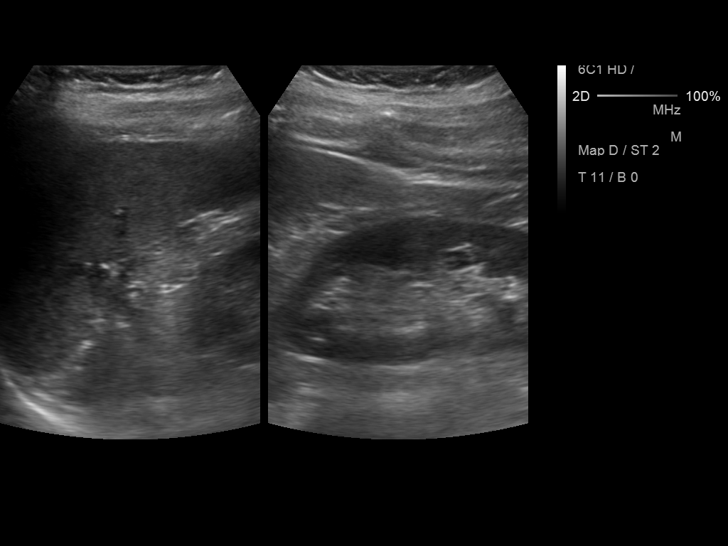
[im 23/34]
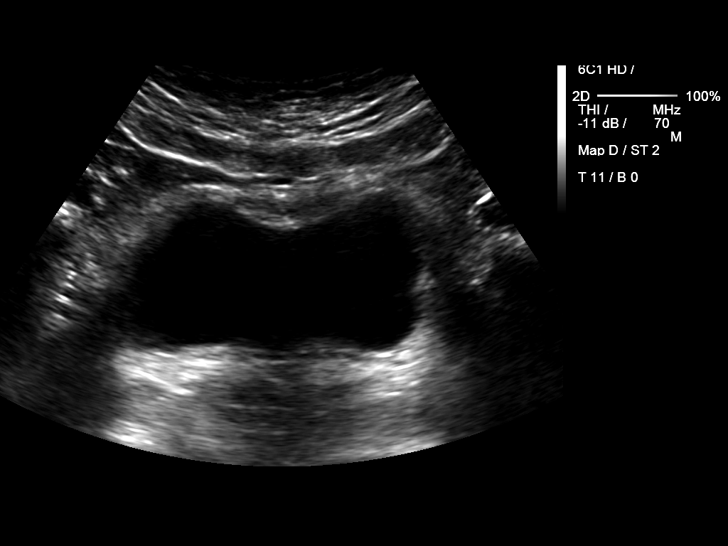
[im 25/34]
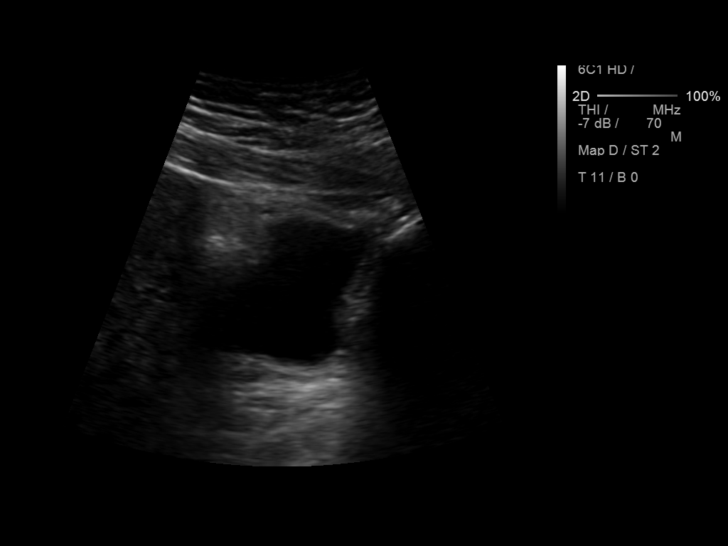
[im 28/34]
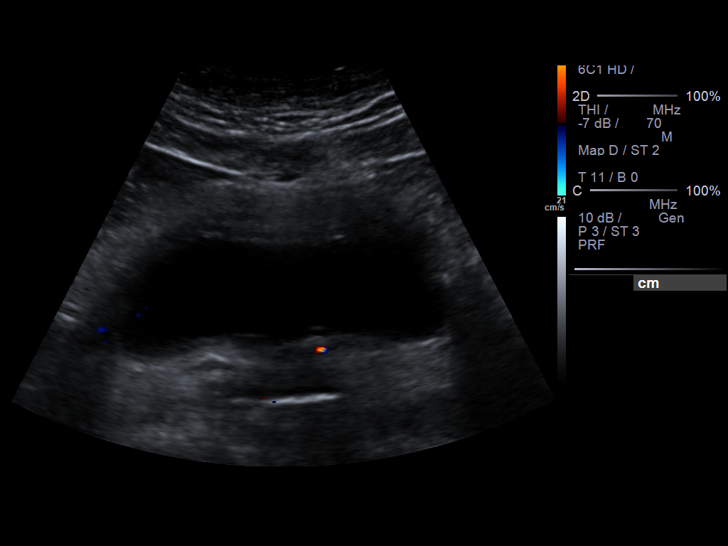
[im 31/34]
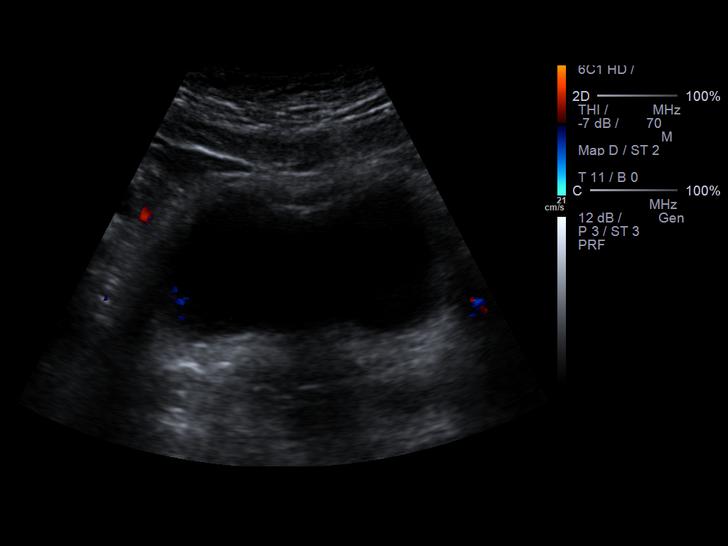
[im 34/34]
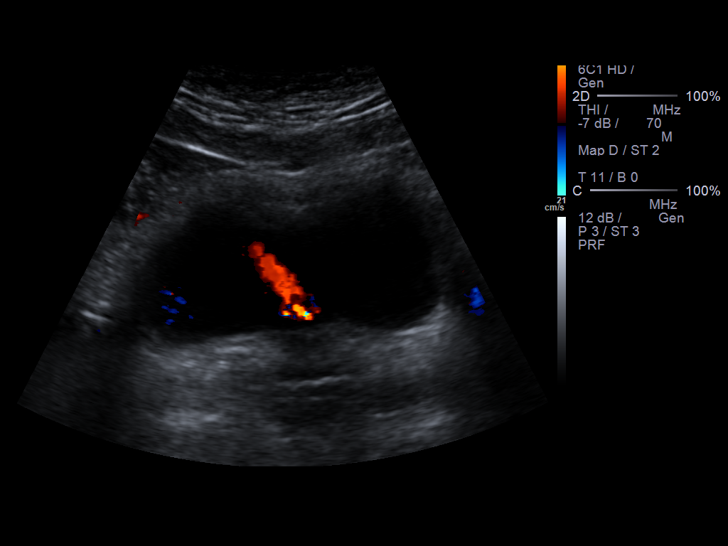

[14 of 25 positions shown; findings below may reference images not displayed]

FINDINGS: Right Kidney:

Length: 10.9 cm. Echogenicity within normal limits. No mass or
hydronephrosis visualized.

Left Kidney:

Length: 11.5 cm. Echogenicity within normal limits. No mass or
hydronephrosis visualized.

Bladder:

The urinary bladder is adequately distended. There is an echogenic
focus noted at the level of the ureterovesical junction on the left.
This may reflect a nonobstructing stone. Bilateral ureteral jets
were demonstrated.
IMPRESSION: 1. There is no residual hydronephrosis. The kidneys are normal in
appearance.
2. Bilateral ureteral jets are demonstrated. There is a tiny
echogenic focus at the level of the left ureterovesical junction
which may reflect the previously described 3 mm calcified stone. It
is not producing obstruction currently.

## 2016-10-06 ENCOUNTER — Ambulatory Visit (INDEPENDENT_AMBULATORY_CARE_PROVIDER_SITE_OTHER): Payer: BC Managed Care – PPO | Admitting: Podiatry

## 2016-10-06 ENCOUNTER — Encounter: Payer: Self-pay | Admitting: Podiatry

## 2016-10-06 DIAGNOSIS — M7751 Other enthesopathy of right foot: Secondary | ICD-10-CM

## 2016-10-06 DIAGNOSIS — M779 Enthesopathy, unspecified: Principal | ICD-10-CM

## 2016-10-06 DIAGNOSIS — M778 Other enthesopathies, not elsewhere classified: Secondary | ICD-10-CM

## 2016-10-06 MED ORDER — DICLOFENAC SODIUM 75 MG PO TBEC: 75.0000 mg | DELAYED_RELEASE_TABLET | Freq: Two times a day (BID) | ORAL | 3 refills | Status: DC

## 2016-10-06 MED ORDER — METHYLPREDNISOLONE 4 MG PO TBPK: ORAL_TABLET | ORAL | 0 refills | Status: DC

## 2016-10-06 NOTE — Progress Notes (Signed)
She presents today for follow-up. She states that her capsulitis is returned and she points between second and third toes of the right foot. She states the same problem I think. She states that she was doing very well until just the last week or so. She was last seen 12/19/2015.  Objective: Vital signs are stable alert and oriented 3. Pulses are palpable. She has pain on end range of motion of the second third toes and pain on palpation of the second and third metatarsophalangeal joints.  Assessment: Capsulitis second and third metatarsophalangeal joints right foot.  Plan: Discussed appropriate shoe gear stretching exercises ice therapy also injected between the second and third metatarsophalangeal joints today with Kenalog and local anesthetic Cerner on a Medrol Dosepak to be followed by diclofenac.

## 2016-10-21 ENCOUNTER — Telehealth: Payer: Self-pay | Admitting: Podiatry

## 2016-10-21 NOTE — Telephone Encounter (Signed)
Pt said Medication is not working. Her foot is hurting more now than before. Shes going out of town this weekend.

## 2016-11-03 ENCOUNTER — Ambulatory Visit (INDEPENDENT_AMBULATORY_CARE_PROVIDER_SITE_OTHER): Payer: BC Managed Care – PPO | Admitting: Podiatry

## 2016-11-03 ENCOUNTER — Encounter: Payer: Self-pay | Admitting: Podiatry

## 2016-11-03 DIAGNOSIS — M778 Other enthesopathies, not elsewhere classified: Secondary | ICD-10-CM

## 2016-11-03 DIAGNOSIS — M779 Enthesopathy, unspecified: Principal | ICD-10-CM

## 2016-11-03 DIAGNOSIS — M722 Plantar fascial fibromatosis: Secondary | ICD-10-CM | POA: Diagnosis not present

## 2016-11-03 DIAGNOSIS — M7751 Other enthesopathy of right foot: Secondary | ICD-10-CM

## 2016-11-03 MED ORDER — IBUPROFEN 600 MG PO TABS: 600.0000 mg | ORAL_TABLET | Freq: Three times a day (TID) | ORAL | 3 refills | Status: DC | PRN

## 2016-11-03 NOTE — Progress Notes (Signed)
She presents today stating that her capsulitis of the forefoot is really not improved at all. She states that is still hurting his left foot does hurts everywhere.  Objective: She still has pain on palpation of plantar fascia as well as the forefoot. Her orthotics probably need to be replaced. She has near tennis shoes.  Assessment: Capsulitis plantar fasciitis right foot.  Plan: We get her into  Orthotics and I'll follow-up with her once the cement. We may do injections at that time. I did write a prescription for Motrin since she states the diclofenac was not working.

## 2016-11-12 ENCOUNTER — Encounter: Payer: Self-pay | Admitting: *Deleted

## 2016-12-01 ENCOUNTER — Encounter: Payer: BC Managed Care – PPO | Admitting: Orthotics

## 2017-01-01 ENCOUNTER — Other Ambulatory Visit: Payer: Self-pay | Admitting: Obstetrics and Gynecology

## 2017-03-02 ENCOUNTER — Other Ambulatory Visit: Payer: Self-pay | Admitting: Obstetrics and Gynecology

## 2017-03-09 ENCOUNTER — Encounter (HOSPITAL_COMMUNITY): Payer: Self-pay | Admitting: *Deleted

## 2017-03-10 DIAGNOSIS — M722 Plantar fascial fibromatosis: Secondary | ICD-10-CM | POA: Insufficient documentation

## 2017-03-10 NOTE — Patient Instructions (Signed)
Your procedure is scheduled on: Monday, Dec. 10, 2018  Enter through the [employer] [facility] at:  11:30 AM  Pick up the phone at the desk and dial [phone].  Call this number if you have problems the morning of surgery: [phone].  Remember: Do NOT eat food:  After Midnight Sunday  Do NOT drink clear liquids after:  9:00 AM Monday  Take these medicines the morning of surgery with a SIP OF WATER:  Zyrtec  Stop ALL herbal medications at this time  Do NOT smoke the day of surgery.  Do NOT wear jewelry (body piercing), metal hair clips/bobby pins, make-up, artifical eyelashes or nail polish. Do NOT wear lotions, powders, or perfumes.  You may wear deodorant. Do NOT shave for 48 hours prior to surgery. Do NOT bring valuables to the hospital. Contacts, dentures, or bridgework may not be worn into surgery.  Leave suitcase in car.  After surgery it may be brought to your room.  For patients admitted to the hospital, checkout time is 11:00 AM the day of discharge.  Bring a copy of your healthcare power of attorney and living will documents.  [place] - Preparing for Surgery Before surgery, you can play an important role.  Because skin is not sterile, your skin needs to be as free of germs as possible.  You can reduce the number of germs on your skin by washing with CHG (chlorahexidine gluconate) soap before surgery.  CHG is an antiseptic cleaner which kills germs and bonds with the skin to continue killing germs even after washing. Please DO NOT use if you have an allergy to CHG or antibacterial soaps.  If your skin becomes reddened/irritated stop using the CHG and inform your nurse when you arrive at Short Stay. Do not shave (including legs and underarms) for at least 48 hours prior to the first CHG shower.  You may shave your face/neck.  Please follow these instructions carefully:  1.  Shower with CHG Soap the night before surgery and the  morning of surgery.  2.  If  you choose to wash your hair, wash your hair first as usual with your normal  shampoo.  3.  After you shampoo, rinse your hair and body thoroughly to remove the shampoo.                             4.  Use CHG as you would any other liquid soap.  You can apply chg directly to the skin and wash.  Gently with a scrungie or clean washcloth.  5.  Apply the CHG Soap to your body ONLY FROM THE NECK DOWN.   Do   not use on face/ open                           Wound or open sores. Avoid contact with eyes, ears mouth and   genitals (private parts).                       Wash face,  Genitals (private parts) with your normal soap.             6.  Wash thoroughly, paying special attention to the area where your    surgery  will be performed.  7.  Thoroughly rinse your body with warm water from the neck down.  8.  DO NOT shower/wash with  your normal soap after using and rinsing off the CHG Soap.                9.  Pat yourself dry with a clean towel.            10.  Wear clean pajamas.            11.  Place clean sheets on your bed the night of your first shower and do not  sleep with pets. Day of Surgery : Do not apply any lotions/deodorants the morning of surgery.  Please wear clean clothes to the hospital/surgery center.  FAILURE TO FOLLOW THESE INSTRUCTIONS MAY RESULT IN THE CANCELLATION OF YOUR SURGERY  PATIENT SIGNATURE_________________________________  NURSE SIGNATURE__________________________________  ________________________________________________________________________

## 2017-03-10 NOTE — H&P (Signed)
Kayla Jimenez is a 41 y.o. female, P: 1-1-0-2 who presents for hysterectomy because of irregular vaginal bleeding. This patient has a history of menorrhagia and irregular bleeding for which she underwent an endometrial ablation in 2013. She continued to bleed after that procedure but flow was very light  and regular until March 2018 when she developed a pattern of bleeding 2-3 times a month.  These episodes would last for 5 days requiring the change of a pad 3 times a day.  The cramping that accompanied this bleeding was rated 5-7/10 on a 10 point pain scale but relieved with Ibuprofen 600 mg. Though placed on norethindrone 5 mg daily for this, her bleeding pattern persisted.  In September 2018 an endometrial biopsy returned weakly proliferative endometrium with no hyperplasia or malignancy At that same time,   TSH and CBC were normal and a sono-hysterogram showed: anteverted uterus: 5.55 x 5.43 x 6.62 cm; with appearance of adenomyosis, endometrium: 0.29 cm; right ovary-2.77 cm and left ovary-2.28 cm; no obvious masses seen, though cavity did not completely fill due to history of ablation; adhesions noted at fundus Patient admits to positional dyspareunia but denies any changes in bowel or bladder function. A review of both medical and surgical management options were given to the patient however, she wants to proceed with definitive therapy in the form of hysterectomy.   Past Medical History  OB History: G: 2;  P: 1-1-0-2;  SVB 2003 and 2011  GYN History: menarche: 41 YO    LMP: see HPI    Contracepton vasectomy  The patient denies history of sexually transmitted disease.  Denies history of abnormal PAP smear.   Last PAP smear: 2017 normal with negative HPV  Medical History: Pregnancy Induced Hypertension,  Post Partum Depression, Right Breast Fibroadenoma,  PMS, Alopecia, Anxiety,   ADD  and  Right Plantar Fasciitis.  Surgical History: 1982 Tonsillectomy;  2004 Reduction Mammoplasty; 2013  Endometrial Ablation and Placement of Tension Free Vaginal Tape; and 2016 Cholecystectomy  Denies problems with anesthesia except PONV; no history of blood transfusions  Family History: Cervical and Colon Cancer  Social History: Married and employed as a Pharmacist, hospital; Denies tobacco or alcohol use  Medication: Amphetamine Salt Combo 30 mg daily Vitamin D 50, 000 units weekly Eszopiclone 2 mg qhs prn Ferrous Sulfate 325 mg daily Ibuprofen 600 mg with food every 6 hours prn Montelukast 10 mg daily Sertraline 50 mg daily   Allergies  Allergen Reactions  . Percocet [Oxycodone-Acetaminophen] Itching  . Sulfa Antibiotics Itching  . Sulfonamide Derivatives     REACTION: Eyes Itch    Denies sensitivity to peanuts, shellfish, soy, latex or adhesives.   ROS: Admits to nasal congestion, headaches on occasion but denies corrective lenses,  vision changes,  dysphagia, tinnitus, dizziness, hoarseness, cough,  chest pain, shortness of breath, nausea, vomiting, diarrhea,constipation,  urinary frequency, urgency  dysuria, hematuria, vaginitis symptoms, pelvic pain, swelling of joints,easy bruising,  myalgias, arthralgias, skin rashes, unexplained weight loss and except as is mentioned in the history of present illness, patient's review of systems is otherwise negative.     Physical Exam  Bp: 118/72   P: 88 bpm    R: 20  Temperature: 99.2 degrees F orally   Weight 145 lbs.  Height: 5\' 3"   BMI: 25.7   Neck: supple without masses or thyromegaly Lungs: clear to auscultation Heart: regular rate and rhythm  Abdomen: soft, non-tender and no organomegaly Pelvic (from 11/18/16-patient declined exam at pre-op):EGBUS- wnl; vagina-normal rugae;  uterus-normal size, cervix without lesions or motion tenderness; adnexae-no tenderness or masses Extremities:  no clubbing, cyanosis or edema   Assesment:  Irregular Bleeding                       S/P Endometrial Ablation   Disposition:  A discussion was held  with patient regarding the indication for her procedure(s) along with the risks, which include but are not limited to:: reaction to anesthesia, damage to adjacent organs, infection and excessive bleeding. The patient verbalized understanding of these risks and has consented to proceed with a Total Vaginal Hysterectomy with Bilateral Salpingectomy and Possible Cystoscopy at [facility] on March 16, 2017 @ 9 a.m.   CSN# [id number]   Zalan Shidler J. Florene Glen, PA-C  for Dr. Harvie Bridge. Mancel Bale

## 2017-03-12 ENCOUNTER — Encounter (HOSPITAL_COMMUNITY)
Admission: RE | Admit: 2017-03-12 | Discharge: 2017-03-12 | Disposition: A | Discharge status: Home / Self Care | Payer: BC Managed Care – PPO | Source: Ambulatory Visit | Attending: Obstetrics and Gynecology | Admitting: Obstetrics and Gynecology

## 2017-03-12 ENCOUNTER — Other Ambulatory Visit: Payer: Self-pay

## 2017-03-12 ENCOUNTER — Encounter (HOSPITAL_COMMUNITY): Payer: Self-pay

## 2017-03-12 DIAGNOSIS — Z01812 Encounter for preprocedural laboratory examination: Secondary | ICD-10-CM | POA: Diagnosis present

## 2017-03-12 DIAGNOSIS — N920 Excessive and frequent menstruation with regular cycle: Secondary | ICD-10-CM | POA: Insufficient documentation

## 2017-03-12 HISTORY — DX: Plantar fascial fibromatosis: M72.2

## 2017-03-12 HISTORY — DX: Chronic sinusitis, unspecified: J32.9

## 2017-03-12 HISTORY — DX: Personal history of urinary calculi: Z87.442

## 2017-03-12 LAB — CBC
HCT: 40.6 % (ref 36.0–46.0)
Hemoglobin: 13.7 g/dL (ref 12.0–15.0)
MCH: 32.9 pg (ref 26.0–34.0)
MCHC: 33.7 g/dL (ref 30.0–36.0)
MCV: 97.4 fL (ref 78.0–100.0)
PLATELETS: 305 10*3/uL (ref 150–400)
RBC: 4.17 MIL/uL (ref 3.87–5.11)
RDW: 11.5 % (ref 11.5–15.5)
WBC: 12.2 10*3/uL — ABNORMAL HIGH (ref 4.0–10.5)

## 2017-03-13 ENCOUNTER — Ambulatory Visit: Payer: BC Managed Care – PPO | Admitting: Podiatry

## 2017-03-13 ENCOUNTER — Ambulatory Visit (INDEPENDENT_AMBULATORY_CARE_PROVIDER_SITE_OTHER): Payer: BC Managed Care – PPO

## 2017-03-13 ENCOUNTER — Inpatient Hospital Stay (HOSPITAL_COMMUNITY): Admission: RE | Admit: 2017-03-13 | Payer: BC Managed Care – PPO | Source: Ambulatory Visit

## 2017-03-13 ENCOUNTER — Encounter: Payer: Self-pay | Admitting: Podiatry

## 2017-03-13 DIAGNOSIS — M7751 Other enthesopathy of right foot: Secondary | ICD-10-CM | POA: Diagnosis not present

## 2017-03-13 DIAGNOSIS — M779 Enthesopathy, unspecified: Secondary | ICD-10-CM

## 2017-03-14 ENCOUNTER — Encounter (HOSPITAL_COMMUNITY): Payer: Self-pay | Admitting: Anesthesiology

## 2017-03-16 ENCOUNTER — Ambulatory Visit (HOSPITAL_COMMUNITY): Payer: BC Managed Care – PPO | Admitting: Anesthesiology

## 2017-03-16 ENCOUNTER — Other Ambulatory Visit: Payer: Self-pay

## 2017-03-16 ENCOUNTER — Encounter (HOSPITAL_COMMUNITY)
Admission: RE | Disposition: A | Discharge status: Home / Self Care | Payer: Self-pay | Source: Ambulatory Visit | Attending: Obstetrics and Gynecology

## 2017-03-16 ENCOUNTER — Observation Stay (HOSPITAL_COMMUNITY)
Admission: RE | Admit: 2017-03-16 | Discharge: 2017-03-18 | Disposition: A | Discharge status: Home / Self Care | Payer: BC Managed Care – PPO | Source: Ambulatory Visit | Attending: Obstetrics and Gynecology | Admitting: Obstetrics and Gynecology

## 2017-03-16 ENCOUNTER — Encounter (HOSPITAL_COMMUNITY): Payer: Self-pay

## 2017-03-16 DIAGNOSIS — N920 Excessive and frequent menstruation with regular cycle: Principal | ICD-10-CM | POA: Insufficient documentation

## 2017-03-16 DIAGNOSIS — F419 Anxiety disorder, unspecified: Secondary | ICD-10-CM | POA: Diagnosis not present

## 2017-03-16 DIAGNOSIS — D72829 Elevated white blood cell count, unspecified: Secondary | ICD-10-CM | POA: Diagnosis not present

## 2017-03-16 DIAGNOSIS — Z79899 Other long term (current) drug therapy: Secondary | ICD-10-CM | POA: Insufficient documentation

## 2017-03-16 DIAGNOSIS — N926 Irregular menstruation, unspecified: Secondary | ICD-10-CM | POA: Diagnosis present

## 2017-03-16 HISTORY — PX: CYSTOSCOPY: SHX5120

## 2017-03-16 HISTORY — PX: VAGINAL HYSTERECTOMY: SHX2639

## 2017-03-16 LAB — DIFFERENTIAL
Basophils Absolute: 0 10*3/uL (ref 0.0–0.1)
Basophils Relative: 0 %
Eosinophils Absolute: 0 10*3/uL (ref 0.0–0.7)
Eosinophils Relative: 0 %
LYMPHS ABS: 0.8 10*3/uL (ref 0.7–4.0)
LYMPHS PCT: 4 %
MONO ABS: 0.2 10*3/uL (ref 0.1–1.0)
MONOS PCT: 1 %
NEUTROS ABS: 20 10*3/uL — AB (ref 1.7–7.7)
Neutrophils Relative %: 95 %

## 2017-03-16 LAB — CBC
HCT: 39.1 % (ref 36.0–46.0)
HEMOGLOBIN: 13 g/dL (ref 12.0–15.0)
MCH: 32 pg (ref 26.0–34.0)
MCHC: 33.2 g/dL (ref 30.0–36.0)
MCV: 96.3 fL (ref 78.0–100.0)
Platelets: 283 10*3/uL (ref 150–400)
RBC: 4.06 MIL/uL (ref 3.87–5.11)
RDW: 11.3 % — AB (ref 11.5–15.5)
WBC: 20.9 10*3/uL — AB (ref 4.0–10.5)

## 2017-03-16 LAB — HCG, SERUM, QUALITATIVE: PREG SERUM: NEGATIVE

## 2017-03-16 LAB — HM PAP SMEAR

## 2017-03-16 SURGERY — HYSTERECTOMY, VAGINAL
Anesthesia: General | Site: Vagina | Laterality: Bilateral

## 2017-03-16 MED ORDER — PROPOFOL 10 MG/ML IV BOLUS
INTRAVENOUS | Status: DC | PRN
  Administered 2017-03-16: 150 mg via INTRAVENOUS

## 2017-03-16 MED ORDER — ROCURONIUM BROMIDE 100 MG/10ML IV SOLN
INTRAVENOUS | Status: DC | PRN
  Administered 2017-03-16: 10 mg via INTRAVENOUS
  Administered 2017-03-16: 5 mg via INTRAVENOUS
  Administered 2017-03-16: 10 mg via INTRAVENOUS
  Administered 2017-03-16: 50 mg via INTRAVENOUS

## 2017-03-16 MED ORDER — LIDOCAINE HCL (CARDIAC) 20 MG/ML IV SOLN
INTRAVENOUS | Status: DC | PRN
  Administered 2017-03-16: 60 mg via INTRAVENOUS

## 2017-03-16 MED ORDER — SCOPOLAMINE 1 MG/3DAYS TD PT72
1.0000 | MEDICATED_PATCH | Freq: Once | TRANSDERMAL | Status: DC
  Administered 2017-03-16: 1.5 mg via TRANSDERMAL

## 2017-03-16 MED ORDER — FENTANYL CITRATE (PF) 250 MCG/5ML IJ SOLN
INTRAMUSCULAR | Status: AC
  Filled 2017-03-16: qty 5

## 2017-03-16 MED ORDER — SCOPOLAMINE 1 MG/3DAYS TD PT72
MEDICATED_PATCH | TRANSDERMAL | Status: AC
  Administered 2017-03-16: 1.5 mg via TRANSDERMAL
  Filled 2017-03-16: qty 1

## 2017-03-16 MED ORDER — DIPHENHYDRAMINE HCL 50 MG/ML IJ SOLN: 12.5000 mg | Freq: Four times a day (QID) | INTRAMUSCULAR | Status: DC | PRN

## 2017-03-16 MED ORDER — SUGAMMADEX SODIUM 200 MG/2ML IV SOLN
INTRAVENOUS | Status: AC
  Filled 2017-03-16: qty 2

## 2017-03-16 MED ORDER — FENTANYL CITRATE (PF) 100 MCG/2ML IJ SOLN
INTRAMUSCULAR | Status: DC | PRN
  Administered 2017-03-16: 100 ug via INTRAVENOUS
  Administered 2017-03-16: 50 ug via INTRAVENOUS
  Administered 2017-03-16: 100 ug via INTRAVENOUS
  Administered 2017-03-16 (×2): 50 ug via INTRAVENOUS

## 2017-03-16 MED ORDER — VASOPRESSIN 20 UNIT/ML IV SOLN
INTRAVENOUS | Status: AC
  Filled 2017-03-16: qty 1

## 2017-03-16 MED ORDER — DEXAMETHASONE SODIUM PHOSPHATE 10 MG/ML IJ SOLN
INTRAMUSCULAR | Status: AC
  Filled 2017-03-16: qty 1

## 2017-03-16 MED ORDER — SODIUM CHLORIDE 0.9 % IJ SOLN
INTRAMUSCULAR | Status: AC
  Filled 2017-03-16: qty 10

## 2017-03-16 MED ORDER — DEXAMETHASONE SODIUM PHOSPHATE 10 MG/ML IJ SOLN
INTRAMUSCULAR | Status: DC | PRN
  Administered 2017-03-16: 10 mg via INTRAVENOUS

## 2017-03-16 MED ORDER — ONDANSETRON HCL 4 MG/2ML IJ SOLN
INTRAMUSCULAR | Status: DC | PRN
  Administered 2017-03-16 (×2): 2 mg via INTRAVENOUS

## 2017-03-16 MED ORDER — METOCLOPRAMIDE HCL 5 MG/ML IJ SOLN
INTRAMUSCULAR | Status: DC | PRN
  Administered 2017-03-16: 10 mg via INTRAVENOUS

## 2017-03-16 MED ORDER — SUGAMMADEX SODIUM 200 MG/2ML IV SOLN
INTRAVENOUS | Status: DC | PRN
  Administered 2017-03-16: 130 mg via INTRAVENOUS

## 2017-03-16 MED ORDER — PROPOFOL 10 MG/ML IV BOLUS
INTRAVENOUS | Status: AC
  Filled 2017-03-16: qty 20

## 2017-03-16 MED ORDER — HYDROMORPHONE HCL 1 MG/ML IJ SOLN
0.2500 mg | INTRAMUSCULAR | Status: DC | PRN
  Administered 2017-03-16 (×2): 0.5 mg via INTRAVENOUS

## 2017-03-16 MED ORDER — IBUPROFEN 600 MG PO TABS
600.0000 mg | ORAL_TABLET | Freq: Four times a day (QID) | ORAL | Status: DC | PRN
  Administered 2017-03-17 – 2017-03-18 (×3): 600 mg via ORAL
  Filled 2017-03-16 (×3): qty 1

## 2017-03-16 MED ORDER — LIDOCAINE HCL (CARDIAC) 20 MG/ML IV SOLN
INTRAVENOUS | Status: AC
  Filled 2017-03-16: qty 5

## 2017-03-16 MED ORDER — SERTRALINE HCL 50 MG PO TABS
75.0000 mg | ORAL_TABLET | Freq: Every day | ORAL | Status: DC
  Administered 2017-03-16 – 2017-03-17 (×2): 75 mg via ORAL
  Filled 2017-03-16 (×2): qty 1

## 2017-03-16 MED ORDER — ESTRADIOL 0.1 MG/GM VA CREA
TOPICAL_CREAM | VAGINAL | Status: AC
  Filled 2017-03-16: qty 42.5

## 2017-03-16 MED ORDER — HYDROMORPHONE HCL 1 MG/ML IJ SOLN
INTRAMUSCULAR | Status: AC
  Administered 2017-03-16: 0.5 mg via INTRAVENOUS
  Filled 2017-03-16: qty 1

## 2017-03-16 MED ORDER — METOCLOPRAMIDE HCL 5 MG/ML IJ SOLN
INTRAMUSCULAR | Status: AC
  Filled 2017-03-16: qty 2

## 2017-03-16 MED ORDER — KETOROLAC TROMETHAMINE 30 MG/ML IJ SOLN
INTRAMUSCULAR | Status: AC
  Filled 2017-03-16: qty 1

## 2017-03-16 MED ORDER — FENTANYL CITRATE (PF) 100 MCG/2ML IJ SOLN
INTRAMUSCULAR | Status: AC
  Filled 2017-03-16: qty 2

## 2017-03-16 MED ORDER — CEFOTETAN DISODIUM-DEXTROSE 2-2.08 GM-%(50ML) IV SOLR
2.0000 g | INTRAVENOUS | Status: AC
  Administered 2017-03-16: 2 g via INTRAVENOUS

## 2017-03-16 MED ORDER — AMPHETAMINE-DEXTROAMPHETAMINE 30 MG PO TABS: 15.0000 mg | ORAL_TABLET | Freq: Every day | ORAL | Status: DC | PRN

## 2017-03-16 MED ORDER — NALOXONE HCL 0.4 MG/ML IJ SOLN: 0.4000 mg | INTRAMUSCULAR | Status: DC | PRN

## 2017-03-16 MED ORDER — FLUMAZENIL 0.5 MG/5ML IV SOLN
INTRAVENOUS | Status: DC | PRN
  Administered 2017-03-16: 0.3 mg via INTRAVENOUS

## 2017-03-16 MED ORDER — FERROUS SULFATE 325 (65 FE) MG PO TABS
325.0000 mg | ORAL_TABLET | Freq: Every day | ORAL | Status: DC
  Administered 2017-03-17 – 2017-03-18 (×2): 325 mg via ORAL
  Filled 2017-03-16 (×2): qty 1

## 2017-03-16 MED ORDER — LACTATED RINGERS IV SOLN
INTRAVENOUS | Status: DC
Start: 2017-03-16 — End: 2017-03-18
  Administered 2017-03-16: 23:00:00 via INTRAVENOUS

## 2017-03-16 MED ORDER — NALOXONE HCL 0.4 MG/ML IJ SOLN
INTRAMUSCULAR | Status: DC | PRN
  Administered 2017-03-16: 40 ug via INTRAVENOUS

## 2017-03-16 MED ORDER — VASOPRESSIN 20 UNIT/ML IV SOLN
INTRAVENOUS | Status: DC | PRN
  Administered 2017-03-16: 14 mL via INTRAMUSCULAR

## 2017-03-16 MED ORDER — KETOROLAC TROMETHAMINE 30 MG/ML IJ SOLN
INTRAMUSCULAR | Status: DC | PRN
  Administered 2017-03-16: 30 mg via INTRAVENOUS

## 2017-03-16 MED ORDER — SODIUM CHLORIDE 0.9% FLUSH: 9.0000 mL | INTRAVENOUS | Status: DC | PRN

## 2017-03-16 MED ORDER — ONDANSETRON HCL 4 MG/2ML IJ SOLN: 4.0000 mg | Freq: Four times a day (QID) | INTRAMUSCULAR | Status: DC | PRN

## 2017-03-16 MED ORDER — MIDAZOLAM HCL 2 MG/2ML IJ SOLN
INTRAMUSCULAR | Status: DC | PRN
  Administered 2017-03-16: 0.5 mg via INTRAVENOUS
  Administered 2017-03-16: 1.5 mg via INTRAVENOUS

## 2017-03-16 MED ORDER — DIPHENHYDRAMINE HCL 50 MG/ML IJ SOLN
INTRAMUSCULAR | Status: AC
  Filled 2017-03-16: qty 1

## 2017-03-16 MED ORDER — CEFOTETAN DISODIUM-DEXTROSE 2-2.08 GM-%(50ML) IV SOLR
INTRAVENOUS | Status: AC
  Filled 2017-03-16: qty 50

## 2017-03-16 MED ORDER — DIPHENHYDRAMINE HCL 12.5 MG/5ML PO ELIX
12.5000 mg | ORAL_SOLUTION | Freq: Four times a day (QID) | ORAL | Status: DC | PRN
  Administered 2017-03-17: 12.5 mg via ORAL
  Filled 2017-03-16: qty 5

## 2017-03-16 MED ORDER — HYDROMORPHONE HCL 2 MG PO TABS
2.0000 mg | ORAL_TABLET | ORAL | Status: DC | PRN
  Administered 2017-03-17 – 2017-03-18 (×7): 2 mg via ORAL
  Filled 2017-03-16 (×7): qty 1

## 2017-03-16 MED ORDER — SODIUM CHLORIDE 0.9 % IJ SOLN
INTRAMUSCULAR | Status: AC
  Filled 2017-03-16: qty 50

## 2017-03-16 MED ORDER — MENTHOL 3 MG MT LOZG: 1.0000 | LOZENGE | OROMUCOSAL | Status: DC | PRN

## 2017-03-16 MED ORDER — HYDROMORPHONE HCL 1 MG/ML IJ SOLN
INTRAMUSCULAR | Status: DC | PRN
  Administered 2017-03-16: 1 mg via INTRAVENOUS

## 2017-03-16 MED ORDER — NEOSTIGMINE METHYLSULFATE 10 MG/10ML IV SOLN
INTRAVENOUS | Status: AC
  Filled 2017-03-16: qty 1

## 2017-03-16 MED ORDER — GLYCOPYRROLATE 0.2 MG/ML IJ SOLN
INTRAMUSCULAR | Status: DC | PRN
  Administered 2017-03-16 (×2): 0.1 mg via INTRAVENOUS

## 2017-03-16 MED ORDER — ONDANSETRON HCL 4 MG/2ML IJ SOLN
INTRAMUSCULAR | Status: AC
  Filled 2017-03-16: qty 2

## 2017-03-16 MED ORDER — HYDROMORPHONE 1 MG/ML IV SOLN
INTRAVENOUS | Status: DC
  Administered 2017-03-16: 19:00:00 via INTRAVENOUS
  Administered 2017-03-16: 2 mg via INTRAVENOUS
  Administered 2017-03-17: 1.6 mg via INTRAVENOUS
  Filled 2017-03-16: qty 25

## 2017-03-16 MED ORDER — ROCURONIUM BROMIDE 100 MG/10ML IV SOLN
INTRAVENOUS | Status: AC
  Filled 2017-03-16: qty 1

## 2017-03-16 MED ORDER — FLUMAZENIL 0.5 MG/5ML IV SOLN
INTRAVENOUS | Status: AC
  Filled 2017-03-16: qty 5

## 2017-03-16 MED ORDER — ONDANSETRON HCL 4 MG PO TABS: 4.0000 mg | ORAL_TABLET | Freq: Three times a day (TID) | ORAL | Status: DC | PRN

## 2017-03-16 MED ORDER — HYDROMORPHONE HCL 1 MG/ML IJ SOLN
INTRAMUSCULAR | Status: AC
  Filled 2017-03-16: qty 1

## 2017-03-16 MED ORDER — LACTATED RINGERS IV SOLN
INTRAVENOUS | Status: DC
  Administered 2017-03-16: 16:00:00 via INTRAVENOUS
  Administered 2017-03-16: 1000 mL via INTRAVENOUS
  Administered 2017-03-16 (×2): via INTRAVENOUS

## 2017-03-16 MED ORDER — MIDAZOLAM HCL 2 MG/2ML IJ SOLN
INTRAMUSCULAR | Status: AC
  Filled 2017-03-16: qty 2

## 2017-03-16 MED ORDER — DIPHENHYDRAMINE HCL 50 MG/ML IJ SOLN
INTRAMUSCULAR | Status: DC | PRN
  Administered 2017-03-16: 12.5 mg via INTRAVENOUS

## 2017-03-16 MED ORDER — STERILE WATER FOR IRRIGATION IR SOLN
Status: DC | PRN
  Administered 2017-03-16: 1000 mL

## 2017-03-16 MED ORDER — DOCUSATE SODIUM 100 MG PO CAPS
100.0000 mg | ORAL_CAPSULE | Freq: Two times a day (BID) | ORAL | Status: DC
  Administered 2017-03-16 – 2017-03-18 (×4): 100 mg via ORAL
  Filled 2017-03-16 (×4): qty 1

## 2017-03-16 MED ORDER — GLYCOPYRROLATE 0.2 MG/ML IJ SOLN
INTRAMUSCULAR | Status: AC
  Filled 2017-03-16: qty 3

## 2017-03-16 SURGICAL SUPPLY — 32 items
CANISTER SUCT 3000ML PPV (MISCELLANEOUS) ×3 IMPLANT
CLOTH BEACON ORANGE TIMEOUT ST (SAFETY) ×3 IMPLANT
CONT PATH 16OZ SNAP LID 3702 (MISCELLANEOUS) ×3 IMPLANT
DECANTER SPIKE VIAL GLASS SM (MISCELLANEOUS) ×3 IMPLANT
DRAPE SHEET LG 3/4 BI-LAMINATE (DRAPES) ×3 IMPLANT
DRAPE STERI URO 9X17 APER PCH (DRAPES) ×3 IMPLANT
GLOVE BIO SURGEON STRL SZ7.5 (GLOVE) ×3 IMPLANT
GLOVE BIOGEL PI IND STRL 6.5 (GLOVE) ×2 IMPLANT
GLOVE BIOGEL PI IND STRL 7.0 (GLOVE) ×2 IMPLANT
GLOVE BIOGEL PI IND STRL 7.5 (GLOVE) ×2 IMPLANT
GLOVE BIOGEL PI INDICATOR 6.5 (GLOVE) ×1
GLOVE BIOGEL PI INDICATOR 7.0 (GLOVE) ×1
GLOVE BIOGEL PI INDICATOR 7.5 (GLOVE) ×1
GOWN STRL REUS W/TWL LRG LVL3 (GOWN DISPOSABLE) ×9 IMPLANT
HEMOSTAT SURGICEL 2X3 (HEMOSTASIS) ×3 IMPLANT
NEEDLE HYPO 22GX1.5 SAFETY (NEEDLE) ×3 IMPLANT
NEEDLE MAYO CATGUT SZ4 (NEEDLE) IMPLANT
NS IRRIG 1000ML POUR BTL (IV SOLUTION) ×3 IMPLANT
PACK VAGINAL WOMENS (CUSTOM PROCEDURE TRAY) ×3 IMPLANT
PAD OB MATERNITY 4.3X12.25 (PERSONAL CARE ITEMS) ×3 IMPLANT
SET CYSTO W/LG BORE CLAMP LF (SET/KITS/TRAYS/PACK) ×3 IMPLANT
SUT VIC AB 0 CT1 18XCR BRD8 (SUTURE) ×8 IMPLANT
SUT VIC AB 0 CT1 27 (SUTURE)
SUT VIC AB 0 CT1 27XBRD ANBCTR (SUTURE) IMPLANT
SUT VIC AB 0 CT1 8-18 (SUTURE) ×4
SUT VIC AB 2-0 SH 27 (SUTURE) ×3
SUT VIC AB 2-0 SH 27XBRD (SUTURE) ×6 IMPLANT
SUT VICRYL 0 TIES 12 18 (SUTURE) ×3 IMPLANT
SYR BULB IRRIGATION 50ML (SYRINGE) ×3 IMPLANT
SYR TB 1ML 25GX5/8 (SYRINGE) ×3 IMPLANT
TOWEL OR 17X24 6PK STRL BLUE (TOWEL DISPOSABLE) ×6 IMPLANT
TRAY FOLEY CATH SILVER 14FR (SET/KITS/TRAYS/PACK) ×3 IMPLANT

## 2017-03-16 NOTE — Interval H&P Note (Signed)
History and Physical Interval Note:  03/16/2017 12:03 PM  Kayla Jimenez  has presented today for surgery, with the diagnosis of Menorrhagia  The various methods of treatment have been discussed with the patient and family. After consideration of risks, benefits and other options for treatment, the patient has consented to  Procedure(s) with comments: HYSTERECTOMY VAGINAL Bilateral Salpingectomy  (Bilateral) - 2 Hours as a surgical intervention possible cystoscopy, possible laparoscopy, possible abdominal hysterectomy.  The patient's history has been reviewed, patient examined, no change in status, stable for surgery.  I have reviewed the patient's chart and labs.  Questions were answered to the patient's satisfaction.     [name]

## 2017-03-16 NOTE — Transfer of Care (Signed)
Immediate Anesthesia Transfer of Care Note  Patient: Kayla Jimenez  Procedure(s) Performed: HYSTERECTOMY VAGINAL Bilateral Salpingectomy  (Bilateral Vagina ) CYSTOSCOPY (Urethra)  Patient Location: PACU  Anesthesia Type:General  Level of Consciousness: awake, alert  and oriented  Airway & Oxygen Therapy: Patient Spontanous Breathing and Patient connected to nasal cannula oxygen  Post-op Assessment: Report given to RN and Post -op Vital signs reviewed and stable  Post vital signs: Reviewed and stable  Last Vitals:  Vitals:   03/16/17 1200  BP: (!) 145/96  Pulse: 71  Resp: 16  Temp: 37.1 C  SpO2: 99%    Last Pain:  Vitals:   03/16/17 1200  TempSrc: Oral      Patients Stated Pain Goal: 3 (84/53/64 6803)  Complications: No apparent anesthesia complications

## 2017-03-16 NOTE — Anesthesia Procedure Notes (Signed)
Procedure Name: Intubation Date/Time: 03/16/2017 1:14 PM Performed by: Albertha Ghee, MD Pre-anesthesia Checklist: Patient identified, Patient being monitored, Timeout performed, Emergency Drugs available and Suction available Patient Re-evaluated:Patient Re-evaluated prior to induction Oxygen Delivery Method: Circle System Utilized Preoxygenation: Pre-oxygenation with 100% oxygen Induction Type: IV induction Ventilation: Mask ventilation without difficulty Laryngoscope Size: Miller and 2 Grade View: Grade II Tube type: Oral Tube size: 7.0 mm Number of attempts: 1 Airway Equipment and Method: stylet Placement Confirmation: ETT inserted through vocal cords under direct vision,  positive ETCO2 and breath sounds checked- equal and bilateral Secured at: 21 cm Tube secured with: Tape Dental Injury: Teeth and Oropharynx as per pre-operative assessment

## 2017-03-16 NOTE — Anesthesia Preprocedure Evaluation (Signed)
Anesthesia Evaluation  Patient identified by MRN, date of birth, ID band Patient awake    Reviewed: Allergy & Precautions, H&P , NPO status , Patient's Chart, lab work & pertinent test results  History of Anesthesia Complications (+) PONV and history of anesthetic complications  Airway Mallampati: II   Neck ROM: full    Dental   Pulmonary neg pulmonary ROS,    breath sounds clear to auscultation       Cardiovascular negative cardio ROS   Rhythm:regular Rate:Normal     Neuro/Psych PSYCHIATRIC DISORDERS Anxiety    GI/Hepatic   Endo/Other    Renal/GU Renal diseasestones     Musculoskeletal   Abdominal   Peds  Hematology   Anesthesia Other Findings   Reproductive/Obstetrics                             Anesthesia Physical Anesthesia Plan  ASA: II  Anesthesia Plan: General   Post-op Pain Management:    Induction: Intravenous  PONV Risk Score and Plan: 4 or greater and Ondansetron, Dexamethasone, Midazolam, Scopolamine patch - Pre-op and Treatment may vary due to age or medical condition  Airway Management Planned: Oral ETT  Additional Equipment:   Intra-op Plan:   Post-operative Plan: Extubation in OR  Informed Consent: I have reviewed the patients History and Physical, chart, labs and discussed the procedure including the risks, benefits and alternatives for the proposed anesthesia with the patient or authorized representative who has indicated his/her understanding and acceptance.     Plan Discussed with: CRNA, Anesthesiologist and Surgeon  Anesthesia Plan Comments:         Anesthesia Quick Evaluation

## 2017-03-16 NOTE — Op Note (Addendum)
Preop Diagnosis: 1.Menorrhagia 2.Adenomyosis   Postop Diagnosis: 1.Menorrhagia 2.Adenomyosis  Procedure: 1.HYSTERECTOMY VAGINAL 2.BILATERAL SALPINGECTOMY 3.CYSTOSCOPY  Anesthesia: General   Anesthesiologist: Albertha Ghee, MD   Attending: Everett Graff, MD   Assistant: E.Florene Glen, PA-C  Findings: Small uterus, left ovary and tube with very little descent, rt ovary and tube wnl.    Pathology: Uterus, cervix and bilateral tubes.  Left tube with stitch through 2 portions (fimbria crushed in clamp due to difficulty reaching the end because of minimal descent).  Fluids: 2200 cc  UOP: 300 cc  EBL: 277 cc  Complications: None  Procedure:The patient was taken to the operating room after the risks, benefits and alternatives were discussed with the patient. The patient verbalized understanding and consent signed and witnessed. The patient was placed under general anesthesia per the anesthesiologist and a timeout was performed per protocol. The patient was prepped and draped in the normal sterile fashion in the dorsal lithotomy position.  A weighted speculum was placed the patient's vagina and the anterior lip of the cervix was grasped with a single-tooth tenaculum and Dever retractors were placed for vaginal wall retraction. The cervix was circumscribed with the bovie after injecting the cervix with 20 cc of pitressin at a concentration of 20 units of Pitressin in 50 cc of normal saline.  Once the cervix was circumscribed the anterior cul-de-sac was attempted to be entered.  Attention was turned to the posterior cul-de-sac and the posterior cul-de-sac was entered without difficulty as well.  Curved Heaney clamps were used to clamp the uterosacral and cardinal ligaments and the tissue was then cut and suture ligated using 0 Vicryl. This was done bilaterally and sequentially and the remainder was done after attention turned back to the anterior cul-de-sac which was entered without difficulty.   The remaining parametrial tissue was clamped, cut and suture ligated using 0 Vicryl in a sequential and bilateral fashion as well.  The uterine fundus was exteriorized and the remaining pedicles were bilaterally clamped, cut and ligated and then suture ligated with 0 vicryl.  The uterus and cervix were handed off to be sent to pathology. The bilateral ovaries and fallopian tubes appeared to be within normal limits except for the findings as mentioned above. The right fallopian tube was grasped with a babcock and heaney clamp placed beneath it.  The tube was excised and suture ligated x 2 and sent to pathology.  The same was done on the left however due to minimal descent the tube was clamped with a right angle clamp in the proximal portion, excised and ligated then suture ligated.  The same was done in the distal portion incorporating the fimbria in the clamp.  It was difficult to get the clamp beneath the fimbria due to minimal descent.  Surgicel was placed on the left pedicles.  The angles of the cuff were sutured using 0 vicryl.  The cuff was then repaired to the midline with figure-of-eight and interrupted stitches of 0 Vicryl.  The cuff was noted to be hemostatic. The Foley was removed and cystoscopy was performed after administration of D10 for cytoscopy fluid. Bilateral ureters were noted to efflux without difficulty and there were no inadvertent bladder injuries. The bladder was then copiously irrigated with normal saline cytoscopy fluid. The Foley was replaced to gravity.  The patient tolerated the procedure well and was returned to the recovery room in good condition.

## 2017-03-16 NOTE — Discharge Instructions (Signed)
Call [facility] @ [phone] if:  You have a temperature greater than or equal to 100.4 degrees Farenheit orally You have pain that is not made better by the pain medication given and taken as directed You have excessive bleeding or problems urinating  Take Colace (Docusate Sodium/Stool Softener) 100 mg 2-3 times daily while taking narcotic pain medicine to avoid constipation or until bowel movements are regular. Take, with food, Ibuprofen 600 mg every 6 hours for 5 days then as needed for pain  You may drive after 2  weeks You may walk up steps  You may shower  You may resume a regular diet  Do not lift over 15 pounds for 6 weeks Avoid anything in vagina for 6 weeks (or until after your post-operative visit)

## 2017-03-16 NOTE — Progress Notes (Signed)
Day of Surgery Procedure(s) (LRB): HYSTERECTOMY VAGINAL Bilateral Salpingectomy  (Bilateral) CYSTOSCOPY  Subjective: Patient reports some low back and lower abdominal discomfort.    Objective: I have reviewed patient's vital signs and intake and output.  UOP 500cc/4  General: alert and no distress Resp: clear to auscultation bilaterally Cardio: regular rate and rhythm GI: slightly decreased BS in LLQ otherwise wnl Extremities: scds are on, no calf tenderness Vaginal Bleeding: none  Assessment: s/p Procedure(s): HYSTERECTOMY VAGINAL Bilateral Salpingectomy  (Bilateral) CYSTOSCOPY: stable  Plan: CBC this evening and then in the morning  UOP good Encourage IS SCDs for DVT prophylaxis    LOS: 0 days    [name] 03/16/2017, 7:23 PM

## 2017-03-16 NOTE — Progress Notes (Signed)
OR case scheduled at 1300.  Surgeon arrived at 1200.  OR not ready at 1311.

## 2017-03-17 ENCOUNTER — Encounter (HOSPITAL_COMMUNITY): Payer: Self-pay | Admitting: Obstetrics and Gynecology

## 2017-03-17 DIAGNOSIS — N920 Excessive and frequent menstruation with regular cycle: Secondary | ICD-10-CM | POA: Diagnosis not present

## 2017-03-17 LAB — CBC WITH DIFFERENTIAL/PLATELET
BASOS PCT: 0 %
Basophils Absolute: 0 10*3/uL (ref 0.0–0.1)
EOS ABS: 0 10*3/uL (ref 0.0–0.7)
EOS PCT: 0 %
HCT: 39.6 % (ref 36.0–46.0)
Hemoglobin: 13 g/dL (ref 12.0–15.0)
LYMPHS ABS: 2.8 10*3/uL (ref 0.7–4.0)
Lymphocytes Relative: 14 %
MCH: 32 pg (ref 26.0–34.0)
MCHC: 32.8 g/dL (ref 30.0–36.0)
MCV: 97.5 fL (ref 78.0–100.0)
Monocytes Absolute: 1.1 10*3/uL — ABNORMAL HIGH (ref 0.1–1.0)
Monocytes Relative: 5 %
Neutro Abs: 16.6 10*3/uL — ABNORMAL HIGH (ref 1.7–7.7)
Neutrophils Relative %: 81 %
PLATELETS: 300 10*3/uL (ref 150–400)
RBC: 4.06 MIL/uL (ref 3.87–5.11)
RDW: 11.3 % — ABNORMAL LOW (ref 11.5–15.5)
WBC: 20.4 10*3/uL — AB (ref 4.0–10.5)

## 2017-03-17 LAB — URINALYSIS, ROUTINE W REFLEX MICROSCOPIC
Bacteria, UA: NONE SEEN
Bilirubin Urine: NEGATIVE
GLUCOSE, UA: NEGATIVE mg/dL
Ketones, ur: NEGATIVE mg/dL
Leukocytes, UA: NEGATIVE
Nitrite: NEGATIVE
PH: 7 (ref 5.0–8.0)
Protein, ur: NEGATIVE mg/dL
Specific Gravity, Urine: 1.006 (ref 1.005–1.030)

## 2017-03-17 MED ORDER — NON FORMULARY
Status: DC | PRN
  Administered 2017-03-16: 500 mL

## 2017-03-17 MED ORDER — HYDROMORPHONE HCL 2 MG PO TABS: ORAL_TABLET | ORAL | 0 refills | Status: DC

## 2017-03-17 MED ORDER — IBUPROFEN 600 MG PO TABS: ORAL_TABLET | ORAL | 1 refills | Status: DC

## 2017-03-17 NOTE — Plan of Care (Signed)
  Progressing Clinical Measurements: Respiratory complications will improve 03/17/2017 0132 - Progressing by Ledell Noss, RN Note Pt using incentive spirometer as ordered  Nutrition: Adequate nutrition will be maintained 03/17/2017 0132 - Progressing by Ledell Noss, RN Note Pt tolerating clear liquid diet no N/V advance to regular in am Coping: Level of anxiety will decrease 03/17/2017 0132 - Progressing by Ledell Noss, RN Note Pt given time to ask questions. Pain Managment: General experience of comfort will improve 03/17/2017 0132 - Progressing by Ledell Noss, RN Note Pt using PCA for pain with good results, and heat to back and lower abd. Safety: Ability to remain free from injury will improve 03/17/2017 0132 - Progressing by Ledell Noss, RN Note Side rails up call light within reach instructed to call before attempting to get out of bed

## 2017-03-17 NOTE — Progress Notes (Addendum)
Kayla Jimenez is a63 y.o.  [id number]  Post Op Date # 1:  TVH/BS/Cystoscopy Subjective: Patient is Doing well postoperatively. Patient has Pain is controlled with current analgesics. Medications being used: narcotic analgesics including Dilaudid. Ambulated in the halls without difficulty, tolerated broth and other liquids but hasn't voided yet.    Objective: Vital signs in last 24 hours: Temp:  [98.2 F (36.8 C)-99.7 F (37.6 C)] 98.2 F (36.8 C) (12/11 0321) Pulse Rate:  [71-104] 72 (12/11 0321) Resp:  [10-18] 18 (12/11 0321) BP: (120-146)/(68-96) 124/76 (12/11 0321) SpO2:  [95 %-100 %] 98 % (12/11 0321) Weight:  [145 lb (65.8 kg)] 145 lb (65.8 kg) (12/10 2213)  Intake/Output from previous day: 12/10 0701 - 12/11 0700 In: [facility] [place] [P.O.:1070; I.V.:4300] Out: 6256 [Urine:4000] Intake/Output this shift: No intake/output data recorded. Recent Labs  Lab 03/12/17 1545 03/16/17 1936  WBC 12.2* 20.9*  HGB 13.7 13.0  HCT 40.6 39.1  PLT 305 283    No results for input(s): NA, K, CL, CO2, BUN, CREATININE, CALCIUM, PROT, BILITOT, ALKPHOS, ALT, AST, GLUCOSE in the last 168 hours.  Invalid input(s): LABALBU  EXAM: General: alert, cooperative and no distress Resp: clear to auscultation bilaterally Cardio: regular rate and rhythm, S1, S2 normal, no murmur, click, rub or gallop GI: bowel sounds present, soft Extremities: Homans sign is negative, no sign of DVT and no calf tenderness. Vaginal Bleeding: none and patient reports that there was some blood stain on previous perineal pad.   Assessment: s/p Procedure(s): HYSTERECTOMY VAGINAL Bilateral Salpingectomy  CYSTOSCOPY: stable, progressing well and Leukocytosis with history of the same.  Plan: Advance diet Encourage ambulation Advance to PO medication CBC  Routine care  LOS: 0 days    Earnstine Regal, PA-C 03/17/2017 7:10 AM   Pt with some urinary retention.  Good BSs and + flatus x1.  Voided 100 just now and did not  feel like she emptied and was cath'd for another 350cc.  Another TOV.  Pt without nausea and tolerated regular diet but mildly distended on exam.  Will cont to observe.  Pt voiding is improved but still feels like she is retaining some and continues to be mildly distended without nausea and tolerating po.  Will replace foley catheter overnight to rest bladder.

## 2017-03-17 NOTE — Anesthesia Postprocedure Evaluation (Signed)
Anesthesia Post Note  Patient: Kayla Jimenez  Procedure(s) Performed: HYSTERECTOMY VAGINAL Bilateral Salpingectomy  (Bilateral Vagina ) CYSTOSCOPY (Urethra)     Patient location during evaluation: [facility] Anesthesia Type: General Level of consciousness: awake Pain management: pain level controlled Vital Signs Assessment: post-procedure vital signs reviewed and stable Respiratory status: spontaneous breathing Cardiovascular status: stable Postop Assessment: adequate PO intake and no apparent nausea or vomiting Anesthetic complications: no    Last Vitals:  Vitals:   03/17/17 0321 03/17/17 0800  BP: 124/76 135/79  Pulse: 72 75  Resp: 18 18  Temp: 36.8 C 36.8 C  SpO2: 98% 100%    Last Pain:  Vitals:   03/17/17 0800  TempSrc: Oral  PainSc:    Pain Goal: Patients Stated Pain Goal: 3 (03/17/17 0615)               Everette Rank

## 2017-03-17 NOTE — Discharge Summary (Addendum)
Physician Discharge Summary  Patient ID: Kayla Jimenez MRN: [id number] DOB/AGE: 09-01-1975 41 y.o.  Admit date: 03/16/2017 Discharge date: 03/17/2017   Discharge Diagnoses: Irregular Bleeding and Leukocytosis Active Problems:   Irregular bleeding   Operation: Total Vaginal Hysterectomy, Bilateral Salpingectomy and Cystoscpy   Discharged Condition: Good  Hospital Course: On the date of admission the patient underwent the aforementioned procedures and tolerated  them well. Post operative course was remarkable for slow bowel and bladder function by post operative day #2 and was therefore deemed ready for discharge home.  Discharge hemoglobin stable and WBC count decreased to 12.5 from 20.4.  Discharge instructions reviewed.  Pt is passing flatus regularly and abdomen is improved and only minimally softly distended.  Pt has not had a bowel movement yet.  Instructions given.  If no BM by tomorrow evening, take a bottle of magnesium citrate as directed.  Pt voided 50 cc and then 200 cc and feels like she is mostly emptying.  Instructions given for minimal fluid intake over the next 48hrs and regular voids every 2-3hrs and I will see the pt in the office on Friday at 11am.  Disposition: 01-Home or Self Care   Exam: Lungs CTA CV RRR Abd soft, minimally distended (improved from yesterday), NABS Ext no calf tenderness No VB  Discharge Medications:  Allergies as of 03/17/2017      Reactions   Percocet [oxycodone-acetaminophen] Itching   Sulfa Antibiotics Itching   Sulfonamide Derivatives    REACTION: Eyes Itch      Medication List    TAKE these medications   amphetamine-dextroamphetamine 30 MG tablet Commonly known as:  ADDERALL Take 15-30 mg by mouth daily as needed (Takes only when at work to help focus).   cetirizine 10 MG tablet Commonly known as:  ZYRTEC Take 10 mg by mouth daily. Reported on 08/14/2015   eszopiclone 2 MG Tabs tablet Commonly known as:  LUNESTA Take 2 mg  by mouth at bedtime. Take immediately before bedtime   Urecholine 10mg  use as directed   HYDROmorphone 2 MG tablet Commonly known as:  DILAUDID 1  po every 6  hours as needed for post operative pain.   ibuprofen 600 MG tablet Commonly known as:  ADVIL,MOTRIN 1  po pc every 6 hours for 5 days then as needed for pain   montelukast 10 MG tablet Commonly known as:  SINGULAIR Take 10 mg by mouth at bedtime.   sertraline 50 MG tablet Commonly known as:  ZOLOFT Take 75 mg by mouth at bedtime.   Vitamin D (Ergocalciferol) 50000 units Caps capsule Commonly known as:  DRISDOL Take 50,000 Units by mouth every 7 (seven) days. Wednesdays          Follow-up: Dr. Harvie Bridge. Mancel Bale on April 22, 2016 at 9:30 a.m.   Signed: Earnstine Regal, PA-C 03/17/2017, 7:53 AM

## 2017-03-18 DIAGNOSIS — N920 Excessive and frequent menstruation with regular cycle: Secondary | ICD-10-CM | POA: Diagnosis not present

## 2017-03-18 LAB — URINE CULTURE: CULTURE: NO GROWTH

## 2017-03-18 LAB — CBC WITH DIFFERENTIAL/PLATELET
BASOS PCT: 0 %
Basophils Absolute: 0 10*3/uL (ref 0.0–0.1)
EOS ABS: 0.4 10*3/uL (ref 0.0–0.7)
Eosinophils Relative: 3 %
HEMATOCRIT: 37 % (ref 36.0–46.0)
HEMOGLOBIN: 12 g/dL (ref 12.0–15.0)
LYMPHS ABS: 3.8 10*3/uL (ref 0.7–4.0)
Lymphocytes Relative: 31 %
MCH: 32.3 pg (ref 26.0–34.0)
MCHC: 32.4 g/dL (ref 30.0–36.0)
MCV: 99.5 fL (ref 78.0–100.0)
MONO ABS: 0.4 10*3/uL (ref 0.1–1.0)
MONOS PCT: 4 %
NEUTROS PCT: 62 %
Neutro Abs: 7.8 10*3/uL — ABNORMAL HIGH (ref 1.7–7.7)
PLATELETS: 273 10*3/uL (ref 150–400)
RBC: 3.72 MIL/uL — ABNORMAL LOW (ref 3.87–5.11)
RDW: 11.7 % (ref 11.5–15.5)
WBC: 12.5 10*3/uL — ABNORMAL HIGH (ref 4.0–10.5)

## 2017-03-18 MED ORDER — BETHANECHOL CHLORIDE 10 MG PO TABS: 10.0000 mg | ORAL_TABLET | Freq: Three times a day (TID) | ORAL | 0 refills | Status: DC | PRN

## 2017-03-18 NOTE — Progress Notes (Signed)
Pt teaching complete  Follow up discussed  Out with husband

## 2017-03-19 NOTE — Progress Notes (Signed)
   HPI: 41 year old female presents today complaining of a flare up of capsulitis of the right foot that began 2-3 weeks ago. She reports associated swelling and states it fells as if she is walking on stones around the ball of the foot. Wearing orthotics, icing the foot and taking Ibuprofen help to alleviate the pain. Walking increases the pain. Patient is here for further evaluation and treatment.   Past Medical History:  Diagnosis Date  . Anxiety 08/29/10  . BV (bacterial vaginosis) 08/29/10  . Complication of anesthesia 2004   vomited  . Gross hematuria 07/08/2015  . H/O bladder infections   . H/O mumps   . H/O toxoplasmosis   . H/O varicella   . History of kidney stones   . Infections of kidney    several kidney stones, bladder infections  . Kidney disease 09/03/2011   Left ureteral calculus with partial obstruction, passed 06/09/2014. CT did show perinephric stranding.   . Menometrorrhagia 06/26/10  . Plantar fasciitis   . PMS (premenstrual syndrome) 07/10/11  . Post - coital bleeding 08/29/10  . Renal calculus, left   . Sinus infection   . UTI (lower urinary tract infection) 07/08/2015  . Yeast infection      Physical Exam: General: The patient is alert and oriented x3 in no acute distress.  Dermatology: Skin is warm, dry and supple bilateral lower extremities. Negative for open lesions or macerations.  Vascular: Palpable pedal pulses bilaterally. No edema or erythema noted. Capillary refill within normal limits.  Neurological: Epicritic and protective threshold grossly intact bilaterally.   Musculoskeletal Exam: Pain with palpation of the 2nd and 3rd MPJs of the right foot. Range of motion within normal limits to all pedal and ankle joints bilateral. Muscle strength 5/5 in all groups bilateral.   Radiographic Exam:  Normal osseous mineralization. Joint spaces preserved. No fracture/dislocation/boney destruction.    Assessment: - 2nd and 3rd MPJ capsulitis right   Plan of  Care:  - Patient evaluated. X-Rays reviewed.  - Injection of 0.5 mLs Celestone Soluspan injected into the 2nd and 3rd MPJs of the right foot.  - Continue wearing custom molded orthotics from Dr. Milinda Pointer.  - Return to clinic in 6 weeks.   Having hysterectomy on 03/16/17.    Edrick Kins, DPM [facility]  Dr. Edrick Kins, DPM    [address], [place] [address]                Office [phone]  Fax [phone]

## 2017-04-17 ENCOUNTER — Telehealth: Payer: Self-pay | Admitting: Hematology

## 2017-04-17 NOTE — Telephone Encounter (Signed)
Unable to reach patient regarding appointment & Referring MD office contacted with info as well. Schedule mailed to patient as well

## 2017-04-20 ENCOUNTER — Other Ambulatory Visit: Payer: Self-pay

## 2017-04-20 ENCOUNTER — Ambulatory Visit: Payer: Self-pay | Admitting: Nurse Practitioner

## 2017-04-20 MED ORDER — VITAMIN D (ERGOCALCIFEROL) 1.25 MG (50000 UNIT) PO CAPS: 50000.0000 [IU] | ORAL_CAPSULE | ORAL | 1 refills | Status: DC

## 2017-04-21 ENCOUNTER — Ambulatory Visit: Payer: BC Managed Care – PPO | Admitting: Nurse Practitioner

## 2017-04-21 ENCOUNTER — Encounter: Payer: Self-pay | Admitting: Nurse Practitioner

## 2017-04-21 VITALS — BP 110/80 | HR 89 | Temp 98.6°F | Resp 16 | Ht 63.0 in | Wt 147.0 lb

## 2017-04-21 DIAGNOSIS — R41840 Attention and concentration deficit: Secondary | ICD-10-CM

## 2017-04-21 DIAGNOSIS — B370 Candidal stomatitis: Secondary | ICD-10-CM

## 2017-04-21 DIAGNOSIS — H60501 Unspecified acute noninfective otitis externa, right ear: Secondary | ICD-10-CM

## 2017-04-21 DIAGNOSIS — F5101 Primary insomnia: Secondary | ICD-10-CM | POA: Diagnosis not present

## 2017-04-21 MED ORDER — NEOMYCIN-POLYMYXIN-HC 3.5-10000-1 OT SOLN: 4.0000 [drp] | Freq: Three times a day (TID) | OTIC | 0 refills | Status: DC

## 2017-04-21 MED ORDER — AMPHETAMINE-DEXTROAMPHET ER 30 MG PO CP24: 30.0000 mg | ORAL_CAPSULE | Freq: Every day | ORAL | 0 refills | Status: DC

## 2017-04-21 MED ORDER — NYSTATIN 100000 UNIT/ML MT SUSP: OROMUCOSAL | 1 refills | Status: DC

## 2017-04-21 MED ORDER — ESZOPICLONE 2 MG PO TABS: 2.0000 mg | ORAL_TABLET | Freq: Every day | ORAL | 3 refills | Status: DC

## 2017-04-21 NOTE — Progress Notes (Signed)
[facility], [place] [address]  Internal MEDICINE  Office Visit Note  Patient Name: Kayla Jimenez  [id number]  [id number]  Date of Service: 04/21/2017  Chief Complaint  Patient presents with  . Otalgia    right ear  . Recurrent Sinusitis    Other  This is a recurrent problem. The current episode started in the past 7 days. The problem occurs constantly. The problem has been unchanged. Associated symptoms include congestion and a fever. Pertinent negatives include no chest pain or coughing. Nothing aggravates the symptoms. Treatments tried: oral decongestant.  The treatment provided mild relief.    Pt is here for routine follow up.    Current Medication: Outpatient Encounter Medications as of 04/21/2017  Medication Sig  . amphetamine-dextroamphetamine (ADDERALL) 30 MG tablet Take 15-30 mg by mouth daily as needed (Takes only when at work to help focus).  . cetirizine (ZYRTEC) 10 MG tablet Take 10 mg by mouth daily. Reported on 08/14/2015  . eszopiclone (LUNESTA) 2 MG TABS tablet Take 2 mg by mouth at bedtime. Take immediately before bedtime  . ibuprofen (ADVIL,MOTRIN) 600 MG tablet 1  po pc every 6 hours for 5 days then as needed for pain  . montelukast (SINGULAIR) 10 MG tablet Take 10 mg by mouth at bedtime.  . sertraline (ZOLOFT) 50 MG tablet Take 75 mg by mouth at bedtime.  . Vitamin D, Ergocalciferol, (DRISDOL) 50000 units CAPS capsule Take 1 capsule (50,000 Units total) by mouth every 7 (seven) days. Wednesdays  . bethanechol (URECHOLINE) 10 MG tablet Take 1 tablet (10 mg total) by mouth 3 (three) times daily as needed. Take 1 tablet q 1hr until effective response but not greater than 2hrs and no more than 50 mg per day.  . ferrous sulfate 325 (65 FE) MG tablet Take 325 mg by mouth daily with breakfast.  . HYDROmorphone (DILAUDID) 2 MG tablet 1  po every 6  hours as needed for post operative pain. (Patient not taking: Reported on 04/21/2017)    No facility-administered encounter medications on file as of 04/21/2017.     Surgical History: Past Surgical History:  Procedure Laterality Date  . ABDOMINAL HYSTERECTOMY    . ANTERIOR AND POSTERIOR REPAIR  03/16/2012   Procedure: ANTERIOR (CYSTOCELE) AND POSTERIOR REPAIR (RECTOCELE);  Surgeon: [name], MD;  Location: [facility];  Service: Gynecology;  Laterality: N/A;  anterior repair/cysto  . BLADDER SUSPENSION  03/16/2012   Procedure: TRANSVAGINAL TAPE (TVT) PROCEDURE;  Surgeon: [name], MD;  Location: [facility];  Service: Gynecology;  Laterality: N/A;  . BREAST REDUCTION SURGERY  6/04  . CHOLECYSTECTOMY  06/28/14  . CYSTOSCOPY  03/16/2012   Procedure: CYSTOSCOPY;  Surgeon: [name], MD;  Location: [facility];  Service: Gynecology;  Laterality: N/A;  . CYSTOSCOPY  03/16/2017   Procedure: CYSTOSCOPY;  Surgeon: Everett Graff, MD;  Location: [facility];  Service: Gynecology;;  . Murrell Redden & CURRETTAGE/HYSTROSCOPY WITH NOVASURE ABLATION  03/16/2012   Procedure: DILATATION & CURETTAGE/HYSTEROSCOPY WITH NOVASURE ABLATION;  Surgeon: [name], MD;  Location: [facility];  Service: Gynecology;  Laterality: N/A;  . TONSILLECTOMY AND ADENOIDECTOMY    . VAGINAL HYSTERECTOMY Bilateral 03/16/2017   Procedure: HYSTERECTOMY VAGINAL Bilateral Salpingectomy ;  Surgeon: Everett Graff, MD;  Location: [facility];  Service: Gynecology;  Laterality: Bilateral;    Medical History: Past Medical History:  Diagnosis Date  . Anxiety 08/29/10  . BV (bacterial vaginosis) 08/29/10  . Complication of anesthesia 2004  vomited  . Gross hematuria 07/08/2015  . H/O bladder infections   . H/O mumps   . H/O toxoplasmosis   . H/O varicella   . History of kidney stones   . Infections of kidney    several kidney stones, bladder infections  . Kidney disease 09/03/2011   Left ureteral calculus with partial obstruction, passed 06/09/2014. CT did show perinephric stranding.   . Menometrorrhagia 06/26/10   . Plantar fasciitis   . PMS (premenstrual syndrome) 07/10/11  . Post - coital bleeding 08/29/10  . Renal calculus, left   . Sinus infection   . UTI (lower urinary tract infection) 07/08/2015  . Yeast infection     Family History: Family History  Problem Relation Age of Onset  . Cancer Other        breast  . Cancer Mother        cervical  . Depression Mother   . Drug abuse Mother   . Anemia Mother        blood transfusion  . Migraines Mother     Social History   Socioeconomic History  . Marital status: Married    Spouse name: Not on file  . Number of children: Not on file  . Years of education: Not on file  . Highest education level: Not on file  Social Needs  . Financial resource strain: Not on file  . Food insecurity - worry: Not on file  . Food insecurity - inability: Not on file  . Transportation needs - medical: Not on file  . Transportation needs - non-medical: Not on file  Occupational History  . Not on file  Tobacco Use  . Smoking status: Never Smoker  . Smokeless tobacco: Never Used  Substance and Sexual Activity  . Alcohol use: No  . Drug use: No  . Sexual activity: Yes    Birth control/protection: Surgical    Comment: pt spouse had vasec.   Other Topics Concern  . Not on file  Social History Narrative  . Not on file      Review of Systems  Constitutional: Positive for fever.  HENT: Positive for congestion, ear pain and postnasal drip.        Ear itching, especially the right ear. She also has possible thrush infection.   Eyes: Negative.   Respiratory: Negative for cough, shortness of breath and wheezing.   Cardiovascular: Negative for chest pain and palpitations.  Gastrointestinal: Positive for constipation.  Endocrine: Negative.   Genitourinary:       Patient had hysterectomy 6 weeks ago. Has had 2 episodes of bacterial vaginosis. Both treated with antibiotics per GYN.  Allergic/Immunologic: Positive for environmental allergies.   Neurological: Negative.   Hematological: Negative.   Psychiatric/Behavioral: Positive for decreased concentration and sleep disturbance. The patient is nervous/anxious.     Today's Vitals   04/21/17 1555  BP: 110/80  Pulse: 89  Resp: 16  Temp: 98.6 F (37 C)  SpO2: 98%  Weight: 147 lb (66.7 kg)  Height: 5\' 3"  (1.6 m)    Physical Exam  Constitutional: She is oriented to person, place, and time. She appears well-developed and well-nourished. No distress.  HENT:  Head: Normocephalic and atraumatic.  Right Ear: There is tenderness. Tympanic membrane is erythematous and bulging.  Left Ear: Hearing, tympanic membrane, external ear and ear canal normal.  Ears:  Nose: Rhinorrhea present.  Mouth/Throat: Oropharynx is clear and moist. No oropharyngeal exudate.    Eyes: EOM are normal. Pupils are equal, round,  and reactive to light.  Neck: Normal range of motion. Neck supple. No JVD present. No tracheal deviation present. No thyromegaly present.  Cardiovascular: Normal rate, regular rhythm and normal heart sounds. Exam reveals no gallop and no friction rub.  No murmur heard. Pulmonary/Chest: Effort normal and breath sounds normal. No respiratory distress. She has no wheezes. She has no rales. She exhibits no tenderness.  Abdominal: Soft. Bowel sounds are normal. There is no tenderness.  Musculoskeletal: Normal range of motion.  Lymphadenopathy:    She has no cervical adenopathy.  Neurological: She is alert and oriented to person, place, and time. No cranial nerve deficit.  Skin: Skin is warm and dry. She is not diaphoretic.  Psychiatric: She has a normal mood and affect. Her behavior is normal. Judgment and thought content normal.    Assessment/Plan: 1. Acute otitis externa of right ear, unspecified type - neomycin-polymyxin-hydrocortisone (CORTISPORIN) OTIC solution; Place 4 drops into the right ear 3 (three) times daily.  Dispense: 10 mL; Refill: 0  2. Oral candidiasis -  nystatin (MYCOSTATIN) 100000 UNIT/ML suspension; Swish and swallow 79mls QID for 10 days  Dispense: 200 mL; Refill: 1  3. Attention and concentration deficit - amphetamine-dextroamphetamine (ADDERALL XR) 30 MG 24 hr capsule; Take 1 capsule (30 mg total) by mouth daily.  Dispense: 30 capsule; Refill: 0  4. Primary insomnia - eszopiclone (LUNESTA) 2 MG TABS tablet; Take 1 tablet (2 mg total) by mouth at bedtime. Take immediately before bedtime  Dispense: 30 tablet; Refill: 3   General Counseling: Letticia verbalizes understanding of the findings of todays visit and agrees with plan of treatment. I have discussed any further diagnostic evaluation that may be needed or ordered today. We also reviewed her medications today. she has been encouraged to call the office with any questions or concerns that should arise related to todays visit.   This patient was seen by Leretha Pol, FNP- C in Collaboration with Dr Lavera Guise as a part of collaborative care agreement    Time spent: 82 Minutes  Dr Lavera Guise Internal medicine

## 2017-04-27 ENCOUNTER — Ambulatory Visit: Payer: BC Managed Care – PPO | Admitting: Podiatry

## 2017-05-04 ENCOUNTER — Encounter: Payer: Self-pay | Admitting: Podiatry

## 2017-05-04 ENCOUNTER — Ambulatory Visit: Payer: BC Managed Care – PPO | Admitting: Podiatry

## 2017-05-04 DIAGNOSIS — M7751 Other enthesopathy of right foot: Secondary | ICD-10-CM

## 2017-05-04 NOTE — Progress Notes (Signed)
She presents today for follow-up of capsulitis second and third metatarsophalangeal joints.  There is still real sore and that it bothers me regularly.  She states that she is just bouncing back from surgery over the fall and winter and even though she was off of her feet for quite some time she still has soreness in the second knuckle joint.  Her white blood cells are staying chronically elevated at this point.  She will be following up with hematology in the near future.  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  Today pulses are strongly palpable.  No open lesions or wounds are noted.  The foot is not swollen.  She does have pain on range of motion of the second metatarsal phalangeal joint over the third.  There are palpable spurs dorsal medial aspect of the second metatarsal phalangeal joint.  This is consistent with osteoarthritic change and/or trauma.  Assessment: Due to the risk of blood dyscrasias and chronic injection atrophy I feel that this not a good idea to inject the area today.  I would like to wait until she is cleared from hematology before we do anything for her or to her.  I do feel that this can be necessary to have an MRI performed and I also feel that it will be necessary for [place] to do surgery to correct this joint.  She understands that and is amenable to it.  I will follow-up with her in the near future.  In the meantime we did get her a metatarsal silicone pad.

## 2017-05-06 NOTE — Progress Notes (Signed)
CONSULT NOTE  Patient Care Team: Lavera Guise, MD as PCP - General (Internal Medicine) Lavera Guise, MD (Internal Medicine) Bary Castilla Forest Gleason, MD (General Surgery)  CHIEF COMPLAINTS/PURPOSE OF CONSULTATION:  Leucocytosis/neutrophilia   HISTORY OF PRESENTING ILLNESS:   Kayla Jimenez 42 y.o. female is here because of a referral from [place] regarding leukocytosis.   The pt reports that she is doing well overall. She is a second [employer] and enjoys her work. She reports being aware of her elevated white counts for 2-3 years. She reports in the past couple years having gall bladder surgery, urinary infections 2-3 times per year, kidney stones, and sinus infections (which she reports having between August-February and treats with prednisone). She does have an indoor/outdoor cat, and has seasonal allergies. She reports taking Adderal prn for work.   Of note since the patient last visit, pt has had a hysterectomy completed on 03/16/17.   Most recent lab results (04/09/17) of CBC is as follows: all values are WNL except for WBC at 12.5k. Her Neutro Abs were elevated at 7.8 on 03/18/17.   On PMHx the patient reports taking Adderal prn at work, taking prednisone about once per year. Last UTI was more than 6 months ago. She had periods 2-3 times per month due to endometriosis  prior to having her hysterectomy in December 2018. She reports being on iron replacement in the past as well as Vitamin B12 replacement. She had a sleep study with no significant findings other than she is a "very sleepy person". She denies being on acid suppressants. She had a celiac panel from her GI Dr. Collene Mares recently, results pending.  On review of systems, pt reports low-grade fevers and mild night sweats, has been losing some hair for the last 2 years, and denies high grade fevers, chills, night sweats, weight loss, skin rashes, swollen and painful joints, pain along the spine, abdominal pains,  urinary symptoms, concerns of yeast infections or any infections.   MEDICAL HISTORY:  Past Medical History:  Diagnosis Date  . Anxiety 08/29/10  . BV (bacterial vaginosis) 08/29/10  . Complication of anesthesia 2004   vomited  . Gross hematuria 07/08/2015  . H/O bladder infections   . H/O mumps   . H/O toxoplasmosis   . H/O varicella   . History of kidney stones   . Infections of kidney    several kidney stones, bladder infections  . Kidney disease 09/03/2011   Left ureteral calculus with partial obstruction, passed 06/09/2014. CT did show perinephric stranding.   . Menometrorrhagia 06/26/10  . Plantar fasciitis   . PMS (premenstrual syndrome) 07/10/11  . Post - coital bleeding 08/29/10  . Renal calculus, left   . Sinus infection   . UTI (lower urinary tract infection) 07/08/2015  . Yeast infection     SURGICAL HISTORY: Past Surgical History:  Procedure Laterality Date  . ABDOMINAL HYSTERECTOMY    . ANTERIOR AND POSTERIOR REPAIR  03/16/2012   Procedure: ANTERIOR (CYSTOCELE) AND POSTERIOR REPAIR (RECTOCELE);  Surgeon: [name], MD;  Location: [facility];  Service: Gynecology;  Laterality: N/A;  anterior repair/cysto  . BLADDER SUSPENSION  03/16/2012   Procedure: TRANSVAGINAL TAPE (TVT) PROCEDURE;  Surgeon: [name], MD;  Location: [facility];  Service: Gynecology;  Laterality: N/A;  . BREAST REDUCTION SURGERY  6/04  . CHOLECYSTECTOMY  06/28/14  . CYSTOSCOPY  03/16/2012   Procedure: CYSTOSCOPY;  Surgeon: [name], MD;  Location: [facility];  Service: Gynecology;  Laterality: N/A;  . CYSTOSCOPY  03/16/2017   Procedure: CYSTOSCOPY;  Surgeon: Everett Graff, MD;  Location: [facility];  Service: Gynecology;;  . Murrell Redden & CURRETTAGE/HYSTROSCOPY WITH NOVASURE ABLATION  03/16/2012   Procedure: DILATATION & CURETTAGE/HYSTEROSCOPY WITH NOVASURE ABLATION;  Surgeon: [name], MD;  Location: [facility];  Service: Gynecology;  Laterality: N/A;  . TONSILLECTOMY AND ADENOIDECTOMY      . VAGINAL HYSTERECTOMY Bilateral 03/16/2017   Procedure: HYSTERECTOMY VAGINAL Bilateral Salpingectomy ;  Surgeon: Everett Graff, MD;  Location: [facility];  Service: Gynecology;  Laterality: Bilateral;    SOCIAL HISTORY: Social History   Socioeconomic History  . Marital status: Married    Spouse name: Not on file  . Number of children: Not on file  . Years of education: Not on file  . Highest education level: Not on file  Social Needs  . Financial resource strain: Not on file  . Food insecurity - worry: Not on file  . Food insecurity - inability: Not on file  . Transportation needs - medical: Not on file  . Transportation needs - non-medical: Not on file  Occupational History  . Not on file  Tobacco Use  . Smoking status: Never Smoker  . Smokeless tobacco: Never Used  Substance and Sexual Activity  . Alcohol use: No  . Drug use: No  . Sexual activity: Yes    Birth control/protection: Surgical    Comment: pt spouse had vasec.   Other Topics Concern  . Not on file  Social History Narrative  . Not on file    FAMILY HISTORY: Family History  Problem Relation Age of Onset  . Cancer Other        breast  . Cancer Mother        cervical  . Depression Mother   . Drug abuse Mother   . Anemia Mother        blood transfusion  . Migraines Mother     ALLERGIES:  is allergic to percocet [oxycodone-acetaminophen]; sulfa antibiotics; and sulfonamide derivatives.  MEDICATIONS:  Current Outpatient Medications  Medication Sig Dispense Refill  . amphetamine-dextroamphetamine (ADDERALL XR) 30 MG 24 hr capsule Take 1 capsule (30 mg total) by mouth daily. 30 capsule 0  . cetirizine (ZYRTEC) 10 MG tablet Take 10 mg by mouth daily. Reported on 08/14/2015    . eszopiclone (LUNESTA) 2 MG TABS tablet Take 1 tablet (2 mg total) by mouth at bedtime. Take immediately before bedtime 30 tablet 3  . ibuprofen (ADVIL,MOTRIN) 600 MG tablet 1  po pc every 6 hours for 5 days then as needed for pain  30 tablet 1  . montelukast (SINGULAIR) 10 MG tablet Take 10 mg by mouth at bedtime.    Marland Kitchen Plecanatide (TRULANCE PO) Take by mouth daily.    . Probiotic Product ([place]) Take by mouth daily.    . sertraline (ZOLOFT) 50 MG tablet Take 75 mg by mouth at bedtime.    . Vitamin D, Ergocalciferol, (DRISDOL) 50000 units CAPS capsule Take 1 capsule (50,000 Units total) by mouth every 7 (seven) days. Wednesdays 4 capsule 1   No current facility-administered medications for this visit.     REVIEW OF SYSTEMS:   Constitutional: Denies fevers, chills or abnormal night sweats Eyes: Denies blurriness of vision, double vision or watery eyes Ears, nose, mouth, throat, and face: Denies mucositis or sore throat Respiratory: Denies cough, dyspnea or wheezes Cardiovascular: Denies palpitation, chest discomfort or lower extremity swelling Gastrointestinal:  Denies nausea, heartburn or change in bowel habits Skin: Denies abnormal skin rashes Lymphatics: Denies new lymphadenopathy or easy bruising Neurological:Denies numbness, tingling or new weaknesses Behavioral/Psych: Mood is stable, no new changes  All other systems were reviewed with the patient and are negative.  PHYSICAL EXAMINATION:  Vitals:   05/07/17 1407  BP: 135/79  Pulse: 98  Resp: 18  Temp: 98.3 F (36.8 C)  SpO2: 97%   Filed Weights   05/07/17 1407  Weight: 147 lb 14.4 oz (67.1 kg)    GENERAL:alert, no distress and comfortable SKIN: skin color, texture, turgor are normal, no rashes or significant lesions EYES: normal, conjunctiva are pink and non-injected, sclera clear OROPHARYNX:no exudate, no erythema and lips, buccal mucosa, and tongue normal  NECK: supple, thyroid normal size, non-tender, without nodularity LYMPH:  no palpable lymphadenopathy in the cervical, axillary or inguinal LUNGS: clear to auscultation and percussion with normal breathing effort HEART: regular rate & rhythm and no murmurs and no lower  extremity edema ABDOMEN:abdomen soft, non-tender and normal bowel sounds, no splenomegaly, no hepatomegaly Musculoskeletal:no cyanosis of digits and no clubbing  PSYCH: alert & oriented x 3 with fluent speech NEURO: no focal motor/sensory deficits  LABORATORY DATA:   . CBC    Component Value Date/Time   WBC 10.5 (H) 05/07/2017 1507   WBC 12.5 (H) 03/18/2017 0714   RBC 3.99 05/07/2017 1507   RBC 3.99 05/07/2017 1507   HGB 12.0 03/18/2017 0714   HGB 13.3 05/10/2014 1035   HCT 38.4 05/07/2017 1507   HCT 38.5 05/10/2014 1035   PLT 307 05/07/2017 1507   PLT 267 05/10/2014 1035   MCV 96.2 05/07/2017 1507   MCV 93 05/10/2014 1035   MCH 32.6 05/07/2017 1507   MCHC 33.9 05/07/2017 1507   RDW 11.6 05/07/2017 1507   RDW 11.5 (L) 07/10/2014 1616   RDW 11.6 05/10/2014 1035   LYMPHSABS 2.4 05/07/2017 1507   LYMPHSABS 3.0 07/10/2014 1616   MONOABS 0.8 05/07/2017 1507   EOSABS 0.2 05/07/2017 1507   EOSABS 0.5 (H) 07/10/2014 1616   BASOSABS 0.0 05/07/2017 1507   BASOSABS 0.1 07/10/2014 1616   BASOSABS 2 05/10/2014 1035   . CBC Latest Ref Rng & Units 05/07/2017 03/18/2017 03/17/2017  WBC 3.9 - 10.3 K/uL 10.5(H) 12.5(H) 20.4(H)  Hemoglobin 12.0 - 15.0 g/dL - 12.0 13.0  Hematocrit 34.8 - 46.6 % 38.4 37.0 39.6  Platelets 145 - 400 K/uL 307 273 300    . CMP Latest Ref Rng & Units 05/07/2017 07/06/2015 07/10/2014  Glucose 70 - 140 mg/dL 98 - 96  BUN 7 - 26 mg/dL 15 10 11   Creatinine 0.60 - 1.10 mg/dL 0.81 0.65 0.71  Sodium 136 - 145 mmol/L 141 - 139  Potassium 3.5 - 5.1 mmol/L 3.9 - 4.6  Chloride 98 - 109 mmol/L 107 - 99  CO2 22 - 29 mmol/L 27 - 23  Calcium 8.4 - 10.4 mg/dL 8.9 - 9.5  Total Protein 6.4 - 8.3 g/dL 6.9 - 6.6  Total Bilirubin 0.2 - 1.2 mg/dL 0.2 - <0.2  Alkaline Phos 40 - 150 U/L 75 - 70  AST 5 - 34 U/L 9 - 16  ALT 0 - 55 U/L 9 - 24   . Lab Results  Component Value Date   LDH 121 (L) 05/07/2017   Sed rate 14  BCR-ABL FISH neg  RADIOGRAPHIC STUDIES: I have  personally reviewed the radiological images as listed and agreed with the findings in the report. No results  found.  ASSESSMENT & PLAN:  42 y.o. is a female with   1. Leukocytosis/Neutrophilia Likely reactive - due to infection/steroids/Adderall.  WBC count are improving from 20.4k--> 12.5--->10.5k BCR-ABL FISH neg Plan -Discussed pt labwork today and the fluctuating trend of her WBC count and possible reactive etiologies for her neutrophilia which currently appears to be resolving. -PBS- no evidence of increased blasts or findings suggestive of CML also BCR-ABL was done and wasneg -no overt evidence of acute bacterial infection at this time. -WBC count is likely reactive to seasonal allergies, recent surgeries, adderal medication. -Does not appear to be a myeloid poliferative neoplasm based on most recent labs.   Plan:  Labs today RTC with Dr Irene Limbo  As needed   All questions were answered. The patient knows to call the clinic with any problems, questions or concerns. I spent 30 minutes counseling the patient face to face. The total time spent in the appointment was 35 minutes and more than 50% was on counseling.    This document serves as a record of services personally performed by Sullivan Lone, MD. It was created on his behalf by Baldwin Jamaica, a trained medical scribe. The creation of this record is based on the scribe's personal observations and the provider's statements to them.   .I have reviewed the above documentation for accuracy and completeness, and I agree with the above. Brunetta Genera MD MS

## 2017-05-07 ENCOUNTER — Telehealth: Payer: Self-pay | Admitting: Hematology

## 2017-05-07 ENCOUNTER — Inpatient Hospital Stay: Payer: BC Managed Care – PPO | Attending: Hematology | Admitting: Hematology

## 2017-05-07 ENCOUNTER — Inpatient Hospital Stay: Payer: BC Managed Care – PPO

## 2017-05-07 ENCOUNTER — Encounter: Payer: Self-pay | Admitting: Hematology

## 2017-05-07 ENCOUNTER — Other Ambulatory Visit: Payer: BC Managed Care – PPO

## 2017-05-07 VITALS — BP 135/79 | HR 98 | Temp 98.3°F | Resp 18 | Ht 63.0 in | Wt 147.9 lb

## 2017-05-07 DIAGNOSIS — F419 Anxiety disorder, unspecified: Secondary | ICD-10-CM | POA: Diagnosis not present

## 2017-05-07 DIAGNOSIS — Z79899 Other long term (current) drug therapy: Secondary | ICD-10-CM | POA: Diagnosis not present

## 2017-05-07 DIAGNOSIS — Z882 Allergy status to sulfonamides status: Secondary | ICD-10-CM | POA: Insufficient documentation

## 2017-05-07 DIAGNOSIS — Z90710 Acquired absence of both cervix and uterus: Secondary | ICD-10-CM | POA: Insufficient documentation

## 2017-05-07 DIAGNOSIS — J302 Other seasonal allergic rhinitis: Secondary | ICD-10-CM | POA: Diagnosis not present

## 2017-05-07 DIAGNOSIS — D72829 Elevated white blood cell count, unspecified: Secondary | ICD-10-CM

## 2017-05-07 DIAGNOSIS — Z87440 Personal history of urinary (tract) infections: Secondary | ICD-10-CM | POA: Diagnosis not present

## 2017-05-07 DIAGNOSIS — R61 Generalized hyperhidrosis: Secondary | ICD-10-CM | POA: Diagnosis not present

## 2017-05-07 DIAGNOSIS — Z885 Allergy status to narcotic agent status: Secondary | ICD-10-CM

## 2017-05-07 DIAGNOSIS — Z87442 Personal history of urinary calculi: Secondary | ICD-10-CM | POA: Insufficient documentation

## 2017-05-07 LAB — CBC WITH DIFFERENTIAL (CANCER CENTER ONLY)
Basophils Absolute: 0 10*3/uL (ref 0.0–0.1)
Basophils Relative: 0 %
EOS ABS: 0.2 10*3/uL (ref 0.0–0.5)
Eosinophils Relative: 2 %
HEMATOCRIT: 38.4 % (ref 34.8–46.6)
HEMOGLOBIN: 13 g/dL (ref 11.6–15.9)
LYMPHS ABS: 2.4 10*3/uL (ref 0.9–3.3)
LYMPHS PCT: 23 %
MCH: 32.6 pg (ref 25.1–34.0)
MCHC: 33.9 g/dL (ref 31.5–36.0)
MCV: 96.2 fL (ref 79.5–101.0)
MONOS PCT: 7 %
Monocytes Absolute: 0.8 10*3/uL (ref 0.1–0.9)
NEUTROS PCT: 68 %
Neutro Abs: 7.1 10*3/uL — ABNORMAL HIGH (ref 1.5–6.5)
Platelet Count: 307 10*3/uL (ref 145–400)
RBC: 3.99 MIL/uL (ref 3.70–5.45)
RDW: 11.6 % (ref 11.2–14.5)
WBC: 10.5 10*3/uL — AB (ref 3.9–10.3)

## 2017-05-07 LAB — RETICULOCYTES
RBC.: 3.99 MIL/uL (ref 3.70–5.45)
RETIC CT PCT: 1.2 % (ref 0.7–2.1)
Retic Count, Absolute: 47.9 10*3/uL (ref 33.7–90.7)

## 2017-05-07 LAB — CMP (CANCER CENTER ONLY)
ALT: 9 U/L (ref 0–55)
AST: 9 U/L (ref 5–34)
Albumin: 3.6 g/dL (ref 3.5–5.0)
Alkaline Phosphatase: 75 U/L (ref 40–150)
Anion gap: 7 (ref 3–11)
BUN: 15 mg/dL (ref 7–26)
CHLORIDE: 107 mmol/L (ref 98–109)
CO2: 27 mmol/L (ref 22–29)
CREATININE: 0.81 mg/dL (ref 0.60–1.10)
Calcium: 8.9 mg/dL (ref 8.4–10.4)
GFR, Estimated: >60 mL/min (ref >60)
Glucose, Bld: 98 mg/dL (ref 70–140)
Potassium: 3.9 mmol/L (ref 3.5–5.1)
SODIUM: 141 mmol/L (ref 136–145)
Total Bilirubin: 0.2 mg/dL (ref 0.2–1.2)
Total Protein: 6.9 g/dL (ref 6.4–8.3)

## 2017-05-07 LAB — SEDIMENTATION RATE: SED RATE: 14 mm/h (ref 0–22)

## 2017-05-07 LAB — LACTATE DEHYDROGENASE: LDH: 121 U/L — ABNORMAL LOW (ref 125–245)

## 2017-05-07 NOTE — Telephone Encounter (Signed)
Gave avs sent to lab

## 2017-05-20 LAB — BCR ABL1 FISH (GENPATH)

## 2017-06-08 ENCOUNTER — Telehealth: Payer: Self-pay

## 2017-06-08 NOTE — Telephone Encounter (Signed)
CALLED PT BACK FOR SICK APPT. LMOM ASKED HER TO CALL AND SCHD/BR

## 2017-06-22 ENCOUNTER — Other Ambulatory Visit: Payer: Self-pay

## 2017-06-22 MED ORDER — SERTRALINE HCL 50 MG PO TABS: 75.0000 mg | ORAL_TABLET | Freq: Every day | ORAL | 3 refills | Status: DC

## 2017-06-24 ENCOUNTER — Other Ambulatory Visit: Payer: Self-pay | Admitting: Nurse Practitioner

## 2017-06-24 DIAGNOSIS — F321 Major depressive disorder, single episode, moderate: Secondary | ICD-10-CM

## 2017-06-24 DIAGNOSIS — J0141 Acute recurrent pansinusitis: Secondary | ICD-10-CM

## 2017-06-24 DIAGNOSIS — B379 Candidiasis, unspecified: Secondary | ICD-10-CM

## 2017-06-24 DIAGNOSIS — F5101 Primary insomnia: Secondary | ICD-10-CM

## 2017-06-24 MED ORDER — AMOXICILLIN-POT CLAVULANATE 875-125 MG PO TABS: 1.0000 | ORAL_TABLET | Freq: Two times a day (BID) | ORAL | 0 refills | Status: DC

## 2017-06-24 MED ORDER — FLUCONAZOLE 150 MG PO TABS: ORAL_TABLET | ORAL | 0 refills | Status: DC

## 2017-06-24 MED ORDER — ESZOPICLONE 2 MG PO TABS: 2.0000 mg | ORAL_TABLET | Freq: Every day | ORAL | 3 refills | Status: DC

## 2017-06-24 MED ORDER — SERTRALINE HCL 50 MG PO TABS: 75.0000 mg | ORAL_TABLET | Freq: Every day | ORAL | 3 refills | Status: DC

## 2017-06-24 NOTE — Progress Notes (Signed)
Augmentin 875mg  bid for 10 days for sinusitis. Added diflucan in case yeast infectin develops. Refilled lunesta and sertraline until next visit. All sent to [facility].

## 2017-06-26 ENCOUNTER — Other Ambulatory Visit: Payer: Self-pay

## 2017-06-26 MED ORDER — VITAMIN D (ERGOCALCIFEROL) 1.25 MG (50000 UNIT) PO CAPS: 50000.0000 [IU] | ORAL_CAPSULE | ORAL | 1 refills | Status: DC

## 2017-07-20 ENCOUNTER — Other Ambulatory Visit: Payer: Self-pay

## 2017-07-20 MED ORDER — MONTELUKAST SODIUM 10 MG PO TABS: 10.0000 mg | ORAL_TABLET | Freq: Every day | ORAL | 5 refills | Status: DC

## 2017-07-27 ENCOUNTER — Encounter: Payer: Self-pay | Admitting: Nurse Practitioner

## 2017-07-27 ENCOUNTER — Ambulatory Visit: Payer: BC Managed Care – PPO | Admitting: Nurse Practitioner

## 2017-07-27 VITALS — BP 140/93 | HR 77 | Resp 16 | Ht 63.0 in | Wt 154.0 lb

## 2017-07-27 DIAGNOSIS — R41840 Attention and concentration deficit: Secondary | ICD-10-CM

## 2017-07-27 DIAGNOSIS — F5101 Primary insomnia: Secondary | ICD-10-CM

## 2017-07-27 DIAGNOSIS — F321 Major depressive disorder, single episode, moderate: Secondary | ICD-10-CM | POA: Diagnosis not present

## 2017-07-27 DIAGNOSIS — J301 Allergic rhinitis due to pollen: Secondary | ICD-10-CM | POA: Diagnosis not present

## 2017-07-27 MED ORDER — AMPHETAMINE-DEXTROAMPHET ER 30 MG PO CP24: 30.0000 mg | ORAL_CAPSULE | Freq: Every day | ORAL | 0 refills | Status: DC

## 2017-07-27 NOTE — Progress Notes (Signed)
[facility] [place], [place] [address]  Internal MEDICINE  Office Visit Note  Patient Name: Kayla Jimenez  [id number]  [id number]  Date of Service: 08/12/2017  Chief Complaint  Patient presents with  . Depression  . Medication Refill    The patient is here for routine follow up visit. Today, she would like to have her Adderall XR 30mg  refilled. Switched IR adderall to long acting alternative at her last visit. She states that this has been a good change. Does not feel the medication hit her, but does not feel a crash at the end of the day. She does not report any negative side effects.       Current Medication: Outpatient Encounter Medications as of 07/27/2017  Medication Sig  . amphetamine-dextroamphetamine (ADDERALL XR) 30 MG 24 hr capsule Take 1 capsule (30 mg total) by mouth daily.  . cetirizine (ZYRTEC) 10 MG tablet Take 10 mg by mouth daily. Reported on 08/14/2015  . eszopiclone (LUNESTA) 2 MG TABS tablet Take 1 tablet (2 mg total) by mouth at bedtime.  . fluconazole (DIFLUCAN) 150 MG tablet Take 1 tablet po once. May repeat dose in 3 days as needed for persistent symptoms.  Marland Kitchen ibuprofen (ADVIL,MOTRIN) 600 MG tablet 1  po pc every 6 hours for 5 days then as needed for pain  . montelukast (SINGULAIR) 10 MG tablet Take 1 tablet (10 mg total) by mouth at bedtime.  . Probiotic Product ([place]) Take by mouth daily.  . sertraline (ZOLOFT) 50 MG tablet Take 1.5 tablets (75 mg total) by mouth at bedtime.  . Vitamin D, Ergocalciferol, (DRISDOL) 50000 units CAPS capsule Take 1 capsule (50,000 Units total) by mouth every 7 (seven) days. Wednesdays  . [DISCONTINUED] amphetamine-dextroamphetamine (ADDERALL XR) 30 MG 24 hr capsule Take 1 capsule (30 mg total) by mouth daily.  . [DISCONTINUED] amphetamine-dextroamphetamine (ADDERALL XR) 30 MG 24 hr capsule Take 1 capsule (30 mg total) by mouth daily.  . [DISCONTINUED]  amphetamine-dextroamphetamine (ADDERALL XR) 30 MG 24 hr capsule Take 1 capsule (30 mg total) by mouth daily.  . [DISCONTINUED] amoxicillin-clavulanate (AUGMENTIN) 875-125 MG tablet Take 1 tablet by mouth 2 (two) times daily. (Patient not taking: Reported on 07/27/2017)  . [DISCONTINUED] Plecanatide (TRULANCE PO) Take by mouth daily.   No facility-administered encounter medications on file as of 07/27/2017.     Surgical History: Past Surgical History:  Procedure Laterality Date  . ABDOMINAL HYSTERECTOMY    . ANTERIOR AND POSTERIOR REPAIR  03/16/2012   Procedure: ANTERIOR (CYSTOCELE) AND POSTERIOR REPAIR (RECTOCELE);  Surgeon: [name], MD;  Location: [facility];  Service: Gynecology;  Laterality: N/A;  anterior repair/cysto  . BLADDER SUSPENSION  03/16/2012   Procedure: TRANSVAGINAL TAPE (TVT) PROCEDURE;  Surgeon: [name], MD;  Location: [facility];  Service: Gynecology;  Laterality: N/A;  . BREAST REDUCTION SURGERY  6/04  . CHOLECYSTECTOMY  06/28/14  . CYSTOSCOPY  03/16/2012   Procedure: CYSTOSCOPY;  Surgeon: [name], MD;  Location: [facility];  Service: Gynecology;  Laterality: N/A;  . CYSTOSCOPY  03/16/2017   Procedure: CYSTOSCOPY;  Surgeon: Everett Graff, MD;  Location: [facility];  Service: Gynecology;;  . Murrell Redden & CURRETTAGE/HYSTROSCOPY WITH NOVASURE ABLATION  03/16/2012   Procedure: DILATATION & CURETTAGE/HYSTEROSCOPY WITH NOVASURE ABLATION;  Surgeon: [name], MD;  Location: [facility];  Service: Gynecology;  Laterality: N/A;  . TONSILLECTOMY AND ADENOIDECTOMY    . VAGINAL HYSTERECTOMY Bilateral 03/16/2017   Procedure: HYSTERECTOMY VAGINAL  Bilateral Salpingectomy ;  Surgeon: Everett Graff, MD;  Location: [facility];  Service: Gynecology;  Laterality: Bilateral;    Medical History: Past Medical History:  Diagnosis Date  . Anxiety 08/29/10  . BV (bacterial vaginosis) 08/29/10  . Complication of anesthesia 2004   vomited  . Gross hematuria 07/08/2015  . H/O bladder  infections   . H/O mumps   . H/O toxoplasmosis   . H/O varicella   . History of kidney stones   . Infections of kidney    several kidney stones, bladder infections  . Kidney disease 09/03/2011   Left ureteral calculus with partial obstruction, passed 06/09/2014. CT did show perinephric stranding.   . Menometrorrhagia 06/26/10  . Plantar fasciitis   . PMS (premenstrual syndrome) 07/10/11  . Post - coital bleeding 08/29/10  . Renal calculus, left   . Sinus infection   . UTI (lower urinary tract infection) 07/08/2015  . Yeast infection     Family History: Family History  Problem Relation Age of Onset  . Cancer Other        breast  . Cancer Mother        cervical  . Depression Mother   . Drug abuse Mother   . Anemia Mother        blood transfusion  . Migraines Mother     Social History   Socioeconomic History  . Marital status: Married    Spouse name: Not on file  . Number of children: Not on file  . Years of education: Not on file  . Highest education level: Not on file  Occupational History  . Not on file  Social Needs  . Financial resource strain: Not on file  . Food insecurity:    Worry: Not on file    Inability: Not on file  . Transportation needs:    Medical: Not on file    Non-medical: Not on file  Tobacco Use  . Smoking status: Never Smoker  . Smokeless tobacco: Never Used  Substance and Sexual Activity  . Alcohol use: No  . Drug use: No  . Sexual activity: Yes    Birth control/protection: Surgical    Comment: pt spouse had vasec.   Lifestyle  . Physical activity:    Days per week: Not on file    Minutes per session: Not on file  . Stress: Not on file  Relationships  . Social connections:    Talks on phone: Not on file    Gets together: Not on file    Attends religious service: Not on file    Active member of club or organization: Not on file    Attends meetings of clubs or organizations: Not on file    Relationship status: Not on file  . Intimate  partner violence:    Fear of current or ex partner: Not on file    Emotionally abused: Not on file    Physically abused: Not on file    Forced sexual activity: Not on file  Other Topics Concern  . Not on file  Social History Narrative  . Not on file      Review of Systems  Constitutional: Negative for activity change, fatigue and fever.  HENT: Negative for congestion, ear pain, postnasal drip, rhinorrhea and voice change.   Eyes: Negative.   Respiratory: Negative for apnea, cough, shortness of breath and wheezing.   Cardiovascular: Negative for chest pain and palpitations.  Gastrointestinal: Positive for constipation. Negative for nausea.  Endocrine: Negative  for cold intolerance, heat intolerance, polydipsia, polyphagia and polyuria.  Genitourinary: Negative.   Musculoskeletal: Negative for arthralgias, back pain and myalgias.  Allergic/Immunologic: Positive for environmental allergies.  Neurological: Negative for weakness, numbness and headaches.  Hematological: Negative.   Psychiatric/Behavioral: Positive for decreased concentration. Negative for sleep disturbance. The patient is nervous/anxious.     Today's Vitals   07/27/17 1101  BP: (!) 140/93  Pulse: 77  Resp: 16  SpO2: 99%  Weight: 154 lb (69.9 kg)  Height: 5\' 3"  (1.6 m)    Physical Exam  Constitutional: She is oriented to person, place, and time. She appears well-developed and well-nourished. No distress.  HENT:  Head: Normocephalic and atraumatic.  Mouth/Throat: Oropharynx is clear and moist. No oropharyngeal exudate.  Eyes: Pupils are equal, round, and reactive to light. EOM are normal.  Neck: Normal range of motion. Neck supple. No JVD present. No tracheal deviation present. No thyromegaly present.  Cardiovascular: Normal rate, regular rhythm and normal heart sounds. Exam reveals no gallop and no friction rub.  No murmur heard. Pulmonary/Chest: Effort normal and breath sounds normal. No respiratory  distress. She has no wheezes. She has no rales. She exhibits no tenderness.  Abdominal: Soft. Bowel sounds are normal. There is no tenderness.  Musculoskeletal: Normal range of motion.  Lymphadenopathy:    She has no cervical adenopathy.  Neurological: She is alert and oriented to person, place, and time. No cranial nerve deficit.  Skin: Skin is warm and dry. She is not diaphoretic.  Psychiatric: She has a normal mood and affect. Her behavior is normal. Judgment and thought content normal.  Nursing note and vitals reviewed.   Assessment/Plan: 1. Allergic rhinitis due to pollen, unspecified seasonality Patient should continue allergy medication and nasal spray as prescribed.   2. Attention and concentration deficit Doing well. Continue adderall XR 30mg  daily. Three 30 day prescriptions sent to her pharmacy. Dates are 07/24/2017, 08/21/2017, and 09/19/2017.  - amphetamine-dextroamphetamine (ADDERALL XR) 30 MG 24 hr capsule; Take 1 capsule (30 mg total) by mouth daily.  Dispense: 30 capsule; Refill: 0  3. Moderate major depression ([place]) Well controlled. Continue to take sertraline as prescribed.   4. Primary insomnia Continue to take lunesta as needed and as prescribed.   General Counseling: Kayla Jimenez verbalizes understanding of the findings of todays visit and agrees with plan of treatment. I have discussed any further diagnostic evaluation that may be needed or ordered today. We also reviewed her medications today. she has been encouraged to call the office with any questions or concerns that should arise related to todays visit.   This patient was seen by Leretha Pol, FNP- C in Collaboration with Dr Lavera Guise as a part of collaborative care agreement  Meds ordered this encounter  Medications  . DISCONTD: amphetamine-dextroamphetamine (ADDERALL XR) 30 MG 24 hr capsule    Sig: Take 1 capsule (30 mg total) by mouth daily.    Dispense:  30 capsule    Refill:  0    Order Specific  Question:   Supervising Provider    Answer:   Lavera Guise [[id number]]  . DISCONTD: amphetamine-dextroamphetamine (ADDERALL XR) 30 MG 24 hr capsule    Sig: Take 1 capsule (30 mg total) by mouth daily.    Dispense:  30 capsule    Refill:  0    Fill after 08/24/2017    Order Specific Question:   Supervising Provider    Answer:   Lavera Guise [[id number]]  . amphetamine-dextroamphetamine (ADDERALL  XR) 30 MG 24 hr capsule    Sig: Take 1 capsule (30 mg total) by mouth daily.    Dispense:  30 capsule    Refill:  0    Fill after 09/22/2017    Order Specific Question:   Supervising Provider    Answer:   Lavera Guise [[id number]]    Time spent: 52 Minutes        Dr Lavera Guise Internal medicine

## 2017-08-05 ENCOUNTER — Ambulatory Visit: Payer: BC Managed Care – PPO | Admitting: Podiatry

## 2017-08-10 ENCOUNTER — Encounter: Payer: Self-pay | Admitting: Podiatry

## 2017-08-10 ENCOUNTER — Ambulatory Visit: Payer: BC Managed Care – PPO | Admitting: Podiatry

## 2017-08-10 DIAGNOSIS — M2011 Hallux valgus (acquired), right foot: Secondary | ICD-10-CM

## 2017-08-10 DIAGNOSIS — M216X1 Other acquired deformities of right foot: Secondary | ICD-10-CM | POA: Diagnosis not present

## 2017-08-10 NOTE — Patient Instructions (Signed)
Pre-Operative Instructions  Congratulations, you have decided to take an important step towards improving your quality of life.  You can be assured that the doctors and staff at [facility] will be with you every step of the way.  Here are some important things you should know:  1. Plan to be at the surgery center/hospital at least 1 (one) hour prior to your scheduled time, unless otherwise directed by the surgical center/hospital staff.  You must have a responsible adult accompany you, remain during the surgery and drive you home.  Make sure you have directions to the surgical center/hospital to ensure you arrive on time. 2. If you are having surgery at [facility] or [facility] hospitals, you will need a copy of your medical history and physical form from your family physician within one month prior to the date of surgery. We will give you a form for your primary physician to complete.  3. We make every effort to accommodate the date you request for surgery.  However, there are times where surgery dates or times have to be moved.  We will contact you as soon as possible if a change in schedule is required.   4. No aspirin/ibuprofen for one week before surgery.  If you are on aspirin, any non-steroidal anti-inflammatory medications (Mobic, Aleve, Ibuprofen) should not be taken seven (7) days prior to your surgery.  You make take Tylenol for pain prior to surgery.  5. Medications - If you are taking daily heart and blood pressure medications, seizure, reflux, allergy, asthma, anxiety, pain or diabetes medications, make sure you notify the surgery center/hospital before the day of surgery so they can tell you which medications you should take or avoid the day of surgery. 6. No food or drink after midnight the night before surgery unless directed otherwise by surgical center/hospital staff. 7. No alcoholic beverages 24-hours prior to surgery.  No smoking 24-hours prior or 24-hours after  surgery. 8. Wear loose pants or shorts. They should be loose enough to fit over bandages, boots, and casts. 9. Don't wear slip-on shoes. Sneakers are preferred. 10. Bring your boot with you to the surgery center/hospital.  Also bring crutches or a walker if your physician has prescribed it for you.  If you do not have this equipment, it will be provided for you after surgery. 11. If you have not been contacted by the surgery center/hospital by the day before your surgery, call to confirm the date and time of your surgery. 12. Leave-time from work may vary depending on the type of surgery you have.  Appropriate arrangements should be made prior to surgery with your employer. 13. Prescriptions will be provided immediately following surgery by your doctor.  Fill these as soon as possible after surgery and take the medication as directed. Pain medications will not be refilled on weekends and must be approved by the doctor. 14. Remove nail polish on the operative foot and avoid getting pedicures prior to surgery. 15. Wash the night before surgery.  The night before surgery wash the foot and leg well with water and the antibacterial soap provided. Be sure to pay special attention to beneath the toenails and in between the toes.  Wash for at least three (3) minutes. Rinse thoroughly with water and dry well with a towel.  Perform this wash unless told not to do so by your physician.  Enclosed: 1 Ice pack (please put in freezer the night before surgery)   1 Hibiclens skin cleaner     Pre-op instructions  If you have any questions regarding the instructions, please do not hesitate to call our office.  [place]: [address], [place], [place] [address] -- [phone]  [place]: [address] [place], [place] [address] -- [phone]  [place]: [address]  [place], [place] [address] -- [phone]  [place]: [address], [address], [place], [place] [address] -- [phone]  Website:  [email or web address] 

## 2017-08-11 NOTE — Progress Notes (Signed)
She presents today states that I am ready for surgery I cannot stand this any longer is affecting my ability to work it is affecting my ability to maintain a healthy lifestyle.  States that she is just gotten over some major surgeries she needs to be able to get back to her healthy routine.  She denies any changes in her past medical history medications allergy surgeries and social history other than those that we have previously discussed.  Objective: Vital signs are stable she is alert and oriented x3.  Pulses are palpable neurologic sensorium is intact deep tendon reflexes are intact muscle strength is normal symmetrical bilateral.  Orthopedic evaluation demonstrates all joints distal ankle full range of motion without crepitation she does have pain on palpation of the second metatarsal phalangeal joint of the right foot.  Mildly elevated second digit of the right foot more than likely associated with a mild bunion deformity and an increase in the first interphalangeal joint angle.  Also reviewed radiographs which do demonstrate plantar flexed elongated second metatarsal.  Assessment: Pain in limb secondary to capsulitis of the second metatarsal phalangeal joint of the right foot hallux interphalangeal right foot mild bunion deformity right foot.  Plan: Discussed etiology pathology conservative or surgical therapies.  At this point I recommended surgical intervention consisting of a second metatarsal osteotomy with double screw fixation McBride bunion repaired just to release the lateral capsule and then an Aiken osteotomy to straighten the hallux so that it does not try to elevate the second toe again.  She understands this is amenable to it and would like to proceed.  We did discuss the possible postop complications which may include but not limited to postop pain bleeding swelling infection recurrence need further surgery overcorrection under correction.  Dispensed a Cam walker dispensed both oral and  written home-going instructions for the care of her foot as well as preop care surgery center information and anesthesia information.

## 2017-08-12 DIAGNOSIS — F5101 Primary insomnia: Secondary | ICD-10-CM | POA: Insufficient documentation

## 2017-08-12 DIAGNOSIS — J301 Allergic rhinitis due to pollen: Secondary | ICD-10-CM | POA: Insufficient documentation

## 2017-08-12 DIAGNOSIS — F321 Major depressive disorder, single episode, moderate: Secondary | ICD-10-CM | POA: Insufficient documentation

## 2017-08-12 DIAGNOSIS — R41840 Attention and concentration deficit: Secondary | ICD-10-CM | POA: Insufficient documentation

## 2017-08-12 DIAGNOSIS — G4709 Other insomnia: Secondary | ICD-10-CM | POA: Insufficient documentation

## 2017-08-20 ENCOUNTER — Other Ambulatory Visit: Payer: Self-pay | Admitting: Internal Medicine

## 2017-08-20 MED ORDER — VITAMIN D (ERGOCALCIFEROL) 1.25 MG (50000 UNIT) PO CAPS: 50000.0000 [IU] | ORAL_CAPSULE | ORAL | 1 refills | Status: DC

## 2017-09-01 ENCOUNTER — Telehealth: Payer: Self-pay | Admitting: *Deleted

## 2017-09-01 NOTE — Telephone Encounter (Signed)
"  I was calling to schedule foot surgery with Dr. Milinda Pointer.  I can be reached at [phone].  Thank you."

## 2017-09-02 NOTE — Telephone Encounter (Signed)
"  I trying to schedule my surgery with Dr. Milinda Pointer.  I called yesterday and nobody ever called me back."  Do you have a date in mind that you would like to schedule it?  "What do you have in July?  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  "Put me down for July 5.  I will check with my husband and see if he can get off work that day.  If he can't I'll call and change it to one of the other days.  What time will I need to get there?"  I can't give you a time because they may have cancellations or have patients that are Diabetic or children that have to go first.  "Okay, thank you so much."

## 2017-09-03 ENCOUNTER — Telehealth: Payer: Self-pay | Admitting: *Deleted

## 2017-09-03 NOTE — Telephone Encounter (Signed)
"  I spoke to you yesterday and scheduled my surgery for July 5.  You had offered me July 19.  I'd like to switch it to that date."  I'll get it rescheduled from 10/09/2017 to 10/23/2017.  "Thank you so much."

## 2017-09-04 NOTE — Telephone Encounter (Signed)
I called Kayla Jimenez at [facility] and rescheduled the surgery from 10/09/2017 to 10/23/2017.

## 2017-10-20 ENCOUNTER — Encounter: Payer: Self-pay | Admitting: Nurse Practitioner

## 2017-10-20 ENCOUNTER — Ambulatory Visit: Payer: BC Managed Care – PPO | Admitting: Nurse Practitioner

## 2017-10-20 VITALS — BP 138/92 | HR 79 | Resp 16 | Ht 63.0 in | Wt 160.2 lb

## 2017-10-20 DIAGNOSIS — N390 Urinary tract infection, site not specified: Secondary | ICD-10-CM | POA: Diagnosis not present

## 2017-10-20 DIAGNOSIS — J301 Allergic rhinitis due to pollen: Secondary | ICD-10-CM

## 2017-10-20 DIAGNOSIS — F321 Major depressive disorder, single episode, moderate: Secondary | ICD-10-CM

## 2017-10-20 DIAGNOSIS — F5101 Primary insomnia: Secondary | ICD-10-CM

## 2017-10-20 DIAGNOSIS — R300 Dysuria: Secondary | ICD-10-CM | POA: Diagnosis not present

## 2017-10-20 LAB — POCT URINALYSIS DIPSTICK
BILIRUBIN UA: NEGATIVE
Blood, UA: NEGATIVE
GLUCOSE UA: NEGATIVE
Ketones, UA: NEGATIVE
Nitrite, UA: NEGATIVE
Protein, UA: NEGATIVE
Spec Grav, UA: >=1.03 — AB (ref 1.010–1.025)
Urobilinogen, UA: 0.2 E.U./dL
pH, UA: 5 (ref 5.0–8.0)

## 2017-10-20 IMAGING — CT CT ABD-PEL WO/W CM
1 of 3 series · 12 of 32 positions shown, 18 images · IV contrast (iopamidol)
Comparison: CT the abdomen and pelvis 05/23/2014.

CLINICAL DATA: 40-year-old female with history of urinary
frequency, dysuria and nocturia with 2 recent episodes of gross
hematuria. Recently diagnosed with urinary tract infection and
treated with antibiotics. No relief from antibiotics. Right-sided
flank pain and low back pain. Prior history of uterine ablations.

EXAM:
CT ABDOMEN AND PELVIS WITHOUT AND WITH CONTRAST
TECHNIQUE: Multidetector CT imaging of the abdomen and pelvis was performed
following the standard protocol before and following the bolus
administration of intravenous contrast.
CONTRAST:  150mL VR0HYS-2RR IOPAMIDOL (VR0HYS-2RR) INJECTION 61%

[Series 8: axial delay · axial · delayed · 0.68mm/px · z∈[-1058,-623]mm · 12 of 103 slices shown, 18 images]
[im 8/103  soft-tissue]
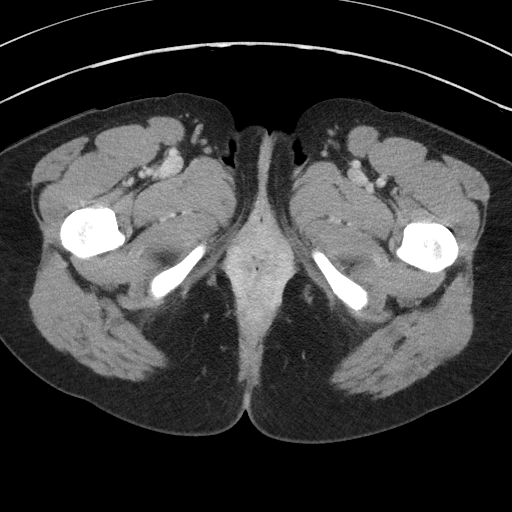
[im 8/103  bone]
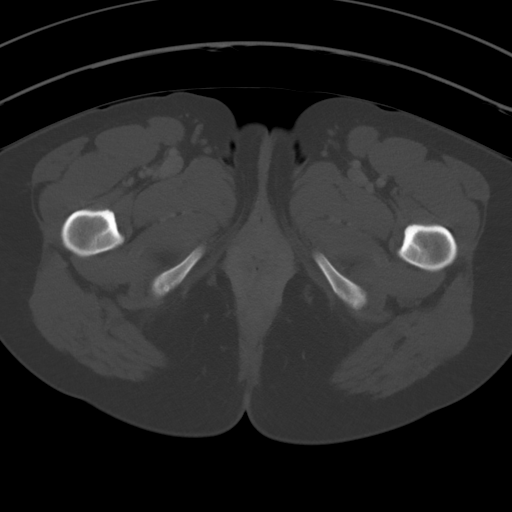
[im 16/103  soft-tissue]
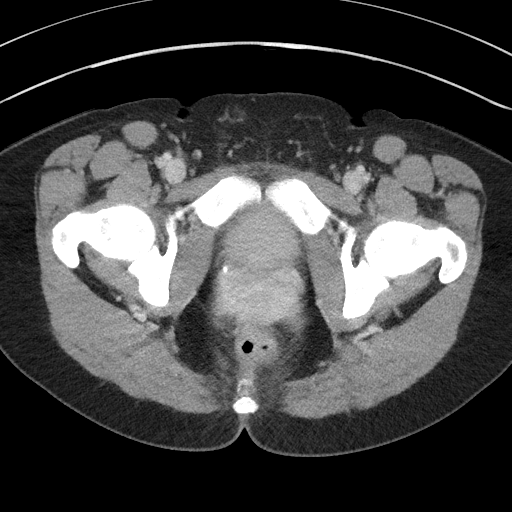
[im 24/103  soft-tissue]
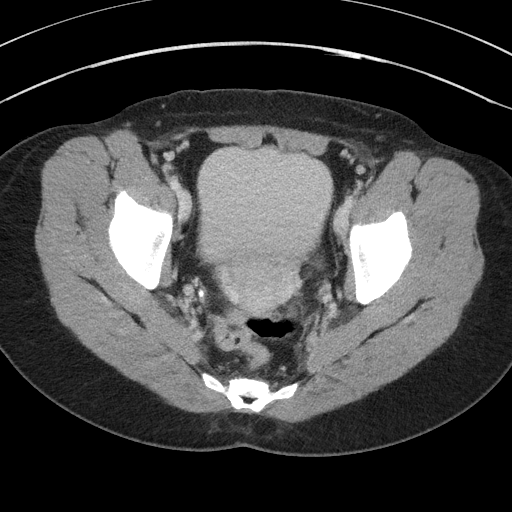
[im 32/103  soft-tissue]
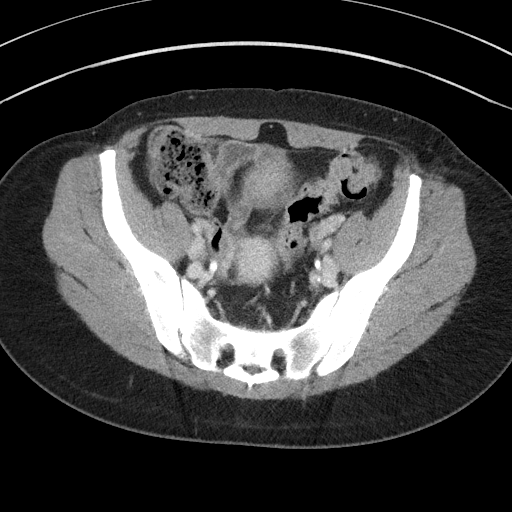
[im 40/103  soft-tissue]
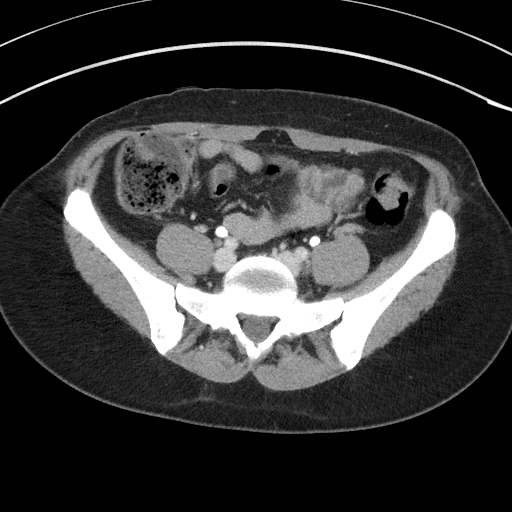
[im 48/103  soft-tissue]
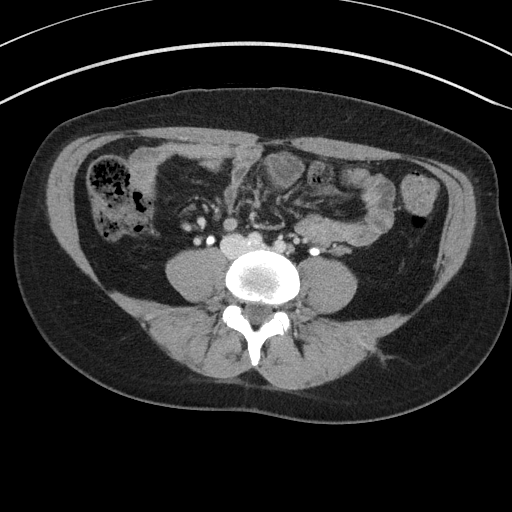
[im 55/103  soft-tissue]
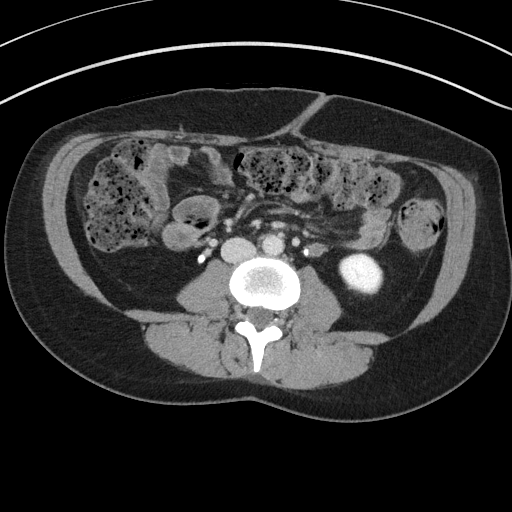
[im 63/103  soft-tissue]
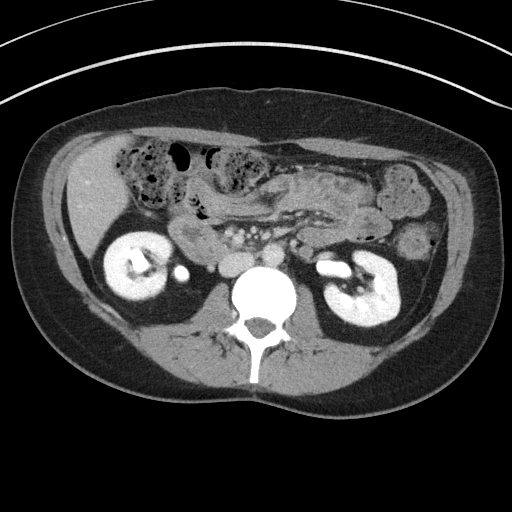
[im 71/103  soft-tissue]
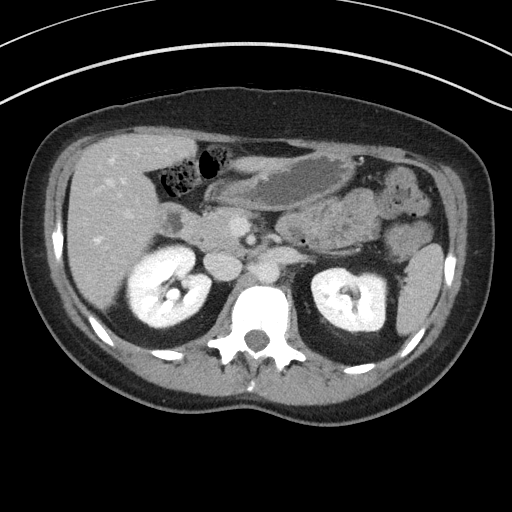
[im 71/103  lung]
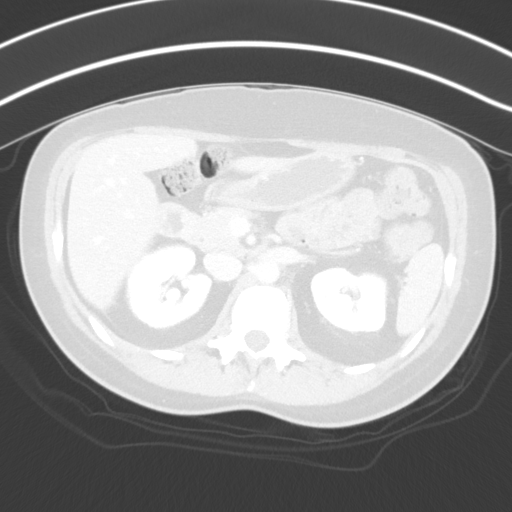
[im 71/103  bone]
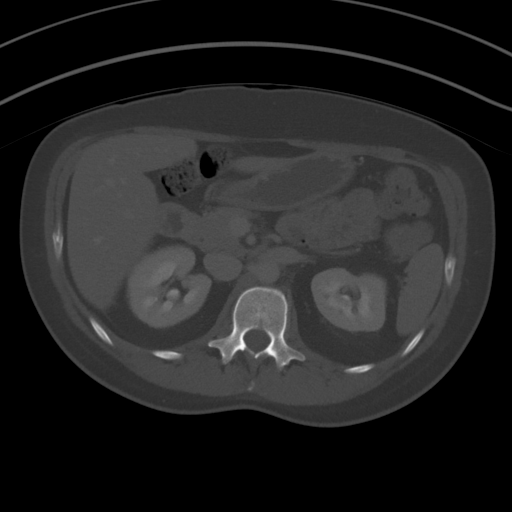
[im 79/103  soft-tissue]
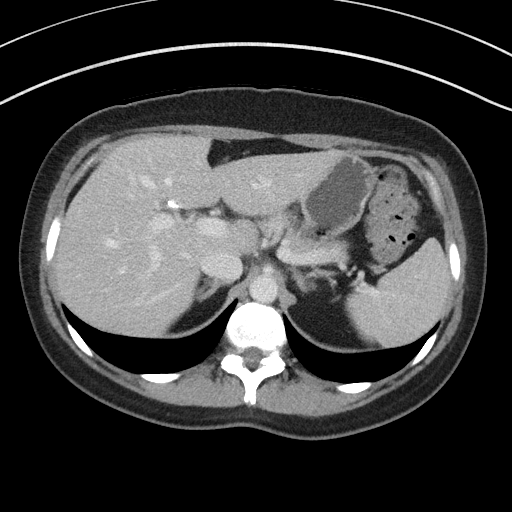
[im 79/103  lung]
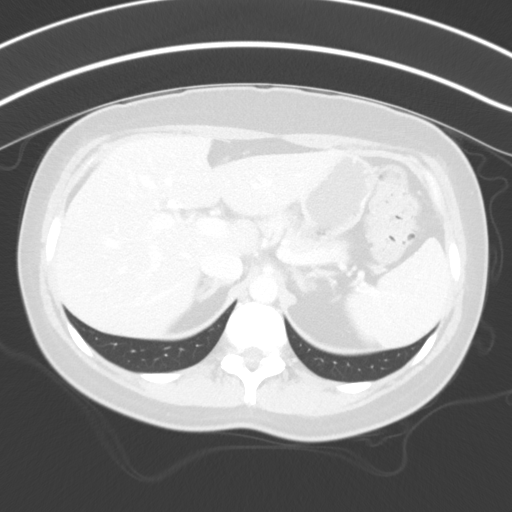
[im 87/103  soft-tissue]
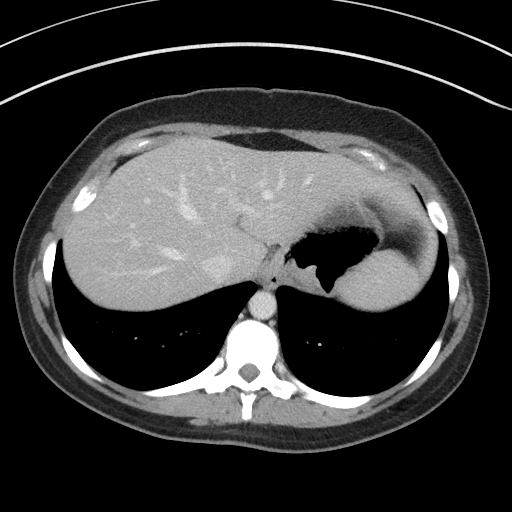
[im 87/103  lung]
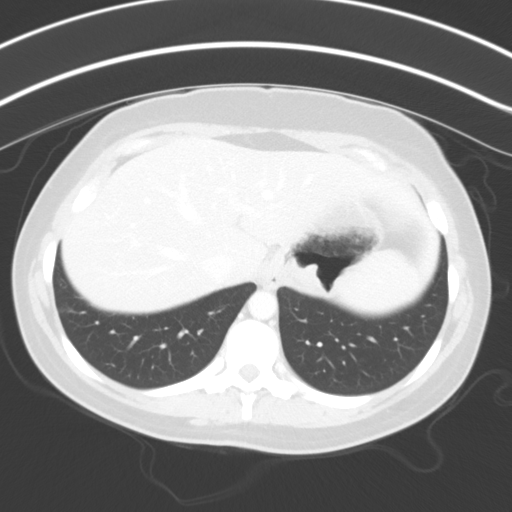
[im 95/103  soft-tissue]
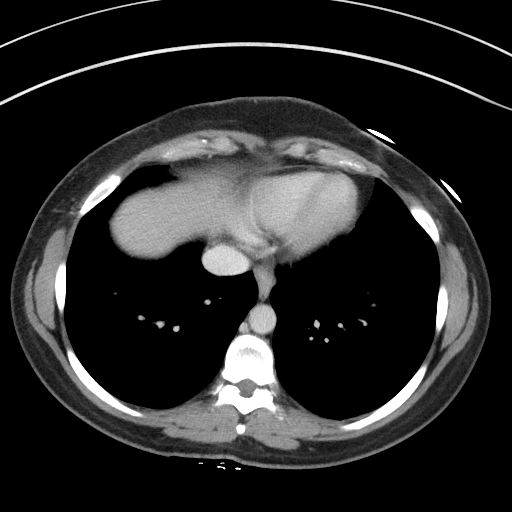
[im 95/103  lung]
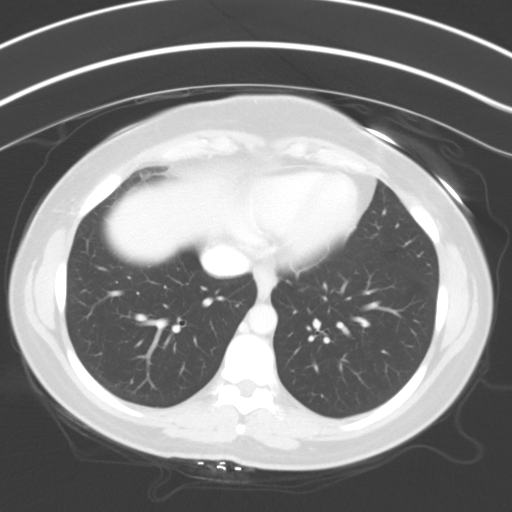

[12 of 32 positions shown; findings below may reference images not displayed]

FINDINGS: Lower chest:  Unremarkable.

Hepatobiliary: No cystic or solid hepatic lesions. No intra or
extrahepatic biliary ductal dilatation. Status post cholecystectomy.

Pancreas: No pancreatic mass. No pancreatic ductal dilatation. No
pancreatic or peripancreatic fluid or inflammatory changes.

Spleen: Unremarkable.

Adrenals/Urinary Tract: 3 mm nonobstructive calculus in the
interpolar collecting system of the right kidney. No additional
calculi are identified within the left renal collecting system,
along the course of either ureter or within the lumen of the urinary
bladder. No hydroureteronephrosis or perinephric stranding to
indicate urinary tract obstruction at this time. Post-contrast
images demonstrate no suspicious renal lesions. There are no filling
defects within the collecting system of either kidney, along the
course of either ureter or within the lumen of the urinary bladder
to strongly suggest the presence of a urothelial neoplasm at this
time.

Stomach/Bowel: Normal appearance of the stomach. No pathologic
dilatation of small bowel or colon. Normal appendix.

Vascular/Lymphatic: Minimal atherosclerotic disease in the abdominal
and pelvic vasculature, without evidence of aneurysm or dissection.
No lymphadenopathy noted in the abdomen or pelvis.

Reproductive: Uterus and ovaries are unremarkable in appearance.

Other: Trace volume of free fluid in the cul-de-sac, presumably
physiologic. No larger volume of ascites. No pneumoperitoneum.

Musculoskeletal: There are no aggressive appearing lytic or blastic
lesions noted in the visualized portions of the skeleton.
IMPRESSION: 1. 3 mm nonobstructive calculus in the interpolar collecting system
of the right kidney. No ureteral stones or findings of urinary tract
obstruction are noted at this time.
2. No other findings to account for the patient's history of
hematuria or other urinary symptoms.
3. Normal appendix.
4. Mild atherosclerosis.
5. Additional incidental findings, as above.

## 2017-10-20 MED ORDER — ESZOPICLONE 2 MG PO TABS: 2.0000 mg | ORAL_TABLET | Freq: Every day | ORAL | 3 refills | Status: DC

## 2017-10-20 MED ORDER — NITROFURANTOIN MONOHYD MACRO 100 MG PO CAPS: 100.0000 mg | ORAL_CAPSULE | Freq: Two times a day (BID) | ORAL | 0 refills | Status: DC

## 2017-10-20 MED ORDER — MONTELUKAST SODIUM 10 MG PO TABS: 10.0000 mg | ORAL_TABLET | Freq: Every day | ORAL | 5 refills | Status: DC

## 2017-10-20 MED ORDER — PHENAZOPYRIDINE HCL 200 MG PO TABS: 200.0000 mg | ORAL_TABLET | Freq: Three times a day (TID) | ORAL | 0 refills | Status: DC | PRN

## 2017-10-20 MED ORDER — SERTRALINE HCL 50 MG PO TABS: 75.0000 mg | ORAL_TABLET | Freq: Every day | ORAL | 3 refills | Status: DC

## 2017-10-20 NOTE — Addendum Note (Signed)
Addended by: Lenon Oms on: 10/20/2017 04:30 PM   Modules accepted: Orders

## 2017-10-20 NOTE — Progress Notes (Signed)
[facility] [place], [place] [address]  Internal MEDICINE  Office Visit Note  Patient Name: Kayla Jimenez  [id number]  [id number]  Date of Service: 10/20/2017   Pt is here for a sick visit.  Chief Complaint  Patient presents with  . Urinary Tract Infection    burning, pain, frequency been going on since last week     The patient is having surgery on her right foot on Friday. She will have to miss her upcoming follow up appointment. She woud like to have refills of routine medications today and move back her follow up appointment.   Urinary Tract Infection   This is a new problem. The current episode started in the past 7 days. The problem occurs intermittently. The problem has been unchanged. The quality of the pain is described as burning and aching. The pain is at a severity of 4/10. The pain is moderate. There has been no fever. She is sexually active. There is a history of pyelonephritis. Associated symptoms include a discharge, flank pain, frequency, hesitancy, nausea and urgency. Pertinent negatives include no vomiting. She has tried nothing for the symptoms. Her past medical history is significant for recurrent UTIs.        Current Medication:  Outpatient Encounter Medications as of 10/20/2017  Medication Sig  . amphetamine-dextroamphetamine (ADDERALL XR) 30 MG 24 hr capsule Take 1 capsule (30 mg total) by mouth daily.  . cetirizine (ZYRTEC) 10 MG tablet Take 10 mg by mouth daily. Reported on 08/14/2015  . eszopiclone (LUNESTA) 2 MG TABS tablet Take 1 tablet (2 mg total) by mouth at bedtime.  . fluconazole (DIFLUCAN) 150 MG tablet Take 1 tablet po once. May repeat dose in 3 days as needed for persistent symptoms.  Marland Kitchen ibuprofen (ADVIL,MOTRIN) 600 MG tablet 1  po pc every 6 hours for 5 days then as needed for pain  . lidocaine (XYLOCAINE) 2 % solution Lidocaine Viscous 2 % mucosal solution  . montelukast (SINGULAIR) 10 MG tablet Take 1 tablet (10  mg total) by mouth at bedtime.  . Probiotic Product ([place]) Take by mouth daily.  . sertraline (ZOLOFT) 50 MG tablet Take 1.5 tablets (75 mg total) by mouth at bedtime.  Marland Kitchen tinidazole (TINDAMAX) 500 MG tablet tinidazole 500 mg tablet  TAKE 4 TABLETS BY MOUTH EVERY DAY FOR 2 DAYS  . Vitamin D, Ergocalciferol, (DRISDOL) 50000 units CAPS capsule Take 1 capsule (50,000 Units total) by mouth every 7 (seven) days. Wednesdays  . [DISCONTINUED] eszopiclone (LUNESTA) 2 MG TABS tablet Take 1 tablet (2 mg total) by mouth at bedtime.  . [DISCONTINUED] montelukast (SINGULAIR) 10 MG tablet Take 1 tablet (10 mg total) by mouth at bedtime.  . [DISCONTINUED] sertraline (ZOLOFT) 50 MG tablet Take 1.5 tablets (75 mg total) by mouth at bedtime.  . nitrofurantoin, macrocrystal-monohydrate, (MACROBID) 100 MG capsule Take 1 capsule (100 mg total) by mouth 2 (two) times daily.  . phenazopyridine (PYRIDIUM) 200 MG tablet Take 1 tablet (200 mg total) by mouth 3 (three) times daily as needed for pain.   No facility-administered encounter medications on file as of 10/20/2017.       Medical History: Past Medical History:  Diagnosis Date  . Anxiety 08/29/10  . BV (bacterial vaginosis) 08/29/10  . Complication of anesthesia 2004   vomited  . Gross hematuria 07/08/2015  . H/O bladder infections   . H/O mumps   . H/O toxoplasmosis   . H/O varicella   .  History of kidney stones   . Infections of kidney    several kidney stones, bladder infections  . Kidney disease 09/03/2011   Left ureteral calculus with partial obstruction, passed 06/09/2014. CT did show perinephric stranding.   . Menometrorrhagia 06/26/10  . Plantar fasciitis   . PMS (premenstrual syndrome) 07/10/11  . Post - coital bleeding 08/29/10  . Renal calculus, left   . Sinus infection   . UTI (lower urinary tract infection) 07/08/2015  . Yeast infection      Vital Signs: BP (!) 138/92 (BP Location: Left Arm, Patient Position: Sitting,  Cuff Size: Normal)   Pulse 79   Resp 16   Ht 5\' 3"  (1.6 m)   Wt 160 lb 3.2 oz (72.7 kg)   SpO2 95%   BMI 28.38 kg/m    Review of Systems  Constitutional: Negative for activity change, fatigue and fever.  HENT: Negative for congestion, ear pain, postnasal drip, rhinorrhea and voice change.   Eyes: Negative.   Respiratory: Negative for apnea, cough, shortness of breath and wheezing.   Cardiovascular: Negative for chest pain and palpitations.  Gastrointestinal: Positive for nausea. Negative for constipation, diarrhea and vomiting.  Endocrine: Negative for cold intolerance, heat intolerance, polydipsia, polyphagia and polyuria.  Genitourinary: Positive for difficulty urinating, flank pain, frequency, hesitancy and urgency.  Musculoskeletal: Negative for arthralgias, back pain and myalgias.  Skin: Positive for rash.  Allergic/Immunologic: Positive for environmental allergies.  Neurological: Negative for weakness, numbness and headaches.  Hematological: Negative.   Psychiatric/Behavioral: Positive for sleep disturbance. Negative for decreased concentration. The patient is nervous/anxious.     Physical Exam  Constitutional: She is oriented to person, place, and time. She appears well-developed and well-nourished. No distress.  HENT:  Head: Normocephalic and atraumatic.  Nose: Nose normal.  Mouth/Throat: Oropharynx is clear and moist. No oropharyngeal exudate.  Eyes: Pupils are equal, round, and reactive to light. EOM are normal.  Neck: Normal range of motion. Neck supple. No JVD present. No tracheal deviation present. No thyromegaly present.  Cardiovascular: Normal rate, regular rhythm and normal heart sounds. Exam reveals no gallop and no friction rub.  No murmur heard. Pulmonary/Chest: Effort normal and breath sounds normal. No respiratory distress. She has no wheezes. She has no rales. She exhibits no tenderness.  Abdominal: Soft. Bowel sounds are normal. There is no tenderness.   Genitourinary:  Genitourinary Comments: Urine sample positive for trace WBC   Musculoskeletal: Normal range of motion.  Lymphadenopathy:    She has no cervical adenopathy.  Neurological: She is alert and oriented to person, place, and time. No cranial nerve deficit.  Skin: Skin is warm and dry. Capillary refill takes 2 to 3 seconds. She is not diaphoretic.  Psychiatric: She has a normal mood and affect. Her behavior is normal. Judgment and thought content normal.  Nursing note and vitals reviewed.  Assessment/Plan: 1. Urinary tract infection without hematuria, site unspecified U/a positive for trace WBC. Add macrobid 100mg  bid for 10 days. Send urine for culture and sensitivity and adjust abx as indicated.  - nitrofurantoin, macrocrystal-monohydrate, (MACROBID) 100 MG capsule; Take 1 capsule (100 mg total) by mouth 2 (two) times daily.  Dispense: 20 capsule; Refill: 0  2. Dysuria pyridium200mg  may e taken up to tid prn bladder pain/spasms.  - POCT Urinalysis Dipstick - phenazopyridine (PYRIDIUM) 200 MG tablet; Take 1 tablet (200 mg total) by mouth 3 (three) times daily as needed for pain.  Dispense: 10 tablet; Refill: 0 - CULTURE, URINE COMPREHENSIVE  3.  Allergic rhinitis due to pollen, unspecified seasonality - montelukast (SINGULAIR) 10 MG tablet; Take 1 tablet (10 mg total) by mouth at bedtime.  Dispense: 30 tablet; Refill: 5  4. Moderate major depression (HCC) - sertraline (ZOLOFT) 50 MG tablet; Take 1.5 tablets (75 mg total) by mouth at bedtime.  Dispense: 45 tablet; Refill: 3  5. Primary insomnia - eszopiclone (LUNESTA) 2 MG TABS tablet; Take 1 tablet (2 mg total) by mouth at bedtime.  Dispense: 30 tablet; Refill: 3  General Counseling: Freda verbalizes understanding of the findings of todays visit and agrees with plan of treatment. I have discussed any further diagnostic evaluation that may be needed or ordered today. We also reviewed her medications today. she has been  encouraged to call the office with any questions or concerns that should arise related to todays visit.    Counseling:  This patient was seen by Leretha Pol FNP Collaboration with Dr Lavera Guise as a part of collaborative care agreement  Orders Placed This Encounter  Procedures  . CULTURE, URINE COMPREHENSIVE  . POCT Urinalysis Dipstick    Meds ordered this encounter  Medications  . nitrofurantoin, macrocrystal-monohydrate, (MACROBID) 100 MG capsule    Sig: Take 1 capsule (100 mg total) by mouth 2 (two) times daily.    Dispense:  20 capsule    Refill:  0    Order Specific Question:   Supervising Provider    Answer:   Lavera Guise [[id number]]  . phenazopyridine (PYRIDIUM) 200 MG tablet    Sig: Take 1 tablet (200 mg total) by mouth 3 (three) times daily as needed for pain.    Dispense:  10 tablet    Refill:  0    Order Specific Question:   Supervising Provider    Answer:   Lavera Guise [[id number]]  . montelukast (SINGULAIR) 10 MG tablet    Sig: Take 1 tablet (10 mg total) by mouth at bedtime.    Dispense:  30 tablet    Refill:  5    Order Specific Question:   Supervising Provider    Answer:   Lavera Guise [[id number]]  . sertraline (ZOLOFT) 50 MG tablet    Sig: Take 1.5 tablets (75 mg total) by mouth at bedtime.    Dispense:  45 tablet    Refill:  3    Order Specific Question:   Supervising Provider    Answer:   Lavera Guise [[id number]]  . eszopiclone (LUNESTA) 2 MG TABS tablet    Sig: Take 1 tablet (2 mg total) by mouth at bedtime.    Dispense:  30 tablet    Refill:  3    Order Specific Question:   Supervising Provider    Answer:   Lavera Guise [[id number]]    Time spent: 15 Minutes

## 2017-10-21 ENCOUNTER — Other Ambulatory Visit: Payer: Self-pay | Admitting: Podiatry

## 2017-10-21 MED ORDER — HYDROMORPHONE HCL 4 MG PO TABS: 4.0000 mg | ORAL_TABLET | Freq: Four times a day (QID) | ORAL | 0 refills | Status: AC | PRN

## 2017-10-21 MED ORDER — ONDANSETRON HCL 4 MG PO TABS: 4.0000 mg | ORAL_TABLET | Freq: Three times a day (TID) | ORAL | 0 refills | Status: DC | PRN

## 2017-10-21 MED ORDER — CEPHALEXIN 500 MG PO CAPS: 500.0000 mg | ORAL_CAPSULE | Freq: Three times a day (TID) | ORAL | 0 refills | Status: DC

## 2017-10-22 ENCOUNTER — Other Ambulatory Visit: Payer: Self-pay

## 2017-10-22 MED ORDER — VITAMIN D (ERGOCALCIFEROL) 1.25 MG (50000 UNIT) PO CAPS: 50000.0000 [IU] | ORAL_CAPSULE | ORAL | 1 refills | Status: DC

## 2017-10-23 LAB — CULTURE, URINE COMPREHENSIVE

## 2017-10-27 ENCOUNTER — Ambulatory Visit: Payer: Self-pay | Admitting: Nurse Practitioner

## 2017-10-28 ENCOUNTER — Encounter: Payer: BC Managed Care – PPO | Admitting: Podiatry

## 2017-10-28 ENCOUNTER — Telehealth: Payer: Self-pay | Admitting: *Deleted

## 2017-10-28 NOTE — Telephone Encounter (Signed)
"  I'm calling with some questions about an upcoming surgery for next Friday with Dr. Milinda Pointer.  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  I'm a Pharmacist, hospital.  Will I be able to go back on the 14th or will I be out longer?  Give me a call as soon as you can."

## 2017-10-28 NOTE — Telephone Encounter (Signed)
"  I'm scheduled to have surgery next week.  I'm calling to see if he has had any cancellations for this Friday.  "I am sorry, he has not had any cancellations for this Friday.

## 2017-11-04 ENCOUNTER — Encounter: Payer: BC Managed Care – PPO | Admitting: Podiatry

## 2017-11-04 ENCOUNTER — Encounter: Payer: Self-pay | Admitting: Podiatry

## 2017-11-04 ENCOUNTER — Ambulatory Visit: Payer: BC Managed Care – PPO | Admitting: Podiatry

## 2017-11-04 DIAGNOSIS — M7751 Other enthesopathy of right foot: Secondary | ICD-10-CM

## 2017-11-04 NOTE — Progress Notes (Signed)
She presents today for follow-up of her capsulitis second metatarsophalangeal joint of the right foot states that she is got another infection and also has found a polyp that needs to be removed she were going to have to delay her surgery.  Objective: She has pain on palpation second metatarsophalangeal joint of the right foot pulses remain palpable neurologic sensorium is intact degenerative flexors are intact.  Assessment: Pain in limb secondary to capsulitis right second toe.  Plan: Injected today with 2 mg of dexamethasone local anesthetic after sterile Betadine skin prep.  Tolerated procedure well without complications.  Also reschedule her surgery for November 22.

## 2017-11-05 NOTE — Telephone Encounter (Signed)
We rescheduled her surgery to 02/26/2018 because she still has a UTI.

## 2017-11-11 ENCOUNTER — Encounter: Payer: BC Managed Care – PPO | Admitting: Podiatry

## 2017-11-18 ENCOUNTER — Encounter: Payer: BC Managed Care – PPO | Admitting: Podiatry

## 2017-11-19 DIAGNOSIS — Z90710 Acquired absence of both cervix and uterus: Secondary | ICD-10-CM | POA: Insufficient documentation

## 2017-11-27 ENCOUNTER — Other Ambulatory Visit: Payer: Self-pay | Admitting: Nurse Practitioner

## 2017-11-28 LAB — TSH: TSH: 1.08 u[IU]/mL (ref 0.450–4.500)

## 2017-11-28 LAB — LIPID PANEL W/O CHOL/HDL RATIO
Cholesterol, Total: 196 mg/dL (ref 100–199)
HDL: 33 mg/dL — ABNORMAL LOW (ref >39)
LDL CALC: 129 mg/dL — AB (ref 0–99)
Triglycerides: 170 mg/dL — ABNORMAL HIGH (ref 0–149)
VLDL CHOLESTEROL CAL: 34 mg/dL (ref 5–40)

## 2017-11-28 LAB — COMPREHENSIVE METABOLIC PANEL
ALBUMIN: 3.8 g/dL (ref 3.5–5.5)
ALT: 12 IU/L (ref 0–32)
AST: 10 IU/L (ref 0–40)
Albumin/Globulin Ratio: 1.5 (ref 1.2–2.2)
Alkaline Phosphatase: 74 IU/L (ref 39–117)
BUN / CREAT RATIO: 17 (ref 9–23)
BUN: 10 mg/dL (ref 6–24)
Bilirubin Total: 0.2 mg/dL (ref 0.0–1.2)
CO2: 22 mmol/L (ref 20–29)
CREATININE: 0.59 mg/dL (ref 0.57–1.00)
Calcium: 8.8 mg/dL (ref 8.7–10.2)
Chloride: 102 mmol/L (ref 96–106)
GFR calc Af Amer: 131 mL/min/{1.73_m2} (ref >59)
GFR calc non Af Amer: 113 mL/min/{1.73_m2} (ref >59)
GLUCOSE: 91 mg/dL (ref 65–99)
Globulin, Total: 2.6 g/dL (ref 1.5–4.5)
Potassium: 4.5 mmol/L (ref 3.5–5.2)
Sodium: 140 mmol/L (ref 134–144)
TOTAL PROTEIN: 6.4 g/dL (ref 6.0–8.5)

## 2017-11-28 LAB — CBC
HEMATOCRIT: 39.1 % (ref 34.0–46.6)
HEMOGLOBIN: 13.5 g/dL (ref 11.1–15.9)
MCH: 33 pg (ref 26.6–33.0)
MCHC: 34.5 g/dL (ref 31.5–35.7)
MCV: 96 fL (ref 79–97)
Platelets: 332 10*3/uL (ref 150–450)
RBC: 4.09 x10E6/uL (ref 3.77–5.28)
RDW: 12 % — AB (ref 12.3–15.4)
WBC: 10.8 10*3/uL (ref 3.4–10.8)

## 2017-11-28 LAB — VITAMIN D 25 HYDROXY (VIT D DEFICIENCY, FRACTURES): Vit D, 25-Hydroxy: 47.4 ng/mL (ref 30.0–100.0)

## 2017-11-28 LAB — T4, FREE: FREE T4: 1.1 ng/dL (ref 0.82–1.77)

## 2018-01-18 ENCOUNTER — Ambulatory Visit
Admission: RE | Admit: 2018-01-18 | Discharge: 2018-01-18 | Disposition: A | Discharge status: Home / Self Care | Payer: BC Managed Care – PPO | Source: Ambulatory Visit | Attending: Unknown Physician Specialty | Admitting: Unknown Physician Specialty

## 2018-01-18 ENCOUNTER — Other Ambulatory Visit: Payer: Self-pay | Admitting: Unknown Physician Specialty

## 2018-01-18 DIAGNOSIS — J324 Chronic pansinusitis: Secondary | ICD-10-CM

## 2018-02-04 ENCOUNTER — Ambulatory Visit: Payer: Self-pay | Admitting: Nurse Practitioner

## 2018-02-22 ENCOUNTER — Ambulatory Visit: Payer: BC Managed Care – PPO | Admitting: Adult Health

## 2018-02-22 ENCOUNTER — Encounter: Payer: Self-pay | Admitting: Adult Health

## 2018-02-22 VITALS — BP 122/76 | HR 90 | Resp 16 | Ht 63.0 in | Wt 147.0 lb

## 2018-02-22 DIAGNOSIS — Z79899 Other long term (current) drug therapy: Secondary | ICD-10-CM | POA: Diagnosis not present

## 2018-02-22 DIAGNOSIS — F5101 Primary insomnia: Secondary | ICD-10-CM

## 2018-02-22 DIAGNOSIS — F321 Major depressive disorder, single episode, moderate: Secondary | ICD-10-CM | POA: Diagnosis not present

## 2018-02-22 DIAGNOSIS — R41840 Attention and concentration deficit: Secondary | ICD-10-CM | POA: Diagnosis not present

## 2018-02-22 LAB — POCT URINE DRUG SCREEN
POC Amphetamine UR: NOT DETECTED
POC BENZODIAZEPINES UR: POSITIVE — AB
POC Barbiturate UR: NOT DETECTED
POC COCAINE UR: NOT DETECTED
POC METHADONE UR: NOT DETECTED
POC Marijuana UR: NOT DETECTED
POC Methamphetamine UR: NOT DETECTED
POC OPIATE UR: NOT DETECTED
POC Oxycodone UR: NOT DETECTED
POC PHENCYCLIDINE UR: NOT DETECTED
POC TRICYCLICS UR: NOT DETECTED

## 2018-02-22 MED ORDER — ESZOPICLONE 2 MG PO TABS: 2.0000 mg | ORAL_TABLET | Freq: Every day | ORAL | 3 refills | Status: DC

## 2018-02-22 MED ORDER — SERTRALINE HCL 50 MG PO TABS: 75.0000 mg | ORAL_TABLET | Freq: Every day | ORAL | 3 refills | Status: DC

## 2018-02-22 MED ORDER — AMPHETAMINE-DEXTROAMPHET ER 30 MG PO CP24: 30.0000 mg | ORAL_CAPSULE | Freq: Every day | ORAL | 0 refills | Status: DC

## 2018-02-22 MED ORDER — AMPHETAMINE-DEXTROAMPHET ER 30 MG PO CP24: 30.0000 mg | ORAL_CAPSULE | ORAL | 0 refills | Status: DC

## 2018-02-22 NOTE — Patient Instructions (Signed)

## 2018-02-22 NOTE — Progress Notes (Signed)
[facility] [place], [place] [address]  Internal MEDICINE  Office Visit Note  Patient Name: Kayla Jimenez  [id number]  [id number]  Date of Service: 02/22/2018  Chief Complaint  Patient presents with  . Depression    medication refills   . Anxiety  . ADHD    HPI  Pt is here for follow up on Depression, anxiety and ADHD.  She is also hear to review lab results from last visit.  She denies any recent issues with her depression or anxiety.  Her ADHD is well controlled using Adderall.  Her lab results were discussed with her and we discussed a diet to help improve her cholesterol levels.  We will recheck her cholesterol at a future date.  She is requesting more medication refills at this visit.   Current Medication: Outpatient Encounter Medications as of 02/22/2018  Medication Sig  . amphetamine-dextroamphetamine (ADDERALL XR) 30 MG 24 hr capsule Take 1 capsule (30 mg total) by mouth daily.  . eszopiclone (LUNESTA) 2 MG TABS tablet Take 1 tablet (2 mg total) by mouth at bedtime.  Marland Kitchen ibuprofen (ADVIL,MOTRIN) 600 MG tablet 1  po pc every 6 hours for 5 days then as needed for pain  . montelukast (SINGULAIR) 10 MG tablet Take 1 tablet (10 mg total) by mouth at bedtime.  . phenazopyridine (PYRIDIUM) 200 MG tablet Take 1 tablet (200 mg total) by mouth 3 (three) times daily as needed for pain.  . Probiotic Product ([employer]) Take by mouth daily.  . sertraline (ZOLOFT) 50 MG tablet Take 1.5 tablets (75 mg total) by mouth at bedtime.  . Triamcinolone Acetonide (NASACORT ALLERGY 24HR NA) Place into the nose.  . [DISCONTINUED] amphetamine-dextroamphetamine (ADDERALL XR) 30 MG 24 hr capsule Take 1 capsule (30 mg total) by mouth daily.  . [DISCONTINUED] eszopiclone (LUNESTA) 2 MG TABS tablet Take 1 tablet (2 mg total) by mouth at bedtime.  . [DISCONTINUED] sertraline (ZOLOFT) 50 MG tablet Take 1.5 tablets (75 mg total) by mouth at bedtime.  Derrill Memo  ON 03/24/2018] amphetamine-dextroamphetamine (ADDERALL XR) 30 MG 24 hr capsule Take 1 capsule (30 mg total) by mouth every morning.  . [DISCONTINUED] cephALEXin (KEFLEX) 500 MG capsule Take 1 capsule (500 mg total) by mouth 3 (three) times daily. (Patient not taking: Reported on 02/22/2018)  . [DISCONTINUED] cetirizine (ZYRTEC) 10 MG tablet Take 10 mg by mouth daily. Reported on 08/14/2015  . [DISCONTINUED] fluconazole (DIFLUCAN) 150 MG tablet Take 1 tablet po once. May repeat dose in 3 days as needed for persistent symptoms. (Patient not taking: Reported on 02/22/2018)  . [DISCONTINUED] lidocaine (XYLOCAINE) 2 % solution Lidocaine Viscous 2 % mucosal solution  . [DISCONTINUED] nitrofurantoin, macrocrystal-monohydrate, (MACROBID) 100 MG capsule Take 1 capsule (100 mg total) by mouth 2 (two) times daily. (Patient not taking: Reported on 02/22/2018)  . [DISCONTINUED] ondansetron (ZOFRAN) 4 MG tablet Take 1 tablet (4 mg total) by mouth every 8 (eight) hours as needed for nausea or vomiting. (Patient not taking: Reported on 02/22/2018)  . [DISCONTINUED] tinidazole (TINDAMAX) 500 MG tablet tinidazole 500 mg tablet  TAKE 4 TABLETS BY MOUTH EVERY DAY FOR 2 DAYS  . [DISCONTINUED] Vitamin D, Ergocalciferol, (DRISDOL) 50000 units CAPS capsule Take 1 capsule (50,000 Units total) by mouth every 7 (seven) days. Wednesdays (Patient not taking: Reported on 02/22/2018)   No facility-administered encounter medications on file as of 02/22/2018.     Surgical History: Past Surgical History:  Procedure Laterality Date  . ABDOMINAL  HYSTERECTOMY    . ANTERIOR AND POSTERIOR REPAIR  03/16/2012   Procedure: ANTERIOR (CYSTOCELE) AND POSTERIOR REPAIR (RECTOCELE);  Surgeon: [name], MD;  Location: [facility];  Service: Gynecology;  Laterality: N/A;  anterior repair/cysto  . BLADDER SUSPENSION  03/16/2012   Procedure: TRANSVAGINAL TAPE (TVT) PROCEDURE;  Surgeon: [name], MD;  Location: [facility];  Service:  Gynecology;  Laterality: N/A;  . BREAST REDUCTION SURGERY  6/04  . CHOLECYSTECTOMY  06/28/14  . CYSTOSCOPY  03/16/2012   Procedure: CYSTOSCOPY;  Surgeon: [name], MD;  Location: [facility];  Service: Gynecology;  Laterality: N/A;  . CYSTOSCOPY  03/16/2017   Procedure: CYSTOSCOPY;  Surgeon: Everett Graff, MD;  Location: [facility];  Service: Gynecology;;  . Murrell Redden & CURRETTAGE/HYSTROSCOPY WITH NOVASURE ABLATION  03/16/2012   Procedure: DILATATION & CURETTAGE/HYSTEROSCOPY WITH NOVASURE ABLATION;  Surgeon: [name], MD;  Location: [facility];  Service: Gynecology;  Laterality: N/A;  . TONSILLECTOMY AND ADENOIDECTOMY    . VAGINAL HYSTERECTOMY Bilateral 03/16/2017   Procedure: HYSTERECTOMY VAGINAL Bilateral Salpingectomy ;  Surgeon: Everett Graff, MD;  Location: [facility];  Service: Gynecology;  Laterality: Bilateral;    Medical History: Past Medical History:  Diagnosis Date  . Anxiety 08/29/10  . BV (bacterial vaginosis) 08/29/10  . Complication of anesthesia 2004   vomited  . Gross hematuria 07/08/2015  . H/O bladder infections   . H/O mumps   . H/O toxoplasmosis   . H/O varicella   . History of kidney stones   . Infections of kidney    several kidney stones, bladder infections  . Kidney disease 09/03/2011   Left ureteral calculus with partial obstruction, passed 06/09/2014. CT did show perinephric stranding.   . Menometrorrhagia 06/26/10  . Plantar fasciitis   . PMS (premenstrual syndrome) 07/10/11  . Post - coital bleeding 08/29/10  . Renal calculus, left   . Sinus infection   . UTI (lower urinary tract infection) 07/08/2015  . Yeast infection     Family History: Family History  Problem Relation Age of Onset  . Cancer Other        breast  . Cancer Mother        cervical  . Depression Mother   . Drug abuse Mother   . Anemia Mother        blood transfusion  . Migraines Mother     Social History   Socioeconomic History  . Marital status: Married    Spouse name:  Not on file  . Number of children: Not on file  . Years of education: Not on file  . Highest education level: Not on file  Occupational History  . Not on file  Social Needs  . Financial resource strain: Not on file  . Food insecurity:    Worry: Not on file    Inability: Not on file  . Transportation needs:    Medical: Not on file    Non-medical: Not on file  Tobacco Use  . Smoking status: Never Smoker  . Smokeless tobacco: Never Used  Substance and Sexual Activity  . Alcohol use: No  . Drug use: No  . Sexual activity: Yes    Birth control/protection: Surgical    Comment: pt spouse had vasec.   Lifestyle  . Physical activity:    Days per week: Not on file    Minutes per session: Not on file  . Stress: Not on file  Relationships  . Social connections:    Talks on  phone: Not on file    Gets together: Not on file    Attends religious service: Not on file    Active member of club or organization: Not on file    Attends meetings of clubs or organizations: Not on file    Relationship status: Not on file  . Intimate partner violence:    Fear of current or ex partner: Not on file    Emotionally abused: Not on file    Physically abused: Not on file    Forced sexual activity: Not on file  Other Topics Concern  . Not on file  Social History Narrative  . Not on file      Review of Systems  Constitutional: Negative for chills, fatigue and unexpected weight change.  HENT: Negative for congestion, rhinorrhea, sneezing and sore throat.   Eyes: Negative for photophobia, pain and redness.  Respiratory: Negative for cough, chest tightness and shortness of breath.   Cardiovascular: Negative for chest pain and palpitations.  Gastrointestinal: Negative for abdominal pain, constipation, diarrhea, nausea and vomiting.  Endocrine: Negative.   Genitourinary: Negative for dysuria and frequency.  Musculoskeletal: Negative for arthralgias, back pain, joint swelling and neck pain.  Skin:  Negative for rash.  Allergic/Immunologic: Negative.   Neurological: Negative for tremors and numbness.  Hematological: Negative for adenopathy. Does not bruise/bleed easily.  Psychiatric/Behavioral: Negative for behavioral problems and sleep disturbance. The patient is not nervous/anxious.     Vital Signs: BP 122/76 (BP Location: Left Arm, Patient Position: Sitting, Cuff Size: Normal)   Pulse 90   Resp 16   Ht 5\' 3"  (1.6 m)   Wt 147 lb (66.7 kg)   SpO2 99%   BMI 26.04 kg/m    Physical Exam  Constitutional: She is oriented to person, place, and time. She appears well-developed and well-nourished. No distress.  HENT:  Head: Normocephalic and atraumatic.  Mouth/Throat: Oropharynx is clear and moist. No oropharyngeal exudate.  Eyes: Pupils are equal, round, and reactive to light. EOM are normal.  Neck: Normal range of motion. Neck supple. No JVD present. No tracheal deviation present. No thyromegaly present.  Cardiovascular: Normal rate, regular rhythm and normal heart sounds. Exam reveals no gallop and no friction rub.  No murmur heard. Pulmonary/Chest: Effort normal and breath sounds normal. No respiratory distress. She has no wheezes. She has no rales. She exhibits no tenderness.  Abdominal: Soft. There is no tenderness. There is no guarding.  Musculoskeletal: Normal range of motion.  Lymphadenopathy:    She has no cervical adenopathy.  Neurological: She is alert and oriented to person, place, and time. No cranial nerve deficit.  Skin: Skin is warm and dry. She is not diaphoretic.  Psychiatric: She has a normal mood and affect. Her behavior is normal. Judgment and thought content normal.  Nursing note and vitals reviewed.      Assessment/Plan: 1. Attention and concentration deficit Patient's Adderall refilled at this time.  She will return for follow-up and refills in 3 months. - amphetamine-dextroamphetamine (ADDERALL XR) 30 MG 24 hr capsule; Take 1 capsule (30 mg total)  by mouth daily.  Dispense: 30 capsule; Refill: 0 - amphetamine-dextroamphetamine (ADDERALL XR) 30 MG 24 hr capsule; Take 1 capsule (30 mg total) by mouth every morning.  Dispense: 30 capsule; Refill: 0  2. Encounter for long-term (current) use of high-risk medication Urine drug screen positive for amphetamines and the diazepam as expected. - POCT Urine Drug Screen  3. Moderate major depression ([place]) Refill patient's Zoloft request. -  sertraline (ZOLOFT) 50 MG tablet; Take 1.5 tablets (75 mg total) by mouth at bedtime.  Dispense: 45 tablet; Refill: 3  4. Primary insomnia Refill patient is Lunesta at her request.  She reports sleeping well when using - eszopiclone (LUNESTA) 2 MG TABS tablet; Take 1 tablet (2 mg total) by mouth at bedtime.  Dispense: 30 tablet; Refill: 3  General Counseling: Vearl verbalizes understanding of the findings of todays visit and agrees with plan of treatment. I have discussed any further diagnostic evaluation that may be needed or ordered today. We also reviewed her medications today. she has been encouraged to call the office with any questions or concerns that should arise related to todays visit.    Orders Placed This Encounter  Procedures  . POCT Urine Drug Screen    Meds ordered this encounter  Medications  . sertraline (ZOLOFT) 50 MG tablet    Sig: Take 1.5 tablets (75 mg total) by mouth at bedtime.    Dispense:  45 tablet    Refill:  3  . eszopiclone (LUNESTA) 2 MG TABS tablet    Sig: Take 1 tablet (2 mg total) by mouth at bedtime.    Dispense:  30 tablet    Refill:  3  . amphetamine-dextroamphetamine (ADDERALL XR) 30 MG 24 hr capsule    Sig: Take 1 capsule (30 mg total) by mouth daily.    Dispense:  30 capsule    Refill:  0    Fill after 09/22/2017  . amphetamine-dextroamphetamine (ADDERALL XR) 30 MG 24 hr capsule    Sig: Take 1 capsule (30 mg total) by mouth every morning.    Dispense:  30 capsule    Refill:  0    Do not fill before  03/24/2018    Time spent:25 Minutes   This patient was seen by Orson Gear AGNP-C in Collaboration with Dr Lavera Guise as a part of collaborative care agreement     Kendell Bane AGNP-C Internal medicine

## 2018-02-24 ENCOUNTER — Other Ambulatory Visit: Payer: Self-pay | Admitting: Podiatry

## 2018-02-24 MED ORDER — CEPHALEXIN 500 MG PO CAPS: 500.0000 mg | ORAL_CAPSULE | Freq: Three times a day (TID) | ORAL | 0 refills | Status: DC

## 2018-02-24 MED ORDER — HYDROMORPHONE HCL 4 MG PO TABS: 4.0000 mg | ORAL_TABLET | Freq: Four times a day (QID) | ORAL | 0 refills | Status: AC | PRN

## 2018-02-24 MED ORDER — ONDANSETRON HCL 4 MG PO TABS: 4.0000 mg | ORAL_TABLET | Freq: Three times a day (TID) | ORAL | 0 refills | Status: DC | PRN

## 2018-02-26 ENCOUNTER — Encounter: Payer: Self-pay | Admitting: Podiatry

## 2018-02-26 DIAGNOSIS — M21541 Acquired clubfoot, right foot: Secondary | ICD-10-CM | POA: Diagnosis not present

## 2018-02-26 DIAGNOSIS — M2011 Hallux valgus (acquired), right foot: Secondary | ICD-10-CM | POA: Diagnosis not present

## 2018-02-26 DIAGNOSIS — M216X1 Other acquired deformities of right foot: Secondary | ICD-10-CM | POA: Diagnosis not present

## 2018-03-02 ENCOUNTER — Other Ambulatory Visit: Payer: Self-pay | Admitting: Podiatry

## 2018-03-02 ENCOUNTER — Ambulatory Visit (INDEPENDENT_AMBULATORY_CARE_PROVIDER_SITE_OTHER): Payer: BC Managed Care – PPO | Admitting: Podiatry

## 2018-03-02 ENCOUNTER — Ambulatory Visit (INDEPENDENT_AMBULATORY_CARE_PROVIDER_SITE_OTHER): Payer: BC Managed Care – PPO

## 2018-03-02 ENCOUNTER — Encounter: Payer: Self-pay | Admitting: Podiatry

## 2018-03-02 ENCOUNTER — Telehealth: Payer: Self-pay | Admitting: *Deleted

## 2018-03-02 VITALS — BP 137/84 | HR 74 | Temp 98.1°F

## 2018-03-02 DIAGNOSIS — M2011 Hallux valgus (acquired), right foot: Secondary | ICD-10-CM | POA: Diagnosis not present

## 2018-03-02 DIAGNOSIS — Z9889 Other specified postprocedural states: Secondary | ICD-10-CM

## 2018-03-02 DIAGNOSIS — M7751 Other enthesopathy of right foot: Secondary | ICD-10-CM

## 2018-03-02 NOTE — Telephone Encounter (Signed)
"  I'm calling to verify that the patient had surgery on the 21 or 22.  We received her paperwork but it was conflicting."  She had surgery on 02/26/2018.  "Does she have a follow-up appointment scheduled for today?"  Yes, she has an appointment today.

## 2018-03-03 ENCOUNTER — Telehealth: Payer: Self-pay | Admitting: Podiatry

## 2018-03-03 ENCOUNTER — Telehealth: Payer: Self-pay | Admitting: *Deleted

## 2018-03-03 NOTE — Progress Notes (Signed)
   Subjective:  Patient presents today status post Akin bunionectomy and 2nd metatarsal osteotomy right. DOS: 02/26/18. She reports throbbing pain. She has been taking Ibuprofen for the pain because she states the Dilaudid causes itching. There are no modifying factors noted. Patient is here for further evaluation and treatment.    Past Medical History:  Diagnosis Date  . Anxiety 08/29/10  . BV (bacterial vaginosis) 08/29/10  . Complication of anesthesia 2004   vomited  . Gross hematuria 07/08/2015  . H/O bladder infections   . H/O mumps   . H/O toxoplasmosis   . H/O varicella   . History of kidney stones   . Infections of kidney    several kidney stones, bladder infections  . Kidney disease 09/03/2011   Left ureteral calculus with partial obstruction, passed 06/09/2014. CT did show perinephric stranding.   . Menometrorrhagia 06/26/10  . Plantar fasciitis   . PMS (premenstrual syndrome) 07/10/11  . Post - coital bleeding 08/29/10  . Renal calculus, left   . Sinus infection   . UTI (lower urinary tract infection) 07/08/2015  . Yeast infection       Objective/Physical Exam Neurovascular status intact.  Skin incisions appear to be well coapted with sutures and staples intact. No sign of infectious process noted. No dehiscence. No active bleeding noted. Moderate edema noted to the surgical extremity.  Radiographic Exam:  Orthopedic hardware and osteotomies sites appear to be stable with routine healing.  Assessment: 1. s/p Akin bunionectomy and 2nd metatarsal osteotomy right. DOS: 02/26/18   Plan of Care:  1. Patient was evaluated. X-rays reviewed 2. Dressing changed. Keep clean, dry and intact for one week.  3. Continue weightbearing in CAM boot.  4. Return to clinic in one week with Dr. Milinda Pointer.    Edrick Kins, DPM [facility]  Dr. Edrick Kins, [address]                                        [place], [place] [address]                Office  [phone]  Fax [phone]

## 2018-03-03 NOTE — Telephone Encounter (Signed)
Dr. Prudence Davidson states he is the doctor on-call and pt called complaining of headache.

## 2018-03-03 NOTE — Telephone Encounter (Signed)
Left message informing pt Dr. Prudence Davidson had informed me she had called yesterday complaining of a headache and to call if there was anything I could do.

## 2018-03-03 NOTE — Telephone Encounter (Signed)
This patient called at 6:16 pm 0n 11/26.  She said she was experiencing chills and her head was hurting.  She had surgery by Dr.  Milinda Pointer last Friday.  She was seen by Dr.  Amalia Hailey who checked her post os exam and he stated she was looking well with minimal drainage which he said was normal.  She says the drainage was clear.  I told her to to take medicine -acetominophin- to help control her fever.  She stated she has no foot pain at the site of surgery.  I called Dr. Milinda Pointer who said she had a sinus infection prior to surgery.  Marland Kitchen  He says the fever may be related to that sinus infection.  Patient was told to call if this condition worsens and/or call the office tomorrow.  Gardiner Barefoot DPM

## 2018-03-03 NOTE — Telephone Encounter (Signed)
Pt states she had a fever 101.0, and a headache after her appt yesterday, and called yesterday evening. I asked the pt is she and any other symptoms. Pt states she does have some sinus drainage, and a cough when she gets up from the recliner from the drainage. I asked pt if she had chest pain and she denied. Pt states she doesn't feel it is related to the foot, may have done too much yesterday, and hasn't had any fever today. I told pt to call and if she did have any change of symptoms or if she had fever, redness, swelling, red streaks or drainage call the office.

## 2018-03-10 ENCOUNTER — Encounter: Payer: Self-pay | Admitting: Podiatry

## 2018-03-10 ENCOUNTER — Ambulatory Visit (INDEPENDENT_AMBULATORY_CARE_PROVIDER_SITE_OTHER): Payer: BC Managed Care – PPO | Admitting: Podiatry

## 2018-03-10 DIAGNOSIS — M2011 Hallux valgus (acquired), right foot: Secondary | ICD-10-CM

## 2018-03-10 DIAGNOSIS — Z9889 Other specified postprocedural states: Secondary | ICD-10-CM

## 2018-03-10 DIAGNOSIS — M216X1 Other acquired deformities of right foot: Secondary | ICD-10-CM

## 2018-03-10 NOTE — Progress Notes (Signed)
She presents today date of surgery 02/26/2018 status post Aiken osteotomy and a McBride bunionectomy.  States that she is doing quite well with a second metatarsal osteotomy as well states that it hurts some particular in the big toe but everything else seems to be doing pretty well.  She denies fever chills nausea vomitus aches pain states that she is doing better.  Objective: Vital signs are stable alert and oriented x3 there is no erythema edema cellulitis drainage or odor was dry sterile dressing was removed.  Sutures are intact margins well coapted.  Assessment: Good range of motion of the first metatarsophalangeal joint second metatarsal phalangeal joint.  Plan: Discussed etiology pathology and surgical therapies at this point time went ahead and redressed the foot today after removing sutures.  Follow-up with her in couple weeks.  Asked her to continue the cam walker.

## 2018-03-29 ENCOUNTER — Ambulatory Visit (INDEPENDENT_AMBULATORY_CARE_PROVIDER_SITE_OTHER): Payer: BC Managed Care – PPO | Admitting: Podiatry

## 2018-03-29 ENCOUNTER — Ambulatory Visit (INDEPENDENT_AMBULATORY_CARE_PROVIDER_SITE_OTHER): Payer: BC Managed Care – PPO

## 2018-03-29 VITALS — BP 123/79 | HR 88 | Temp 98.2°F | Resp 16

## 2018-03-29 DIAGNOSIS — Z9889 Other specified postprocedural states: Secondary | ICD-10-CM

## 2018-03-29 DIAGNOSIS — M2011 Hallux valgus (acquired), right foot: Secondary | ICD-10-CM

## 2018-03-29 MED ORDER — TRAMADOL HCL 50 MG PO TABS: 50.0000 mg | ORAL_TABLET | Freq: Three times a day (TID) | ORAL | 1 refills | Status: DC | PRN

## 2018-03-29 NOTE — Progress Notes (Signed)
She presents today date of surgery 02/26/2018 status post Akin osteotomy and Rogue Jury bunionectomy with a second metatarsal osteotomy.  She states that her big toe feels like there is something sticking in it and the rest of the foot feels like there is something moving in it.  She has been taking ibuprofen and using her cam boot.  She denies fever chills nausea vomiting muscle aches and pains.  She does relate coming over on top of her big toe with her boot.  States that it was very sore.  Objective: Vital signs are stable alert and oriented x3.  Pulses are palpable.  Neurologic sensorium is intact.  DP reflexes are intact.  Strength is normal symmetrical.  Dry sterile dressing is removed demonstrating minimal edema no erythema.  She has good range of motion of the first metatarsophalangeal joint of the second toe is sitting rectus.  There is minimal palpation of this area.  Radiographs taken today demonstrate slow healing Aiken osteotomy with what appears to have been some motion of the internal fixation of the proximal phalanx.  The second toe is in good position with double screw fixation.  Assessment well-healing surgical foot possible delay in healing secondary to injury.  Plan: Encourage range of motion exercises place her in a Darco shoe and will follow-up with her in about 3 to 4 weeks.  X-rays will be needed at that point.

## 2018-04-19 ENCOUNTER — Ambulatory Visit (INDEPENDENT_AMBULATORY_CARE_PROVIDER_SITE_OTHER): Payer: BC Managed Care – PPO

## 2018-04-19 ENCOUNTER — Encounter: Payer: Self-pay | Admitting: Podiatry

## 2018-04-19 ENCOUNTER — Telehealth: Payer: Self-pay | Admitting: Podiatry

## 2018-04-19 ENCOUNTER — Ambulatory Visit (INDEPENDENT_AMBULATORY_CARE_PROVIDER_SITE_OTHER): Payer: BC Managed Care – PPO | Admitting: Podiatry

## 2018-04-19 DIAGNOSIS — Z9889 Other specified postprocedural states: Secondary | ICD-10-CM

## 2018-04-19 DIAGNOSIS — M2011 Hallux valgus (acquired), right foot: Secondary | ICD-10-CM

## 2018-04-19 NOTE — Telephone Encounter (Signed)
I returned call to patient, informed her that per Dr. Marisa Cyphers instructions, she can continue to wear her post op shoe and try on shoes every day to see if comfortable.  I instructed her that she can continue wearing post op shoe while working as well.  She verbalized understanding.

## 2018-04-19 NOTE — Telephone Encounter (Signed)
Dr. Milinda Pointer is releasing me to go back to work next week from surgery. He told me I can start trying to wear my tennis shoes. I cannot put my foot into the tennis shoe and wanted to know what he would like me to do.

## 2018-04-19 NOTE — Progress Notes (Signed)
She.  Presents today for postop visit date of surgery 02/26/2018 status post Akin osteotomy and a Rogue Jury bunionectomy and second metatarsal osteotomy.  States my big toe is still little tender and stiff and numb at the tip of the second metatarsal is doing great and second toe sticks up just a little bit.  Objective: Vital signs are stable alert and oriented x3.  Pulses are palpable.  There is no erythema cellulitis drainage or odor just mild edema at the surgical sites.  Scars are thickened and limiting range of motion.  Assessment: Well-healing surgical foot.  Plan: Follow-up with me in 1 month I will allow her to get back to work.  I am also will send her to physical therapy to help increase range of motion and to break down scar tissue.

## 2018-05-24 ENCOUNTER — Ambulatory Visit: Payer: BC Managed Care – PPO | Admitting: Adult Health

## 2018-05-24 ENCOUNTER — Encounter: Payer: Self-pay | Admitting: Adult Health

## 2018-05-24 VITALS — BP 134/88 | HR 97 | Temp 99.2°F | Resp 16 | Ht 63.0 in | Wt 158.0 lb

## 2018-05-24 DIAGNOSIS — J301 Allergic rhinitis due to pollen: Secondary | ICD-10-CM

## 2018-05-24 DIAGNOSIS — J0110 Acute frontal sinusitis, unspecified: Secondary | ICD-10-CM | POA: Diagnosis not present

## 2018-05-24 DIAGNOSIS — R41840 Attention and concentration deficit: Secondary | ICD-10-CM | POA: Diagnosis not present

## 2018-05-24 MED ORDER — AMPHETAMINE-DEXTROAMPHET ER 30 MG PO CP24: 30.0000 mg | ORAL_CAPSULE | ORAL | 0 refills | Status: DC

## 2018-05-24 MED ORDER — AMOXICILLIN-POT CLAVULANATE 875-125 MG PO TABS: 1.0000 | ORAL_TABLET | Freq: Two times a day (BID) | ORAL | 0 refills | Status: DC

## 2018-05-24 NOTE — Patient Instructions (Signed)

## 2018-05-24 NOTE — Progress Notes (Signed)
[facility] [place], [place] [address]  Internal MEDICINE  Office Visit Note  Patient Name: Kayla Jimenez  [id number]  [id number]  Date of Service: 08/10/2018  Chief Complaint  Patient presents with  . ADD    medication refills   . Sinusitis  . Ear Pain    HPI  Pt is here for ADD med refills.  She also reports she has been sick intermittently for the last 3 weeks.  She reports sinus pain and pressure, mild cough, ear pain/pressure.  She reports sore throat in the morning, that gets better through-out the day. 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  However, she denies any body aches or feeling fatigue.  She mostly complains of pressure behind her right eye, and ear.     Current Medication: Outpatient Encounter Medications as of 05/24/2018  Medication Sig  . eszopiclone (LUNESTA) 2 MG TABS tablet Take 1 tablet (2 mg total) by mouth at bedtime.  . Triamcinolone Acetonide (NASACORT ALLERGY 24HR NA) Place into the nose.  . [DISCONTINUED] amphetamine-dextroamphetamine (ADDERALL XR) 30 MG 24 hr capsule Take 1 capsule (30 mg total) by mouth every morning. (Patient not taking: Reported on 06/28/2018)  . [DISCONTINUED] montelukast (SINGULAIR) 10 MG tablet Take 1 tablet (10 mg total) by mouth at bedtime.  . [DISCONTINUED] Probiotic Product ([place]) Take by mouth daily.  . [DISCONTINUED] sertraline (ZOLOFT) 50 MG tablet Take 1.5 tablets (75 mg total) by mouth at bedtime.  Marland Kitchen amphetamine-dextroamphetamine (ADDERALL XR) 30 MG 24 hr capsule Take 1 capsule (30 mg total) by mouth every morning.  Marland Kitchen amphetamine-dextroamphetamine (ADDERALL XR) 30 MG 24 hr capsule Take 1 capsule (30 mg total) by mouth every morning.  . [DISCONTINUED] amoxicillin-clavulanate (AUGMENTIN) 875-125 MG tablet Take 1 tablet by mouth 2 (two) times daily. (Patient not taking: Reported on 06/28/2018)  . [DISCONTINUED]  amphetamine-dextroamphetamine (ADDERALL XR) 30 MG 24 hr capsule Take 1 capsule (30 mg total) by mouth daily.  . [DISCONTINUED] amphetamine-dextroamphetamine (ADDERALL XR) 30 MG 24 hr capsule Take 1 capsule (30 mg total) by mouth every morning. (Patient not taking: Reported on 06/28/2018)  . [DISCONTINUED] ibuprofen (ADVIL,MOTRIN) 600 MG tablet 1  po pc every 6 hours for 5 days then as needed for pain (Patient not taking: Reported on 05/24/2018)  . [DISCONTINUED] ondansetron (ZOFRAN) 4 MG tablet Take 1 tablet (4 mg total) by mouth every 8 (eight) hours as needed for nausea or vomiting. (Patient not taking: Reported on 05/24/2018)  . [DISCONTINUED] phenazopyridine (PYRIDIUM) 200 MG tablet Take 1 tablet (200 mg total) by mouth 3 (three) times daily as needed for pain. (Patient not taking: Reported on 05/24/2018)  . [DISCONTINUED] traMADol (ULTRAM) 50 MG tablet Take 1 tablet (50 mg total) by mouth every 8 (eight) hours as needed. (Patient not taking: Reported on 05/24/2018)   No facility-administered encounter medications on file as of 05/24/2018.     Surgical History: Past Surgical History:  Procedure Laterality Date  . ABDOMINAL HYSTERECTOMY    . ANTERIOR AND POSTERIOR REPAIR  03/16/2012   Procedure: ANTERIOR (CYSTOCELE) AND POSTERIOR REPAIR (RECTOCELE);  Surgeon: [name], MD;  Location: [facility];  Service: Gynecology;  Laterality: N/A;  anterior repair/cysto  . BLADDER SUSPENSION  03/16/2012   Procedure: TRANSVAGINAL TAPE (TVT) PROCEDURE;  Surgeon: [name], MD;  Location: [facility];  Service: Gynecology;  Laterality: N/A;  . BREAST REDUCTION SURGERY  6/04  .  CHOLECYSTECTOMY  06/28/14  . CYSTOSCOPY  03/16/2012   Procedure: CYSTOSCOPY;  Surgeon: [name], MD;  Location: [facility];  Service: Gynecology;  Laterality: N/A;  . CYSTOSCOPY  03/16/2017   Procedure: CYSTOSCOPY;  Surgeon: Everett Graff, MD;  Location: [facility];  Service: Gynecology;;  . Murrell Redden & CURRETTAGE/HYSTROSCOPY WITH  NOVASURE ABLATION  03/16/2012   Procedure: DILATATION & CURETTAGE/HYSTEROSCOPY WITH NOVASURE ABLATION;  Surgeon: [name], MD;  Location: [facility];  Service: Gynecology;  Laterality: N/A;  . TONSILLECTOMY AND ADENOIDECTOMY    . VAGINAL HYSTERECTOMY Bilateral 03/16/2017   Procedure: HYSTERECTOMY VAGINAL Bilateral Salpingectomy ;  Surgeon: Everett Graff, MD;  Location: [facility];  Service: Gynecology;  Laterality: Bilateral;    Medical History: Past Medical History:  Diagnosis Date  . Anxiety 08/29/10  . BV (bacterial vaginosis) 08/29/10  . Complication of anesthesia 2004   vomited  . Gross hematuria 07/08/2015  . H/O bladder infections   . H/O mumps   . H/O toxoplasmosis   . H/O varicella   . History of kidney stones   . Infections of kidney    several kidney stones, bladder infections  . Kidney disease 09/03/2011   Left ureteral calculus with partial obstruction, passed 06/09/2014. CT did show perinephric stranding.   . Menometrorrhagia 06/26/10  . Plantar fasciitis   . PMS (premenstrual syndrome) 07/10/11  . Post - coital bleeding 08/29/10  . Renal calculus, left   . Sinus infection   . UTI (lower urinary tract infection) 07/08/2015  . Yeast infection     Family History: Family History  Problem Relation Age of Onset  . Cancer Other        breast  . Cancer Mother        cervical  . Depression Mother   . Drug abuse Mother   . Anemia Mother        blood transfusion  . Migraines Mother     Social History   Socioeconomic History  . Marital status: Married    Spouse name: Not on file  . Number of children: Not on file  . Years of education: Not on file  . Highest education level: Not on file  Occupational History  . Not on file  Social Needs  . Financial resource strain: Not on file  . Food insecurity:    Worry: Not on file    Inability: Not on file  . Transportation needs:    Medical: Not on file    Non-medical: Not on file  Tobacco Use  . Smoking status:  Never Smoker  . Smokeless tobacco: Never Used  Substance and Sexual Activity  . Alcohol use: No  . Drug use: No  . Sexual activity: Yes    Birth control/protection: Surgical    Comment: pt spouse had vasec.   Lifestyle  . Physical activity:    Days per week: Not on file    Minutes per session: Not on file  . Stress: Not on file  Relationships  . Social connections:    Talks on phone: Not on file    Gets together: Not on file    Attends religious service: Not on file    Active member of club or organization: Not on file    Attends meetings of clubs or organizations: Not on file    Relationship status: Not on file  . Intimate partner violence:    Fear of current or ex partner: Not on file    Emotionally abused: Not on  file    Physically abused: Not on file    Forced sexual activity: Not on file  Other Topics Concern  . Not on file  Social History Narrative  . Not on file      Review of Systems  Constitutional: Negative for chills, fatigue and unexpected weight change.  HENT: Positive for postnasal drip, sinus pressure, sinus pain and sore throat. Negative for congestion, rhinorrhea and sneezing.   Eyes: Negative for photophobia, pain and redness.  Respiratory: Positive for cough. Negative for chest tightness and shortness of breath.   Cardiovascular: Negative for chest pain and palpitations.  Gastrointestinal: Negative for abdominal pain, constipation, diarrhea, nausea and vomiting.  Endocrine: Negative.   Genitourinary: Negative for dysuria and frequency.  Musculoskeletal: Negative for arthralgias, back pain, joint swelling and neck pain.  Skin: Negative for rash.  Allergic/Immunologic: Negative.   Neurological: Negative for tremors and numbness.  Hematological: Negative for adenopathy. Does not bruise/bleed easily.  Psychiatric/Behavioral: Negative for behavioral problems and sleep disturbance. The patient is not nervous/anxious.     Vital Signs: BP 134/88   Pulse  97   Temp 99.2 F (37.3 C)   Resp 16   Ht 5\' 3"  (1.6 m)   Wt 158 lb (71.7 kg)   SpO2 98%   BMI 27.99 kg/m    Physical Exam Vitals signs and nursing note reviewed.  Constitutional:      General: She is not in acute distress.    Appearance: She is well-developed. She is not diaphoretic.  HENT:     Head: Normocephalic and atraumatic.     Mouth/Throat:     Pharynx: No oropharyngeal exudate.  Eyes:     Pupils: Pupils are equal, round, and reactive to light.  Neck:     Musculoskeletal: Normal range of motion and neck supple.     Thyroid: No thyromegaly.     Vascular: No JVD.     Trachea: No tracheal deviation.  Cardiovascular:     Rate and Rhythm: Normal rate and regular rhythm.     Heart sounds: Normal heart sounds. No murmur. No friction rub. No gallop.   Pulmonary:     Effort: Pulmonary effort is normal. No respiratory distress.     Breath sounds: Normal breath sounds. No wheezing or rales.  Chest:     Chest wall: No tenderness.  Abdominal:     Palpations: Abdomen is soft.     Tenderness: There is no abdominal tenderness. There is no guarding.  Musculoskeletal: Normal range of motion.  Lymphadenopathy:     Cervical: No cervical adenopathy.  Skin:    General: Skin is warm and dry.  Neurological:     Mental Status: She is alert and oriented to person, place, and time.     Cranial Nerves: No cranial nerve deficit.  Psychiatric:        Behavior: Behavior normal.        Thought Content: Thought content normal.        Judgment: Judgment normal.    Assessment/Plan: 1. Acute non-recurrent frontal sinusitis Take Augmentin as directed. Advised patient to take entire course of antibiotics as prescribed with food. Pt should return to clinic in 7-10 days if symptoms fail to improve or new symptoms develop.   2. Attention and concentration deficit Refilled Controlled medications today. Reviewed risks and possible side effects associated with taking Stimulants. Combination of  these drugs with other psychotropic medications could cause dizziness and drowsiness. Pt needs to Monitor symptoms and exercise caution  in driving and operating heavy machinery to avoid damages to oneself, to others and to the surroundings. Patient verbalized understanding in this matter. Dependence and abuse for these drugs will be monitored closely. A Controlled substance policy and procedure is on file which allows [facility] to order a urine drug screen test at any visit. Patient understands and agrees with the plan.. - amphetamine-dextroamphetamine (ADDERALL XR) 30 MG 24 hr capsule; Take 1 capsule (30 mg total) by mouth every morning.  Dispense: 30 capsule; Refill: 0 - amphetamine-dextroamphetamine (ADDERALL XR) 30 MG 24 hr capsule; Take 1 capsule (30 mg total) by mouth every morning.  Dispense: 30 capsule; Refill: 0  3. Allergic rhinitis due to pollen, unspecified seasonality Use Flonase and OTC allergy medications as directed.   General Counseling: Annalyse verbalizes understanding of the findings of todays visit and agrees with plan of treatment. I have discussed any further diagnostic evaluation that may be needed or ordered today. We also reviewed her medications today. she has been encouraged to call the office with any questions or concerns that should arise related to todays visit.    No orders of the defined types were placed in this encounter.   Meds ordered this encounter  Medications  . DISCONTD: amphetamine-dextroamphetamine (ADDERALL XR) 30 MG 24 hr capsule    Sig: Take 1 capsule (30 mg total) by mouth every morning.    Dispense:  30 capsule    Refill:  0  . amphetamine-dextroamphetamine (ADDERALL XR) 30 MG 24 hr capsule    Sig: Take 1 capsule (30 mg total) by mouth every morning.    Dispense:  30 capsule    Refill:  0    Do not fill before 06/23/2018  . amphetamine-dextroamphetamine (ADDERALL XR) 30 MG 24 hr capsule    Sig: Take 1 capsule (30 mg total) by mouth  every morning.    Dispense:  30 capsule    Refill:  0    Do not fill before 07/23/2018  . DISCONTD: amoxicillin-clavulanate (AUGMENTIN) 875-125 MG tablet    Sig: Take 1 tablet by mouth 2 (two) times daily.    Dispense:  14 tablet    Refill:  0    Time spent: 25 Minutes   This patient was seen by Orson Gear AGNP-C in Collaboration with Dr Lavera Guise as a part of collaborative care agreement     Kendell Bane AGNP-C Internal medicine

## 2018-06-28 ENCOUNTER — Other Ambulatory Visit: Payer: Self-pay

## 2018-06-28 ENCOUNTER — Encounter: Payer: Self-pay | Admitting: Nurse Practitioner

## 2018-06-28 ENCOUNTER — Ambulatory Visit: Payer: BC Managed Care – PPO | Admitting: Nurse Practitioner

## 2018-06-28 VITALS — BP 146/90 | HR 76 | Temp 97.8°F | Resp 16 | Ht 63.0 in | Wt 158.0 lb

## 2018-06-28 DIAGNOSIS — N390 Urinary tract infection, site not specified: Secondary | ICD-10-CM

## 2018-06-28 DIAGNOSIS — N200 Calculus of kidney: Secondary | ICD-10-CM | POA: Diagnosis not present

## 2018-06-28 DIAGNOSIS — R319 Hematuria, unspecified: Secondary | ICD-10-CM | POA: Diagnosis not present

## 2018-06-28 DIAGNOSIS — R300 Dysuria: Secondary | ICD-10-CM | POA: Diagnosis not present

## 2018-06-28 DIAGNOSIS — R109 Unspecified abdominal pain: Secondary | ICD-10-CM

## 2018-06-28 DIAGNOSIS — F321 Major depressive disorder, single episode, moderate: Secondary | ICD-10-CM

## 2018-06-28 LAB — POCT URINALYSIS DIPSTICK
Bilirubin, UA: NEGATIVE
Glucose, UA: NEGATIVE
Ketones, UA: NEGATIVE
LEUKOCYTES UA: NEGATIVE
NITRITE UA: NEGATIVE
Protein, UA: POSITIVE — AB
Spec Grav, UA: >=1.03 — AB (ref 1.010–1.025)
Urobilinogen, UA: 0.2 E.U./dL
pH, UA: 6 (ref 5.0–8.0)

## 2018-06-28 MED ORDER — CIPROFLOXACIN HCL 500 MG PO TABS: 500.0000 mg | ORAL_TABLET | Freq: Two times a day (BID) | ORAL | 0 refills | Status: DC

## 2018-06-28 MED ORDER — HYDROCODONE-ACETAMINOPHEN 5-325 MG PO TABS: 1.0000 | ORAL_TABLET | Freq: Four times a day (QID) | ORAL | 0 refills | Status: DC | PRN

## 2018-06-28 MED ORDER — SERTRALINE HCL 50 MG PO TABS: 75.0000 mg | ORAL_TABLET | Freq: Every day | ORAL | 3 refills | Status: DC

## 2018-06-28 NOTE — Telephone Encounter (Signed)
Pt called stating that her rx was not at the pharmacy called pharmacy and the pharmacy confirmed that they have the rx for hydrocodone and verbal orders to fill the zoloft.

## 2018-06-28 NOTE — Progress Notes (Signed)
Pt blood pressure elevated

## 2018-06-28 NOTE — Progress Notes (Signed)
[facility] [place], [place] [address]  Internal MEDICINE  Office Visit Note  Patient Name: Kayla Jimenez  [id number]  [id number]  Date of Service: 06/28/2018   Pt is here for a sick visit.   Chief Complaint  Patient presents with  . Pain  . Urinary Tract Infection    pt is having right flank pain, pressure, sometimes feels like little needles, frequencey but dont urinate alot  and when she goes to the bathroom she feels like she have to go as soon as she finish., no burning or itching,      Patient having right sided flank pain. Started last week, but got very bad this past Friday.dose have some dysuria. Has had bladder pain and spasms. Did not want to got to ER due to risk of exposure to COVID 18. Pain is getting considerably worse. Does have urologist, but has not had a stone in over two years. Would not be able to be seen by urologist in April       Current Medication:  Outpatient Encounter Medications as of 06/28/2018  Medication Sig  . amphetamine-dextroamphetamine (ADDERALL XR) 30 MG 24 hr capsule Take 1 capsule (30 mg total) by mouth every morning.  Derrill Memo ON 07/23/2018] amphetamine-dextroamphetamine (ADDERALL XR) 30 MG 24 hr capsule Take 1 capsule (30 mg total) by mouth every morning.  . eszopiclone (LUNESTA) 2 MG TABS tablet Take 1 tablet (2 mg total) by mouth at bedtime.  . montelukast (SINGULAIR) 10 MG tablet Take 1 tablet (10 mg total) by mouth at bedtime.  . Triamcinolone Acetonide (NASACORT ALLERGY 24HR NA) Place into the nose.  . [DISCONTINUED] sertraline (ZOLOFT) 50 MG tablet Take 1.5 tablets (75 mg total) by mouth at bedtime.  . ciprofloxacin (CIPRO) 500 MG tablet Take 1 tablet (500 mg total) by mouth 2 (two) times daily.  Marland Kitchen HYDROcodone-acetaminophen (NORCO/VICODIN) 5-325 MG tablet Take 1 tablet by mouth every 6 (six) hours as needed for moderate pain.  . [DISCONTINUED] amoxicillin-clavulanate (AUGMENTIN) 875-125 MG tablet Take 1  tablet by mouth 2 (two) times daily. (Patient not taking: Reported on 06/28/2018)  . [DISCONTINUED] amphetamine-dextroamphetamine (ADDERALL XR) 30 MG 24 hr capsule Take 1 capsule (30 mg total) by mouth every morning. (Patient not taking: Reported on 06/28/2018)  . [DISCONTINUED] amphetamine-dextroamphetamine (ADDERALL XR) 30 MG 24 hr capsule Take 1 capsule (30 mg total) by mouth every morning. (Patient not taking: Reported on 06/28/2018)  . [DISCONTINUED] Probiotic Product ([place]) Take by mouth daily.   No facility-administered encounter medications on file as of 06/28/2018.       Medical History: Past Medical History:  Diagnosis Date  . Anxiety 08/29/10  . BV (bacterial vaginosis) 08/29/10  . Complication of anesthesia 2004   vomited  . Gross hematuria 07/08/2015  . H/O bladder infections   . H/O mumps   . H/O toxoplasmosis   . H/O varicella   . History of kidney stones   . Infections of kidney    several kidney stones, bladder infections  . Kidney disease 09/03/2011   Left ureteral calculus with partial obstruction, passed 06/09/2014. CT did show perinephric stranding.   . Menometrorrhagia 06/26/10  . Plantar fasciitis   . PMS (premenstrual syndrome) 07/10/11  . Post - coital bleeding 08/29/10  . Renal calculus, left   . Sinus infection   . UTI (lower urinary tract infection) 07/08/2015  . Yeast infection      Today's Vitals   06/28/18 1014  BP: (!) 146/90  Pulse: 76  Resp: 16  Temp: 97.8 F (36.6 C)  SpO2: 99%  Weight: 158 lb (71.7 kg)  Height: 5\' 3"  (1.6 m)   Body mass index is 27.99 kg/m.  Review of Systems  Constitutional: Positive for chills and fatigue. Negative for unexpected weight change.  HENT: Negative for congestion, postnasal drip, rhinorrhea, sneezing and sore throat.   Respiratory: Negative for cough, chest tightness, shortness of breath and wheezing.   Cardiovascular: Negative for chest pain and palpitations.  Gastrointestinal:  Positive for abdominal pain. Negative for constipation, diarrhea, nausea and vomiting.  Genitourinary: Positive for dysuria, flank pain, hematuria and urgency. Negative for frequency.  Musculoskeletal: Positive for back pain. Negative for arthralgias, joint swelling and neck pain.  Skin: Negative for rash.  Neurological: Negative for dizziness, tremors, numbness and headaches.  Hematological: Negative for adenopathy. Does not bruise/bleed easily.  Psychiatric/Behavioral: Negative for behavioral problems (Depression), sleep disturbance and suicidal ideas. The patient is not nervous/anxious.     Physical Exam Genitourinary:    Comments: Urine sample positive for protein and small blood. She has right flank pain with light palpation.    Assessment/Plan:  1. Urinary tract infection with hematuria, site unspecified Start cipro 500mg  bid for 7 days. Send urine for culture and sensitivity and adjust antibiotics as indicated.  - ciprofloxacin (CIPRO) 500 MG tablet; Take 1 tablet (500 mg total) by mouth 2 (two) times daily.  Dispense: 14 tablet; Refill: 0  2. Hematuria, unspecified type Treat UTI. Renal ultrasound ordered to evaluate for stone.  - CULTURE, URINE COMPREHENSIVE - US Renal; Future  3. Renal calculi Treat UTI. Renal ultrasound ordered to evaluate for stone. Short term prescription for hydrocodone/APAP 5/325mg  sent to her pharmacy. May take 1 every 6 hours as needed for severe pain. Reviewed risks and side effects associated with taking narcotic pain medication.  - HYDROcodone-acetaminophen (NORCO/VICODIN) 5-325 MG tablet; Take 1 tablet by mouth every 6 (six) hours as needed for moderate pain.  Dispense: 30 tablet; Refill: 0  4. Right flank pain reat UTI. Renal ultrasound ordered to evaluate for stone. Short term prescription for hydrocodone/APAP 5/325mg  sent to her pharmacy. May take 1 every 6 hours as needed for severe pain. Reviewed risks and side effects associated with taking  narcotic pain medication.  - HYDROcodone-acetaminophen (NORCO/VICODIN) 5-325 MG tablet; Take 1 tablet by mouth every 6 (six) hours as needed for moderate pain.  Dispense: 30 tablet; Refill: 0  5. Dysuria - POCT Urinalysis Dipstick - CULTURE, URINE COMPREHENSIVE  General Counseling: Kayla Jimenez verbalizes understanding of the findings of todays visit and agrees with plan of treatment. I have discussed any further diagnostic evaluation that may be needed or ordered today. We also reviewed her medications today. she has been encouraged to call the office with any questions or concerns that should arise related to todays visit.    Counseling:  Reviewed risks and possible side effects associated with taking opiates, benzodiazepines and other CNS depressants. Combination of these could cause dizziness and drowsiness. Advised patient not to drive or operate machinery when taking these medications, as patient's and other's life can be at risk and will have consequences. Patient verbalized understanding in this matter. Dependence and abuse for these drugs will be monitored closely. A Controlled substance policy and procedure is on file which allows [facility] to order a urine drug screen test at any visit. Patient understands and agrees with the plan  This patient was seen by Leretha Pol FNP Collaboration  with Dr Lavera Guise as a part of collaborative care agreement  Orders Placed This Encounter  Procedures  . CULTURE, URINE COMPREHENSIVE  . US Renal  . POCT Urinalysis Dipstick    Meds ordered this encounter  Medications  . ciprofloxacin (CIPRO) 500 MG tablet    Sig: Take 1 tablet (500 mg total) by mouth 2 (two) times daily.    Dispense:  14 tablet    Refill:  0    Order Specific Question:   Supervising Provider    Answer:   Lavera Guise [[id number]]  . HYDROcodone-acetaminophen (NORCO/VICODIN) 5-325 MG tablet    Sig: Take 1 tablet by mouth every 6 (six) hours as needed for moderate  pain.    Dispense:  30 tablet    Refill:  0    Order Specific Question:   Supervising Provider    Answer:   Lavera Guise [[id number]]    Time spent: 25 Minutes

## 2018-07-01 LAB — CULTURE, URINE COMPREHENSIVE

## 2018-07-02 ENCOUNTER — Other Ambulatory Visit: Payer: BC Managed Care – PPO

## 2018-07-09 ENCOUNTER — Telehealth: Payer: Self-pay

## 2018-07-09 ENCOUNTER — Other Ambulatory Visit: Payer: Self-pay

## 2018-07-09 ENCOUNTER — Ambulatory Visit
Admission: RE | Admit: 2018-07-09 | Discharge: 2018-07-09 | Disposition: A | Discharge status: Home / Self Care | Payer: BC Managed Care – PPO | Source: Ambulatory Visit | Attending: Adult Health | Admitting: Adult Health

## 2018-07-09 ENCOUNTER — Other Ambulatory Visit: Payer: Self-pay | Admitting: Adult Health

## 2018-07-09 DIAGNOSIS — R319 Hematuria, unspecified: Secondary | ICD-10-CM | POA: Diagnosis present

## 2018-07-09 NOTE — Progress Notes (Signed)
CT order corrected for another provider.

## 2018-07-09 NOTE — Telephone Encounter (Signed)
Patient scheduled for stat CT renal study on 07/09/2018 @ 2Pm at [facility]. [place]  Authorization: [id number] valid 07/09/2018-01/04/2019

## 2018-07-20 ENCOUNTER — Telehealth: Payer: Self-pay

## 2018-07-22 NOTE — Telephone Encounter (Signed)
Message sent to Connye Burkitt for further evaluation, recommendation

## 2018-07-23 NOTE — Telephone Encounter (Signed)
I can treat again with antibiotics and flomax until she is seen by urology. Can you cnfirm which pharmacy she uses?

## 2018-07-26 ENCOUNTER — Other Ambulatory Visit: Payer: Self-pay | Admitting: Nurse Practitioner

## 2018-07-26 ENCOUNTER — Other Ambulatory Visit: Payer: Self-pay

## 2018-07-26 DIAGNOSIS — R319 Hematuria, unspecified: Principal | ICD-10-CM

## 2018-07-26 DIAGNOSIS — N390 Urinary tract infection, site not specified: Secondary | ICD-10-CM

## 2018-07-26 DIAGNOSIS — J301 Allergic rhinitis due to pollen: Secondary | ICD-10-CM

## 2018-07-26 MED ORDER — CIPROFLOXACIN HCL 500 MG PO TABS: 500.0000 mg | ORAL_TABLET | Freq: Two times a day (BID) | ORAL | 0 refills | Status: DC

## 2018-07-26 MED ORDER — MONTELUKAST SODIUM 10 MG PO TABS: 10.0000 mg | ORAL_TABLET | Freq: Every day | ORAL | 3 refills | Status: DC

## 2018-07-26 NOTE — Telephone Encounter (Signed)
Sent prescription for ciprofloxacin 500mg  twice daily for 10 days to treat infection until she is able to see urologist. Does she do OK taking flomax? It comes up as possible allergy. If she does ok, I will sent this as well. Thanks.

## 2018-07-26 NOTE — Telephone Encounter (Signed)
It has cross-reactivity with sulfa, so I would not add this. Antibiotics were sent to the pharmacy.

## 2018-07-26 NOTE — Telephone Encounter (Signed)
Pt uses [facility] on [address].

## 2018-07-26 NOTE — Progress Notes (Signed)
Sent prescription for ciprofloxacin 500mg  twice daily for 10 days to treat infection until she is able to see urologist.

## 2018-07-26 NOTE — Telephone Encounter (Signed)
Pt was notified and if the flomax has sulfa (not sure if it does) then she can not take it.

## 2018-08-25 ENCOUNTER — Other Ambulatory Visit: Payer: Self-pay

## 2018-08-25 ENCOUNTER — Ambulatory Visit: Payer: BC Managed Care – PPO | Admitting: Urology

## 2018-08-25 ENCOUNTER — Other Ambulatory Visit: Payer: BC Managed Care – PPO

## 2018-08-25 DIAGNOSIS — N200 Calculus of kidney: Secondary | ICD-10-CM

## 2018-08-25 LAB — URINALYSIS, COMPLETE
Bilirubin, UA: NEGATIVE
Glucose, UA: NEGATIVE
Ketones, UA: NEGATIVE
Leukocytes,UA: NEGATIVE
Nitrite, UA: NEGATIVE
Protein,UA: NEGATIVE
RBC, UA: NEGATIVE
Specific Gravity, UA: 1.02 (ref 1.005–1.030)
Urobilinogen, Ur: 0.2 mg/dL (ref 0.2–1.0)
pH, UA: 5 (ref 5.0–7.5)

## 2018-08-25 LAB — MICROSCOPIC EXAMINATION: RBC, Urine: NONE SEEN /hpf (ref 0–2)

## 2018-08-26 ENCOUNTER — Telehealth (INDEPENDENT_AMBULATORY_CARE_PROVIDER_SITE_OTHER): Payer: BC Managed Care – PPO | Admitting: Urology

## 2018-08-26 DIAGNOSIS — N200 Calculus of kidney: Secondary | ICD-10-CM

## 2018-08-26 MED ORDER — NITROFURANTOIN MONOHYD MACRO 100 MG PO CAPS: 100.0000 mg | ORAL_CAPSULE | Freq: Every day | ORAL | 11 refills | Status: DC | PRN

## 2018-08-26 NOTE — Addendum Note (Signed)
Addended by: Billey Co on: 08/26/2018 01:11 PM   Modules accepted: Orders

## 2018-08-26 NOTE — Progress Notes (Signed)
Virtual Visit via Telephone Note  I connected with Kayla Jimenez on 08/26/18 at 12:30 PM EDT by telephone and verified that I am speaking with the correct person using two identifiers.   I discussed the limitations, risks, security and privacy concerns of performing an evaluation and management service by telephone and the availability of in person appointments. We discussed the impact of the COVID-19 on the healthcare system, and the importance of social distancing and reducing patient and provider exposure. I also discussed with the patient that there may be a patient responsible charge related to this service. The patient expressed understanding and agreed to proceed.  Reason for visit: Right flank pain, now resolved  History of Present Illness: I had a virtual visit with Kayla Jimenez today in consultation from Kayla Jimenez for right-sided flank pain.  Briefly, she is a healthy 43 year old female who had a 6-week history of right-sided flank, groin and abdominal pain as well as dysuria this spring.  She was ultimately treated with antibiotics which resolved her symptoms.  Currently is completely asymptomatic and denies any hematuria or flank pain.  She was previously evaluated for gross hematuria in 2017 by Dr. Phebe Jimenez, and CT scan showed a 3 mm nonobstructive right mid calyceal stone, cystoscopy was negative at that time.  She underwent a repeat CT scan on 07/09/2018 for her right-sided flank pain which showed no hydronephrosis or change in size or position of her non-obstructive right-sided renal stone.  She does not have any prior positive urine cultures from the spring.  She denies any previously episodes of nephrolithiasis or spontaneously passed stones.  She reports she gets 2-3 UTIs per year, with symptoms of pelvic pressure, pain, and burning.  She works as a Education officer, museum and sometimes feels she holds her urine too long.  She also feels these are brought on by sexual activity.  There are no  aggravating or alleviating factors.  Severity is mild.  Urinalysis from yesterday is benign appearing with 0-5 WBCs, 0 RBCs, few bacteria, nitrite negative.  Assessment and Plan: In summary, the patient is a 43 year old female with recurrent UTIs and a stable nonobstructive 3 mm right mid calyceal stone that is asymptomatic.  I suspect her episode of right-sided flank pain that resolved with antibiotics is more consistent with pyelonephritis then renal colic from kidney stone.  We discussed the evaluation and treatment of patients with recurrent UTIs at length.  We specifically discussed the differences between asymptomatic bacteriuria and true urinary tract infection.  We discussed the AUA definition of recurrent UTI of at least 2 culture proven symptomatic acute cystitis episodes in a 66-month period, or 3 within a 1 year period.  We discussed the importance of culture directed antibiotic treatment, and antibiotic stewardship.  First-line therapy includes nitrofurantoin(5 days), Bactrim(3 days), or fosfomycin(3 g single dose).  Possible etiologies of recurrent infection include periurethral tissue atrophy in postmenopausal woman, constipation, sexual activity, incomplete emptying, anatomic abnormalities, and even genetic predisposition.  Finally, we discussed the role of perineal hygiene, timed voiding, adequate hydration, topical vaginal estrogen, cranberry prophylaxis, and low-dose antibiotic prophylaxis.  -Recommended trial of cranberry prophylaxis as well as nitrofurantoin 100 mg postcoital antibiotic prophylaxis -Recommended ongoing observation for her small stable non-obstructive right-sided kidney stone.  She would be a good candidate for shockwave lithotripsy if she desired intervention for this in the future  Follow Up: 3 months with Kayla Jimenez   I discussed the assessment and treatment plan with the patient. The patient was provided  an opportunity to ask questions and all were answered. The  patient agreed with the plan and demonstrated an understanding of the instructions.   The patient was advised to call back or seek an in-person evaluation if the symptoms worsen or if the condition fails to improve as anticipated.  I provided 20 minutes of non-face-to-face time during this encounter.   Billey Co, MD

## 2018-08-28 LAB — CULTURE, URINE COMPREHENSIVE

## 2018-09-06 ENCOUNTER — Other Ambulatory Visit: Payer: Self-pay | Admitting: Adult Health

## 2018-09-06 DIAGNOSIS — F5101 Primary insomnia: Secondary | ICD-10-CM

## 2018-09-07 NOTE — Telephone Encounter (Signed)
Pt needs to be seen for new refill

## 2018-10-07 ENCOUNTER — Ambulatory Visit: Payer: BC Managed Care – PPO | Admitting: Nurse Practitioner

## 2018-10-07 ENCOUNTER — Encounter: Payer: Self-pay | Admitting: Nurse Practitioner

## 2018-10-07 ENCOUNTER — Other Ambulatory Visit: Payer: Self-pay | Admitting: Internal Medicine

## 2018-10-07 ENCOUNTER — Other Ambulatory Visit: Payer: Self-pay

## 2018-10-07 VITALS — BP 141/92 | HR 96 | Temp 99.4°F | Resp 16 | Ht 63.0 in | Wt 167.2 lb

## 2018-10-07 DIAGNOSIS — F329 Major depressive disorder, single episode, unspecified: Secondary | ICD-10-CM | POA: Diagnosis not present

## 2018-10-07 DIAGNOSIS — R5383 Other fatigue: Secondary | ICD-10-CM

## 2018-10-07 DIAGNOSIS — F5101 Primary insomnia: Secondary | ICD-10-CM

## 2018-10-07 DIAGNOSIS — F32A Depression, unspecified: Secondary | ICD-10-CM

## 2018-10-07 DIAGNOSIS — F321 Major depressive disorder, single episode, moderate: Secondary | ICD-10-CM | POA: Diagnosis not present

## 2018-10-07 DIAGNOSIS — IMO0002 Reserved for concepts with insufficient information to code with codable children: Secondary | ICD-10-CM | POA: Insufficient documentation

## 2018-10-07 DIAGNOSIS — L659 Nonscarring hair loss, unspecified: Secondary | ICD-10-CM | POA: Insufficient documentation

## 2018-10-07 DIAGNOSIS — D249 Benign neoplasm of unspecified breast: Secondary | ICD-10-CM | POA: Insufficient documentation

## 2018-10-07 MED ORDER — SERTRALINE HCL 50 MG PO TABS: 75.0000 mg | ORAL_TABLET | Freq: Every day | ORAL | 3 refills | Status: DC

## 2018-10-07 MED ORDER — ESZOPICLONE 2 MG PO TABS: 2.0000 mg | ORAL_TABLET | Freq: Every day | ORAL | 3 refills | Status: DC

## 2018-10-07 NOTE — Progress Notes (Signed)
[facility] [place], [place] [address]  Internal MEDICINE  Office Visit Note  Patient Name: Kayla Jimenez  [id number]  [id number]  Date of Service: 10/10/2018  Chief Complaint  Patient presents with  . Medical Management of Chronic Issues    follow up, medication refills  . Anxiety    The patient is here for routine follow up. She admits to having a big increase in levels of depression. She had been out of work for a few months as the end of 2019 and beginning of 2020. She has just started back when schools were closed due to pandemic related to COVID 19. She reports lack of energy and motivation. 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 Lost interest in doing housework, can't go to the gym to exercise. She has gained weight due to this and this worsens her depression. She denies feeling suicidal or crying often. She continues to take zoloft 75mg  daily and takes lunesta to help with sleep. Has not been taking adderall as she has not been working. She needs to have refills for her sertraline and lunesta.       Current Medication: Outpatient Encounter Medications as of 10/07/2018  Medication Sig  . amphetamine-dextroamphetamine (ADDERALL XR) 30 MG 24 hr capsule Take 1 capsule (30 mg total) by mouth every morning.  . montelukast (SINGULAIR) 10 MG tablet Take 1 tablet (10 mg total) by mouth at bedtime.  . nitrofurantoin, macrocrystal-monohydrate, (MACROBID) 100 MG capsule Take 1 capsule (100 mg total) by mouth daily as needed (prior to sexual activity). Take 1 tablet within one hour of sexual activity to prevent UTI  . sertraline (ZOLOFT) 50 MG tablet Take 1.5 tablets (75 mg total) by mouth at bedtime.  . Triamcinolone Acetonide (NASACORT ALLERGY 24HR NA) Place into the nose.  . [DISCONTINUED] eszopiclone (LUNESTA) 2 MG TABS tablet TAKE 1 TABLET (2 MG TOTAL) BY MOUTH AT  BEDTIME.  . [DISCONTINUED] eszopiclone (LUNESTA) 2 MG TABS tablet Take 1 tablet (2 mg total) by mouth at bedtime.  . [DISCONTINUED] sertraline (ZOLOFT) 50 MG tablet Take 1.5 tablets (75 mg total) by mouth at bedtime.  . [DISCONTINUED] amphetamine-dextroamphetamine (ADDERALL XR) 30 MG 24 hr capsule Take 1 capsule (30 mg total) by mouth every morning. (Patient not taking: Reported on 10/07/2018)  . [DISCONTINUED] ciprofloxacin (CIPRO) 500 MG tablet Take 1 tablet (500 mg total) by mouth 2 (two) times daily. (Patient not taking: Reported on 10/07/2018)  . [DISCONTINUED] HYDROcodone-acetaminophen (NORCO/VICODIN) 5-325 MG tablet Take 1 tablet by mouth every 6 (six) hours as needed for moderate pain. (Patient not taking: Reported on 10/07/2018)   No facility-administered encounter medications on file as of 10/07/2018.     Surgical History: Past Surgical History:  Procedure Laterality Date  . ABDOMINAL HYSTERECTOMY    . ANTERIOR AND POSTERIOR REPAIR  03/16/2012   Procedure: ANTERIOR (CYSTOCELE) AND POSTERIOR REPAIR (RECTOCELE);  Surgeon: [name], MD;  Location: [facility];  Service: Gynecology;  Laterality: N/A;  anterior repair/cysto  . BLADDER SUSPENSION  03/16/2012   Procedure: TRANSVAGINAL TAPE (TVT) PROCEDURE;  Surgeon: [name], MD;  Location: [facility];  Service: Gynecology;  Laterality: N/A;  . BREAST REDUCTION SURGERY  6/04  . CHOLECYSTECTOMY  06/28/14  . CYSTOSCOPY  03/16/2012   Procedure: CYSTOSCOPY;  Surgeon: [name], MD;  Location: [facility];  Service: Gynecology;  Laterality: N/A;  .  CYSTOSCOPY  03/16/2017   Procedure: CYSTOSCOPY;  Surgeon: Everett Graff, MD;  Location: [facility];  Service: Gynecology;;  . Murrell Redden & CURRETTAGE/HYSTROSCOPY WITH NOVASURE ABLATION  03/16/2012   Procedure: DILATATION & CURETTAGE/HYSTEROSCOPY WITH NOVASURE ABLATION;  Surgeon: [name], MD;  Location: [facility];  Service: Gynecology;  Laterality: N/A;  . TONSILLECTOMY AND ADENOIDECTOMY    .  VAGINAL HYSTERECTOMY Bilateral 03/16/2017   Procedure: HYSTERECTOMY VAGINAL Bilateral Salpingectomy ;  Surgeon: Everett Graff, MD;  Location: [facility];  Service: Gynecology;  Laterality: Bilateral;    Medical History: Past Medical History:  Diagnosis Date  . Anxiety 08/29/10  . BV (bacterial vaginosis) 08/29/10  . Complication of anesthesia 2004   vomited  . Gross hematuria 07/08/2015  . H/O bladder infections   . H/O mumps   . H/O toxoplasmosis   . H/O varicella   . History of kidney stones   . Infections of kidney    several kidney stones, bladder infections  . Kidney disease 09/03/2011   Left ureteral calculus with partial obstruction, passed 06/09/2014. CT did show perinephric stranding.   . Menometrorrhagia 06/26/10  . Plantar fasciitis   . PMS (premenstrual syndrome) 07/10/11  . Post - coital bleeding 08/29/10  . Renal calculus, left   . Sinus infection   . UTI (lower urinary tract infection) 07/08/2015  . Yeast infection     Family History: Family History  Problem Relation Age of Onset  . Cancer Other        breast  . Cancer Mother        cervical  . Depression Mother   . Drug abuse Mother   . Anemia Mother        blood transfusion  . Migraines Mother     Social History   Socioeconomic History  . Marital status: Married    Spouse name: Not on file  . Number of children: Not on file  . Years of education: Not on file  . Highest education level: Not on file  Occupational History  . Not on file  Social Needs  . Financial resource strain: Not on file  . Food insecurity    Worry: Not on file    Inability: Not on file  . Transportation needs    Medical: Not on file    Non-medical: Not on file  Tobacco Use  . Smoking status: Never Smoker  . Smokeless tobacco: Never Used  Substance and Sexual Activity  . Alcohol use: No  . Drug use: No  . Sexual activity: Yes    Birth control/protection: Surgical    Comment: pt spouse had vasec.   Lifestyle  . Physical  activity    Days per week: Not on file    Minutes per session: Not on file  . Stress: Not on file  Relationships  . Social Herbalist on phone: Not on file    Gets together: Not on file    Attends religious service: Not on file    Active member of club or organization: Not on file    Attends meetings of clubs or organizations: Not on file    Relationship status: Not on file  . Intimate partner violence    Fear of current or ex partner: Not on file    Emotionally abused: Not on file    Physically abused: Not on file    Forced sexual activity: Not on file  Other Topics Concern  . Not on file  Social History  Narrative  . Not on file      Review of Systems  Constitutional: Positive for activity change and fatigue. Negative for chills and unexpected weight change.  HENT: Negative for congestion, postnasal drip, rhinorrhea, sneezing and sore throat.   Respiratory: Negative for cough, chest tightness, shortness of breath and wheezing.   Cardiovascular: Negative for chest pain and palpitations.  Gastrointestinal: Positive for abdominal pain. Negative for constipation, diarrhea, nausea and vomiting.  Endocrine: Negative for cold intolerance, heat intolerance, polydipsia and polyuria.  Musculoskeletal: Negative for arthralgias, back pain, joint swelling and neck pain.  Skin: Negative for rash.  Neurological: Negative for dizziness, tremors, numbness and headaches.  Hematological: Negative for adenopathy. Does not bruise/bleed easily.  Psychiatric/Behavioral: Positive for dysphoric mood and sleep disturbance. Negative for behavioral problems (Depression) and suicidal ideas. The patient is nervous/anxious.     Today's Vitals   10/07/18 1643  BP: (!) 141/92  Pulse: 96  Resp: 16  Temp: 99.4 F (37.4 C)  SpO2: 98%  Weight: 167 lb 3.2 oz (75.8 kg)  Height: 5\' 3"  (1.6 m)   Body mass index is 29.62 kg/m.  Physical Exam Vitals signs and nursing note reviewed.   Constitutional:      General: She is not in acute distress.    Appearance: Normal appearance. She is well-developed. She is not diaphoretic.  HENT:     Head: Normocephalic and atraumatic.     Mouth/Throat:     Pharynx: No oropharyngeal exudate.  Eyes:     Pupils: Pupils are equal, round, and reactive to light.  Neck:     Musculoskeletal: Normal range of motion and neck supple.     Thyroid: No thyromegaly.     Vascular: No JVD.     Trachea: No tracheal deviation.  Cardiovascular:     Rate and Rhythm: Normal rate and regular rhythm.     Heart sounds: Normal heart sounds. No murmur. No friction rub. No gallop.   Pulmonary:     Effort: Pulmonary effort is normal. No respiratory distress.     Breath sounds: Normal breath sounds. No wheezing or rales.  Chest:     Chest wall: No tenderness.  Abdominal:     Palpations: Abdomen is soft.     Tenderness: There is no abdominal tenderness. There is no guarding.  Musculoskeletal: Normal range of motion.  Lymphadenopathy:     Cervical: No cervical adenopathy.  Skin:    General: Skin is warm and dry.  Neurological:     Mental Status: She is alert and oriented to person, place, and time.     Cranial Nerves: No cranial nerve deficit.  Psychiatric:        Behavior: Behavior normal.        Thought Content: Thought content normal.        Judgment: Judgment normal.   Assessment/Plan: 1. Fatigue due to depression Likely due to changing circumstances because of COVID 19 spread. Will continue to monitor.   2. Primary insomnia Continue lunesta as prescribed and as needed. Refills provided today.   3. Moderate major depression (HCC) Continue sertraline 75mg  daily. Refills provided today.  - sertraline (ZOLOFT) 50 MG tablet; Take 1.5 tablets (75 mg total) by mouth at bedtime.  Dispense: 45 tablet; Refill: 3  General Counseling: Breeonna verbalizes understanding of the findings of todays visit and agrees with plan of treatment. I have discussed  any further diagnostic evaluation that may be needed or ordered today. We also reviewed her medications today. she  has been encouraged to call the office with any questions or concerns that should arise related to todays visit.  This patient was seen by [place] with Dr Lavera Guise as a part of collaborative care agreement  Meds ordered this encounter  Medications  . DISCONTD: eszopiclone (LUNESTA) 2 MG TABS tablet    Sig: Take 1 tablet (2 mg total) by mouth at bedtime.    Dispense:  30 tablet    Refill:  3    Order Specific Question:   Supervising Provider    Answer:   Lavera Guise [[id number]]  . sertraline (ZOLOFT) 50 MG tablet    Sig: Take 1.5 tablets (75 mg total) by mouth at bedtime.    Dispense:  45 tablet    Refill:  3    Order Specific Question:   Supervising Provider    Answer:   Lavera Guise [[id number]]    Time spent: 7 Minutes      Dr Lavera Guise Internal medicine

## 2018-10-10 DIAGNOSIS — R5383 Other fatigue: Secondary | ICD-10-CM | POA: Insufficient documentation

## 2018-10-10 DIAGNOSIS — F329 Major depressive disorder, single episode, unspecified: Secondary | ICD-10-CM | POA: Insufficient documentation

## 2018-12-06 ENCOUNTER — Encounter: Payer: Self-pay | Admitting: Urology

## 2018-12-06 ENCOUNTER — Ambulatory Visit: Payer: BC Managed Care – PPO | Admitting: Urology

## 2018-12-06 ENCOUNTER — Other Ambulatory Visit: Payer: Self-pay

## 2018-12-06 ENCOUNTER — Ambulatory Visit
Admission: RE | Admit: 2018-12-06 | Discharge: 2018-12-06 | Disposition: A | Discharge status: Home / Self Care | Payer: BC Managed Care – PPO | Source: Ambulatory Visit | Attending: Urology | Admitting: Urology

## 2018-12-06 VITALS — BP 139/84 | HR 98 | Ht 63.0 in | Wt 166.0 lb

## 2018-12-06 DIAGNOSIS — N200 Calculus of kidney: Secondary | ICD-10-CM

## 2018-12-06 DIAGNOSIS — N390 Urinary tract infection, site not specified: Secondary | ICD-10-CM | POA: Diagnosis not present

## 2018-12-06 NOTE — Progress Notes (Signed)
   12/06/2018 9:39 AM   Kayla Jimenez Jan 10, 1976 [id number]  Reason for visit: Follow up recurrent UTIs, nephrolithiasis  HPI: I saw [name] back in urology clinic for the above.  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  We had previously discussed that her virtual visit cranberry prophylaxis as well as the consideration of postcoital nitrofurantoin.  She had a CT in April 2020 for work-up of abdominal pain that showed a nonobstructing small 3 mm right mid renal stone.  On KUB today I am unable to identify this stone.  She remains asymptomatic.  There are no aggravating or alleviating factors.  Severity is mild.  Overall she is doing very well.  She also has a history of a mid urethral sling with GYN in the distant past, and denies any recurrent stress incontinence.   ROS: Please see flowsheet from today's date for complete review of systems.  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  We had previously discussed prevention strategies at length.  She had a small non- obstructive 3 mm renal stone on a CT from April 2020, and I am unable to visualize that on KUB today.  We discussed stone prevention strategies, as well as UTI prevention.  Finally, we discussed return precautions including renal colic, recurrent UTIs, or fevers and chills.  I offered her one-year follow-up with KUB for stone surveillance, but she would like to follow-up on an as-needed basis.  Follow-up as needed  A  total of 10 minutes were spent face-to-face with the patient, greater than 50% was spent in patient education, counseling, and coordination of care regarding UTI prevention and nephrolithiasis.  Billey Co, [facility] [address], [place], [place] [address] [phone]

## 2018-12-06 NOTE — Patient Instructions (Signed)

## 2018-12-27 ENCOUNTER — Other Ambulatory Visit: Payer: Self-pay

## 2018-12-27 DIAGNOSIS — J301 Allergic rhinitis due to pollen: Secondary | ICD-10-CM

## 2018-12-27 MED ORDER — MONTELUKAST SODIUM 10 MG PO TABS: 10.0000 mg | ORAL_TABLET | Freq: Every day | ORAL | 3 refills | Status: DC

## 2019-01-05 ENCOUNTER — Other Ambulatory Visit: Payer: Self-pay

## 2019-01-05 ENCOUNTER — Ambulatory Visit (INDEPENDENT_AMBULATORY_CARE_PROVIDER_SITE_OTHER): Payer: BC Managed Care – PPO

## 2019-01-05 ENCOUNTER — Ambulatory Visit: Payer: BC Managed Care – PPO | Admitting: Podiatry

## 2019-01-05 ENCOUNTER — Encounter: Payer: Self-pay | Admitting: Podiatry

## 2019-01-05 DIAGNOSIS — M7751 Other enthesopathy of right foot: Secondary | ICD-10-CM | POA: Diagnosis not present

## 2019-01-05 NOTE — Progress Notes (Signed)
She presents today for a chief complaint of swelling and tenderness to toes #1 #2 of the right foot she is status post Akin osteotomy and a second metatarsal osteotomy date of surgery 02/26/2018 she states that she is doing quite well on with the exception of her increased activity level with a trainer.  She states that she cannot dorsiflex her toes as far as she needs to without it becoming swollen and tender.  Objective: Vital signs are stable alert and oriented x3.  Today she looks perfectly well minimal edema no erythema cellulitis drainage odor she has great range of motion without complications.  Radiographs demonstrate internal fixation in good position.  Assessment: Well-healed surgical foot.  Plan: I highly recommended that she continue with her exercise regimen however I do think that she needs to wear stiffer soled shoe or simply perform alternative exercises to some of those that require severe dorsiflexion.

## 2019-01-14 ENCOUNTER — Encounter: Payer: Self-pay | Admitting: Nurse Practitioner

## 2019-01-14 ENCOUNTER — Other Ambulatory Visit: Payer: Self-pay

## 2019-01-14 ENCOUNTER — Ambulatory Visit: Payer: BC Managed Care – PPO | Admitting: Nurse Practitioner

## 2019-01-14 VITALS — BP 130/90 | HR 85 | Temp 97.4°F | Resp 16 | Ht 63.0 in | Wt 165.0 lb

## 2019-01-14 DIAGNOSIS — F5101 Primary insomnia: Secondary | ICD-10-CM

## 2019-01-14 DIAGNOSIS — F329 Major depressive disorder, single episode, unspecified: Secondary | ICD-10-CM

## 2019-01-14 DIAGNOSIS — R0683 Snoring: Secondary | ICD-10-CM | POA: Diagnosis not present

## 2019-01-14 DIAGNOSIS — G4719 Other hypersomnia: Secondary | ICD-10-CM | POA: Diagnosis not present

## 2019-01-14 DIAGNOSIS — F32A Depression, unspecified: Secondary | ICD-10-CM

## 2019-01-14 DIAGNOSIS — R5383 Other fatigue: Secondary | ICD-10-CM

## 2019-01-14 DIAGNOSIS — F321 Major depressive disorder, single episode, moderate: Secondary | ICD-10-CM

## 2019-01-14 MED ORDER — ESZOPICLONE 2 MG PO TABS: 2.0000 mg | ORAL_TABLET | Freq: Every day | ORAL | 3 refills | Status: DC

## 2019-01-14 NOTE — Progress Notes (Signed)
[facility] [place],  [id number]  Internal MEDICINE  Office Visit Note  Patient Name: Kayla Jimenez  [id number]  [id number]  Date of Service: 01/24/2019  Chief Complaint  Patient presents with  . Follow-up  . Sinusitis    believes its due to change in weather, pressure in nose and forehead    [name] presents today for a medication follow-up. She continues to take Lunesta 2 mg and sertraline 75 mg nightly. She reports that her depressive symptoms have improved since her last visit. She states that rather than depression, she is currently experiencing mostly stress and anxiety related to work and life. 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 Additionally, both of her children are struggling with school and virtual learning. She states that all of the changes and uncertainty are overwhelming. She does continue to report ongoing fatigue, stating that she usually has to get up at 5 am and often does not get to bed until about 11 pm. However, she denies waking frequently at night. She also reports that her husband informed her about 1 month ago that she has been snoring very loudly, describing that she sounds "like a train." [name] states that she has tried changing her pillow and using nasal strips without improvement. She reports having an in-office sleep study a couple of years ago due to excessive daytime sleepiness and falling asleep during driving. She states that the results of that test were normal, and also that she has a deviated septum, but that it has not caused problems in the past.       Current Medication: Outpatient Encounter Medications as of 01/14/2019  Medication Sig  . amphetamine-dextroamphetamine (ADDERALL XR) 30 MG 24 hr capsule Take 1 capsule (30 mg total) by mouth every morning.  . eszopiclone (LUNESTA) 2 MG TABS tablet Take 1 tablet (2 mg total) by mouth at bedtime.   . montelukast (SINGULAIR) 10 MG tablet Take 1 tablet (10 mg total) by mouth at bedtime.  . sertraline (ZOLOFT) 50 MG tablet Take 1.5 tablets (75 mg total) by mouth at bedtime.  . Triamcinolone Acetonide (NASACORT ALLERGY 24HR NA) Place into the nose.  . [DISCONTINUED] eszopiclone (LUNESTA) 2 MG TABS tablet TAKE 1 TABLET (2 MG TOTAL) BY MOUTH AT BEDTIME.   No facility-administered encounter medications on file as of 01/14/2019.     Surgical History: Past Surgical History:  Procedure Laterality Date  . ABDOMINAL HYSTERECTOMY    . ANTERIOR AND POSTERIOR REPAIR  03/16/2012   Procedure: ANTERIOR (CYSTOCELE) AND POSTERIOR REPAIR (RECTOCELE);  Surgeon: [name], MD;  Location: [facility];  Service: Gynecology;  Laterality: N/A;  anterior repair/cysto  . BLADDER SUSPENSION  03/16/2012   Procedure: TRANSVAGINAL TAPE (TVT) PROCEDURE;  Surgeon: [name], MD;  Location: [facility];  Service: Gynecology;  Laterality: N/A;  . BREAST REDUCTION SURGERY  6/04  . CHOLECYSTECTOMY  06/28/14  . CYSTOSCOPY  03/16/2012   Procedure: CYSTOSCOPY;  Surgeon: [name], MD;  Location: [facility];  Service: Gynecology;  Laterality: N/A;  . CYSTOSCOPY  03/16/2017   Procedure: CYSTOSCOPY;  Surgeon: Everett Graff, MD;  Location: [facility];  Service: Gynecology;;  . Murrell Redden & CURRETTAGE/HYSTROSCOPY WITH NOVASURE ABLATION  03/16/2012   Procedure: DILATATION & CURETTAGE/HYSTEROSCOPY WITH NOVASURE ABLATION;  Surgeon: [name], MD;  Location: [facility];  Service: Gynecology;  Laterality: N/A;  . TONSILLECTOMY AND ADENOIDECTOMY    . VAGINAL HYSTERECTOMY  Bilateral 03/16/2017   Procedure: HYSTERECTOMY VAGINAL Bilateral Salpingectomy ;  Surgeon: Everett Graff, MD;  Location: [facility];  Service: Gynecology;  Laterality: Bilateral;    Medical History: Past Medical History:  Diagnosis Date  . Anxiety 08/29/10  . BV (bacterial vaginosis) 08/29/10  . Complication of anesthesia 2004   vomited  . Gross hematuria  07/08/2015  . H/O bladder infections   . H/O mumps   . H/O toxoplasmosis   . H/O varicella   . History of kidney stones   . Infections of kidney    several kidney stones, bladder infections  . Kidney disease 09/03/2011   Left ureteral calculus with partial obstruction, passed 06/09/2014. CT did show perinephric stranding.   . Menometrorrhagia 06/26/10  . Plantar fasciitis   . PMS (premenstrual syndrome) 07/10/11  . Post - coital bleeding 08/29/10  . Renal calculus, left   . Sinus infection   . UTI (lower urinary tract infection) 07/08/2015  . Yeast infection     Family History: Family History  Problem Relation Age of Onset  . Cancer Other        breast  . Cancer Mother        cervical  . Depression Mother   . Drug abuse Mother   . Anemia Mother        blood transfusion  . Migraines Mother     Social History   Socioeconomic History  . Marital status: Married    Spouse name: Not on file  . Number of children: Not on file  . Years of education: Not on file  . Highest education level: Not on file  Occupational History  . Not on file  Social Needs  . Financial resource strain: Not on file  . Food insecurity    Worry: Not on file    Inability: Not on file  . Transportation needs    Medical: Not on file    Non-medical: Not on file  Tobacco Use  . Smoking status: Never Smoker  . Smokeless tobacco: Never Used  Substance and Sexual Activity  . Alcohol use: No  . Drug use: No  . Sexual activity: Yes    Birth control/protection: Surgical    Comment: pt spouse had vasec.   Lifestyle  . Physical activity    Days per week: Not on file    Minutes per session: Not on file  . Stress: Not on file  Relationships  . Social Herbalist on phone: Not on file    Gets together: Not on file    Attends religious service: Not on file    Active member of club or organization: Not on file    Attends meetings of clubs or organizations: Not on file    Relationship status: Not  on file  . Intimate partner violence    Fear of current or ex partner: Not on file    Emotionally abused: Not on file    Physically abused: Not on file    Forced sexual activity: Not on file  Other Topics Concern  . Not on file  Social History Narrative  . Not on file      Review of Systems  Constitutional: Positive for fatigue. Negative for fever.  HENT: Positive for postnasal drip and sinus pressure. Negative for congestion and rhinorrhea.   Respiratory: Negative for cough and shortness of breath.   Cardiovascular: Negative for chest pain and palpitations.  Gastrointestinal: Positive for constipation. Negative for diarrhea and nausea.  Endocrine: Negative for cold intolerance, heat intolerance, polydipsia and polyuria.  Neurological: Negative for dizziness and headaches.  Psychiatric/Behavioral: Positive for sleep disturbance. Negative for dysphoric mood. The patient is nervous/anxious.     Today's Vitals   01/14/19 1451  BP: 130/90  Pulse: 85  Resp: 16  Temp: (!) 97.4 F (36.3 C)  SpO2: 99%  Weight: 165 lb (74.8 kg)  Height: 5\' 3"  (1.6 m)   Body mass index is 29.23 kg/m.  Physical Exam Vitals signs and nursing note reviewed.  Constitutional:      Appearance: Normal appearance.  HENT:     Head: Normocephalic and atraumatic.     Nose: Nose normal.  Eyes:     Extraocular Movements: Extraocular movements intact.     Pupils: Pupils are equal, round, and reactive to light.  Neck:     Musculoskeletal: Normal range of motion and neck supple.  Cardiovascular:     Rate and Rhythm: Normal rate and regular rhythm.     Heart sounds: Normal heart sounds.  Pulmonary:     Effort: Pulmonary effort is normal.     Breath sounds: Normal breath sounds.  Skin:    Capillary Refill: Capillary refill takes less than 2 seconds.  Neurological:     Mental Status: She is alert and oriented to person, place, and time.  Psychiatric:        Mood and Affect: Mood normal.         Behavior: Behavior normal.        Thought Content: Thought content normal.        Judgment: Judgment normal.    Assessment/Plan: 1. Excessive daytime sleepiness Will get home sleep study for further evaluation.  - Home sleep test  2. Fatigue due to depression Will get home sleep study for further evaluation.   3. Loud snoring Will get home sleep study for further evaluation.  - Home sleep test  4. Primary insomnia May continue to take lunesta 2mg  at bedtime as needed for insomnia. Refills provided today.  - eszopiclone (LUNESTA) 2 MG TABS tablet; Take 1 tablet (2 mg total) by mouth at bedtime.  Dispense: 30 tablet; Refill: 3  5. Moderate major depression ([place]) Continue sertraline as prescribed   General Counseling: Jamiah verbalizes understanding of the findings of todays visit and agrees with plan of treatment. I have discussed any further diagnostic evaluation that may be needed or ordered today. We also reviewed her medications today. she has been encouraged to call the office with any questions or concerns that should arise related to todays visit.  This patient was seen by Leretha Pol FNP Collaboration with Dr Lavera Guise as a part of collaborative care agreement  Orders Placed This Encounter  Procedures  . Home sleep test    Meds ordered this encounter  Medications  . eszopiclone (LUNESTA) 2 MG TABS tablet    Sig: Take 1 tablet (2 mg total) by mouth at bedtime.    Dispense:  30 tablet    Refill:  3    DX F51.01    Order Specific Question:   Supervising Provider    Answer:   Lavera Guise [id number]    Time spent: 19 Minutes      Dr Lavera Guise Internal medicine

## 2019-01-24 DIAGNOSIS — G4719 Other hypersomnia: Secondary | ICD-10-CM | POA: Insufficient documentation

## 2019-01-24 DIAGNOSIS — R0683 Snoring: Secondary | ICD-10-CM | POA: Insufficient documentation

## 2019-01-25 ENCOUNTER — Other Ambulatory Visit: Payer: Self-pay | Admitting: Nurse Practitioner

## 2019-01-25 DIAGNOSIS — F321 Major depressive disorder, single episode, moderate: Secondary | ICD-10-CM

## 2019-02-03 ENCOUNTER — Telehealth: Payer: Self-pay

## 2019-02-03 NOTE — Telephone Encounter (Signed)
Left a message and asked pt to call back regarding home sleep study authorization and benefits, I have it approved and ready to schedule. beth

## 2019-02-09 ENCOUNTER — Other Ambulatory Visit: Payer: Self-pay

## 2019-02-09 ENCOUNTER — Ambulatory Visit: Payer: BC Managed Care – PPO | Admitting: Internal Medicine

## 2019-02-09 DIAGNOSIS — G4719 Other hypersomnia: Secondary | ICD-10-CM | POA: Diagnosis not present

## 2019-02-18 ENCOUNTER — Telehealth: Payer: Self-pay

## 2019-02-18 NOTE — Telephone Encounter (Signed)
Called lmom informing patient of appointment. klh 

## 2019-02-22 ENCOUNTER — Encounter (INDEPENDENT_AMBULATORY_CARE_PROVIDER_SITE_OTHER): Payer: Self-pay

## 2019-02-22 ENCOUNTER — Ambulatory Visit: Payer: BC Managed Care – PPO | Admitting: Internal Medicine

## 2019-02-22 ENCOUNTER — Other Ambulatory Visit: Payer: Self-pay

## 2019-02-22 ENCOUNTER — Encounter: Payer: Self-pay | Admitting: Internal Medicine

## 2019-02-22 VITALS — BP 140/82 | HR 99 | Temp 97.6°F | Resp 16 | Ht 63.0 in | Wt 163.0 lb

## 2019-02-22 DIAGNOSIS — Z23 Encounter for immunization: Secondary | ICD-10-CM

## 2019-02-22 DIAGNOSIS — G4719 Other hypersomnia: Secondary | ICD-10-CM

## 2019-02-22 DIAGNOSIS — G4734 Idiopathic sleep related nonobstructive alveolar hypoventilation: Secondary | ICD-10-CM | POA: Diagnosis not present

## 2019-02-22 NOTE — Progress Notes (Signed)
[facility] [place], [place] [id number]  Pulmonary Sleep Medicine   Office Visit Note  Patient Name: Kayla Jimenez DOB: 22-Jul-1975 MRN [id number]  Date of Service: 02/22/2019  Complaints/HPI: Pt is here for follow up on sleep study.  Sleep study was ordered for patient due to excessive snoring.  The Sleep study shows an overall AHI 3.4 which does not indicate sleep apnea.  However, she did have some mild oxygen desaturation noted on the study.    ROS  General: (-) fever, (-) chills, (-) night sweats, (-) weakness Skin: (-) rashes, (-) itching,. Eyes: (-) visual changes, (-) redness, (-) itching. Nose and Sinuses: (-) nasal stuffiness or itchiness, (-) postnasal drip, (-) nosebleeds, (-) sinus trouble. Mouth and Throat: (-) sore throat, (-) hoarseness. Neck: (-) swollen glands, (-) enlarged thyroid, (-) neck pain. Respiratory: - cough, (-) bloody sputum, - shortness of breath, - wheezing. Cardiovascular: - ankle swelling, (-) chest pain. Lymphatic: (-) lymph node enlargement. Neurologic: (-) numbness, (-) tingling. Psychiatric: (-) anxiety, (-) depression   Current Medication: Outpatient Encounter Medications as of 02/22/2019  Medication Sig  . amphetamine-dextroamphetamine (ADDERALL XR) 30 MG 24 hr capsule Take 1 capsule (30 mg total) by mouth every morning.  . eszopiclone (LUNESTA) 2 MG TABS tablet Take 1 tablet (2 mg total) by mouth at bedtime.  . montelukast (SINGULAIR) 10 MG tablet Take 1 tablet (10 mg total) by mouth at bedtime.  . sertraline (ZOLOFT) 50 MG tablet TAKE 1.5 TABLETS (75 MG TOTAL) BY MOUTH AT BEDTIME.  . Triamcinolone Acetonide (NASACORT ALLERGY 24HR NA) Place into the nose.   No facility-administered encounter medications on file as of 02/22/2019.     Surgical History: Past Surgical History:  Procedure Laterality Date  . ABDOMINAL HYSTERECTOMY    . ANTERIOR AND POSTERIOR REPAIR  03/16/2012   Procedure: ANTERIOR (CYSTOCELE)  AND POSTERIOR REPAIR (RECTOCELE);  Surgeon: [name], MD;  Location: [facility];  Service: Gynecology;  Laterality: N/A;  anterior repair/cysto  . BLADDER SUSPENSION  03/16/2012   Procedure: TRANSVAGINAL TAPE (TVT) PROCEDURE;  Surgeon: [name], MD;  Location: [facility];  Service: Gynecology;  Laterality: N/A;  . BREAST REDUCTION SURGERY  6/04  . CHOLECYSTECTOMY  06/28/14  . CYSTOSCOPY  03/16/2012   Procedure: CYSTOSCOPY;  Surgeon: [name], MD;  Location: [facility];  Service: Gynecology;  Laterality: N/A;  . CYSTOSCOPY  03/16/2017   Procedure: CYSTOSCOPY;  Surgeon: Everett Graff, MD;  Location: [facility];  Service: Gynecology;;  . Murrell Redden & CURRETTAGE/HYSTROSCOPY WITH NOVASURE ABLATION  03/16/2012   Procedure: DILATATION & CURETTAGE/HYSTEROSCOPY WITH NOVASURE ABLATION;  Surgeon: [name], MD;  Location: [facility];  Service: Gynecology;  Laterality: N/A;  . TONSILLECTOMY AND ADENOIDECTOMY    . VAGINAL HYSTERECTOMY Bilateral 03/16/2017   Procedure: HYSTERECTOMY VAGINAL Bilateral Salpingectomy ;  Surgeon: Everett Graff, MD;  Location: [facility];  Service: Gynecology;  Laterality: Bilateral;    Medical History: Past Medical History:  Diagnosis Date  . Anxiety 08/29/10  . BV (bacterial vaginosis) 08/29/10  . Complication of anesthesia 2004   vomited  . Gross hematuria 07/08/2015  . H/O bladder infections   . H/O mumps   . H/O toxoplasmosis   . H/O varicella   . History of kidney stones   . Infections of kidney    several kidney stones, bladder infections  . Kidney disease 09/03/2011   Left ureteral calculus with partial obstruction, passed 06/09/2014. CT did show perinephric stranding.   Marland Kitchen  Menometrorrhagia 06/26/10  . Plantar fasciitis   . PMS (premenstrual syndrome) 07/10/11  . Post - coital bleeding 08/29/10  . Renal calculus, left   . Sinus infection   . UTI (lower urinary tract infection) 07/08/2015  . Yeast infection     Family History: Family History  Problem  Relation Age of Onset  . Cancer Other        breast  . Cancer Mother        cervical  . Depression Mother   . Drug abuse Mother   . Anemia Mother        blood transfusion  . Migraines Mother     Social History: Social History   Socioeconomic History  . Marital status: Married    Spouse name: Not on file  . Number of children: Not on file  . Years of education: Not on file  . Highest education level: Not on file  Occupational History  . Not on file  Social Needs  . Financial resource strain: Not on file  . Food insecurity    Worry: Not on file    Inability: Not on file  . Transportation needs    Medical: Not on file    Non-medical: Not on file  Tobacco Use  . Smoking status: Never Smoker  . Smokeless tobacco: Never Used  Substance and Sexual Activity  . Alcohol use: No  . Drug use: No  . Sexual activity: Yes    Birth control/protection: Surgical    Comment: pt spouse had vasec.   Lifestyle  . Physical activity    Days per week: Not on file    Minutes per session: Not on file  . Stress: Not on file  Relationships  . Social Herbalist on phone: Not on file    Gets together: Not on file    Attends religious service: Not on file    Active member of club or organization: Not on file    Attends meetings of clubs or organizations: Not on file    Relationship status: Not on file  . Intimate partner violence    Fear of current or ex partner: Not on file    Emotionally abused: Not on file    Physically abused: Not on file    Forced sexual activity: Not on file  Other Topics Concern  . Not on file  Social History Narrative  . Not on file    Vital Signs: Blood pressure 140/82, pulse 99, temperature 97.6 F (36.4 C), resp. rate 16, height 5\' 3"  (1.6 m), weight 163 lb (73.9 kg), SpO2 99 %.  Examination: General Appearance: The patient is well-developed, well-nourished, and in no distress. Skin: Gross inspection of skin unremarkable. Head:  normocephalic, no gross deformities. Eyes: no gross deformities noted. ENT: ears appear grossly normal no exudates. Neck: Supple. No thyromegaly. No LAD. Respiratory: clear bilaterally. Cardiovascular: Normal S1 and S2 without murmur or rub. Extremities: No cyanosis. pulses are equal. Neurologic: Alert and oriented. No involuntary movements.  LABS: No results found for this or any previous visit (from the past 2160 hour(s)).  Radiology: Abdomen 1 View (kub)  Result Date: 12/06/2018 CLINICAL DATA:  RIGHT nephrolithiasis EXAM: ABDOMEN - 1 VIEW COMPARISON:  07/10/2014 FINDINGS: Surgical clips RIGHT upper quadrant likely cholecystectomy. Normal bowel gas pattern. RIGHT pelvic phleboliths stable. No urinary tract calcification identified. Lung bases clear. Osseous structures unremarkable. IMPRESSION: No urinary tract calcifications seen. Electronically Signed   By: Crist Infante.D.  On: 12/06/2018 08:52    No results found.  No results found.    Assessment and Plan: Patient Active Problem List   Diagnosis Date Noted  . Excessive daytime sleepiness 01/24/2019  . Loud snoring 01/24/2019  . Fatigue due to depression 10/10/2018  . Alopecia 10/07/2018  . 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 Loss of hair 10/07/2018  . Body mass index (BMI) of 25.0 to 29.9 10/07/2018  . Hematuria 06/28/2018  . Right flank pain 06/28/2018  . H/O: hysterectomy 11/19/2017  . Dysuria 10/20/2017  . Allergic rhinitis due to pollen 08/12/2017  . Attention and concentration deficit 08/12/2017  . Moderate major depression ([place]) 08/12/2017  . Primary insomnia 08/12/2017  . Irregular bleeding 03/16/2017  . Plantar fasciitis 03/10/2017  . SUI (stress urinary incontinence, female) 08/14/2015  . Cystocele, midline 08/14/2015  . Renal calculi 07/26/2015  . Urinary tract infection with hematuria 07/08/2015  . Gross hematuria 07/08/2015  . History of renal stone 07/08/2015  . Abdominal bloating 07/10/2014  .  Gallstones 06/13/2014  . Yeast infection 09/03/2011  . H/O varicella 09/03/2011  . H/O mumps 09/03/2011  . Post - coital bleeding 09/03/2011  . Anxiety 09/03/2011  . Premenstrual tension syndrome 09/03/2011  . H/O bladder infections 09/03/2011  . Kidney disease 09/03/2011  . Menorrhagia 07/10/2011    1. Nocturnal hypoxia Will get overnight oximetry to evaluate for nocturnal hypoxia.  - Pulse oximetry, overnight; Future  2. Excessive daytime sleepiness Sleep study does not indicate osa.  Will evaluate for nocturnal hypoxia.  3. Flu vaccine need - Flu Vaccine MDCK QUAD PF  General Counseling: I have discussed the findings of the evaluation and examination with Bernardette.  I have also discussed any further diagnostic evaluation thatmay be needed or ordered today. Iyannah verbalizes understanding of the findings of todays visit. We also reviewed her medications today and discussed drug interactions and side effects including but not limited excessive drowsiness and altered mental states. We also discussed that there is always a risk not just to her but also people around her. she has been encouraged to call the office with any questions or concerns that should arise related to todays visit.    Time spent: 25 This patient was seen by Orson Gear AGNP-C in Collaboration with Dr. Devona Konig as a part of collaborative care agreement.   I have personally obtained a history, examined the patient, evaluated laboratory and imaging results, formulated the assessment and plan and placed orders.    Allyne Gee, MD [facility] Pulmonary and Critical Care Sleep medicine

## 2019-02-23 ENCOUNTER — Telehealth: Payer: Self-pay

## 2019-02-23 NOTE — Procedures (Signed)
[facility] [place], [place] [address]  Sleep Specialist: Allyne Gee, MD [place] Sleep Study Interpretation  Patient Name: Kayla Jimenez Patient MR [id number] DOB:12-08-1975  Date of Study: February 13, 2019  Indications for study: Hypersomnia snoring  BMI: 29.2 kg/m       Respiratory Data:  Total AHI: 3.4/h  Total Obstructive Apneas: 7  Total Central Apneas: 2  Total Mixed Apneas: 0  Total Hypopneas: 13  If the AHI is greater than 5 per hour patient qualifies for PAP evaluation  Oximetry Data:  Oxygen Desaturation Index: 4/h  Lowest Desaturation: 87%  Cardiac Data:  Minimum Heart Rate: 69  Maximum Heart Rate: 110   Impression / Diagnosis:  This apnea study does not appear to show any significant obstructive sleep apnea.  Patient did have some oxygen desaturations and therefore consideration should be given to nocturnal oxygen therapy.  Patient clinical correlation is recommended.  GENERAL Recommendations:  1.  Consider Auto PAP with pressure ranges 5-20 cmH20 with download, or facility based PAP Titration Study  2.  Consider PAP interface mask fitted for patient comfort, Heated Humidification & PAP compliance monitoring (1 month, 3 months & 12 months after PAP initiation)  3. Consider treatment with mandibular advancement splint (MAS) or referral to an ENT surgeon for modification to the upper airway if the patient prefers an alternate therapy or the PAP trial is unsuccessful  4. Sleep hygiene measures should be discussed with the patient  5. Behavioral therapy such as weight reduction or smoking cessation as appropriate for the patient  6. Advise patient against the use of alcohol or sedatives in so much as these substances can worsen excessive daytime sleepiness and respiratory disturbances of sleep  7. Advise patient against participating in potentially dangerous activities while drowsy such as operating a  motor vehicle, heavy equipment or power tools as it can put them and others in danger  8. Advise patient of the long term consequences of OSA if left untreated, need for treatment and close follow up  9. Clinical follow up as deemed necessary     This Level III home sleep study was performed using the [employer], a 4 channel screening device subject to limitations. Depending on actual total sleep time, not measured in this study, the AHI (sum of apneas and hypopneas/hr of sleep) and therefore the severity of sleep apnea may be underestimated. As with any single night study, including Level 1 attended PSG, severity of sleep apnea may also be underestimated due to the lack of supine and/or REM sleep.  The interpretation associated with this report is based on normal values and degrees of severity in accordance with AASM parameters and/or estimated from multiple sources in the literature for adults ages 63-80+. These may not agree with the displayed values. The patient's treating physician should use the interpretation and recommendations in conjunction with the overall clinical evaluation and treatment of the patient.  Some of the terminology used in this scored [facility]nea[facility] report was developed several years ago and may not always be in accordance with current nomenclature. This in no way affects the accuracy of the data or the reliability of the interpretation and recommendations.

## 2019-02-23 NOTE — Telephone Encounter (Signed)
Faxed [employer] POX order, advised pt to callonce she gets it delivered so we can set up appt for follow up results. Kayla Jimenez

## 2019-02-28 NOTE — Progress Notes (Signed)
May need referral to Dr. Devona Konig for OSA. Discuss at visit 03/11/2019

## 2019-03-10 ENCOUNTER — Telehealth: Payer: Self-pay

## 2019-03-10 NOTE — Telephone Encounter (Signed)
Confirmed 03-15-19 ov as virtual.

## 2019-03-15 ENCOUNTER — Encounter: Payer: Self-pay | Admitting: Nurse Practitioner

## 2019-03-15 ENCOUNTER — Ambulatory Visit: Payer: BC Managed Care – PPO | Admitting: Nurse Practitioner

## 2019-03-15 ENCOUNTER — Other Ambulatory Visit: Payer: Self-pay

## 2019-03-15 DIAGNOSIS — R41840 Attention and concentration deficit: Secondary | ICD-10-CM | POA: Diagnosis not present

## 2019-03-15 DIAGNOSIS — F321 Major depressive disorder, single episode, moderate: Secondary | ICD-10-CM

## 2019-03-15 DIAGNOSIS — L700 Acne vulgaris: Secondary | ICD-10-CM | POA: Diagnosis not present

## 2019-03-15 MED ORDER — CLINDAMYCIN PHOS-BENZOYL PEROX 1-5 % EX GEL: Freq: Two times a day (BID) | CUTANEOUS | 2 refills | Status: DC

## 2019-03-15 NOTE — Progress Notes (Signed)
[facility] [place], [place] [address]  Internal MEDICINE  Telephone Visit  Patient Name: Kayla Jimenez  [id number]  [id number]  Date of Service: 03/16/2019  I connected with the patient at 5:13pm by telephone and verified the patients identity using two identifiers.   I discussed the limitations, risks, security and privacy concerns of performing an evaluation and management service by telephone and the availability of in person appointments. I also discussed with the patient that there may be a patient responsible charge related to the service.  The patient expressed understanding and agrees to proceed.    Chief Complaint  Patient presents with  . Telephone Assessment  . Telephone Screen  . Anxiety    The patient has been contacted via telephone for follow up visit due to concerns for spread of novel coronavirus. 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 She does state that stress levels are increased. Feels as though her stress is well controlled with current dosing of zoloft. She does have prescription for adderall XR which she has not started back using. States that she may start taking this again soon, as it does help her stay focused and on track while at work. She currently does not need new prescription for this. She does take lunesta at night. She does not feel like she would be able to sleep well without taking this at bedtime. Since starting back to school, she has noted an increase in her acne where the mask touches her face. There differen gel, which she used in the past is not really helping.       Current Medication: Outpatient Encounter Medications as of 03/15/2019  Medication Sig  . amphetamine-dextroamphetamine (ADDERALL XR) 30 MG 24 hr capsule Take 1 capsule (30 mg total) by mouth every morning.  . eszopiclone (LUNESTA) 2 MG TABS tablet Take 1  tablet (2 mg total) by mouth at bedtime.  . montelukast (SINGULAIR) 10 MG tablet Take 1 tablet (10 mg total) by mouth at bedtime.  . sertraline (ZOLOFT) 50 MG tablet TAKE 1.5 TABLETS (75 MG TOTAL) BY MOUTH AT BEDTIME.  . Triamcinolone Acetonide (NASACORT ALLERGY 24HR NA) Place into the nose.  . clindamycin-benzoyl peroxide (BENZACLIN) gel Apply topically 2 (two) times daily.   No facility-administered encounter medications on file as of 03/15/2019.     Surgical History: Past Surgical History:  Procedure Laterality Date  . ABDOMINAL HYSTERECTOMY    . ANTERIOR AND POSTERIOR REPAIR  03/16/2012   Procedure: ANTERIOR (CYSTOCELE) AND POSTERIOR REPAIR (RECTOCELE);  Surgeon: [name], MD;  Location: [facility];  Service: Gynecology;  Laterality: N/A;  anterior repair/cysto  . BLADDER SUSPENSION  03/16/2012   Procedure: TRANSVAGINAL TAPE (TVT) PROCEDURE;  Surgeon: [name], MD;  Location: [facility];  Service: Gynecology;  Laterality: N/A;  . BREAST REDUCTION SURGERY  6/04  . CHOLECYSTECTOMY  06/28/14  . CYSTOSCOPY  03/16/2012   Procedure: CYSTOSCOPY;  Surgeon: [name], MD;  Location: [facility];  Service: Gynecology;  Laterality: N/A;  . CYSTOSCOPY  03/16/2017   Procedure: CYSTOSCOPY;  Surgeon: Everett Graff, MD;  Location: [facility];  Service: Gynecology;;  . Murrell Redden & CURRETTAGE/HYSTROSCOPY WITH NOVASURE ABLATION  03/16/2012   Procedure: DILATATION & CURETTAGE/HYSTEROSCOPY WITH NOVASURE ABLATION;  Surgeon: [name], MD;  Location: [facility];  Service: Gynecology;  Laterality: N/A;  . TONSILLECTOMY AND ADENOIDECTOMY    . VAGINAL HYSTERECTOMY  Bilateral 03/16/2017   Procedure: HYSTERECTOMY VAGINAL Bilateral Salpingectomy ;  Surgeon: Everett Graff, MD;  Location: [facility];  Service: Gynecology;  Laterality: Bilateral;    Medical History: Past Medical History:  Diagnosis Date  . Anxiety 08/29/10  . BV (bacterial vaginosis) 08/29/10  . Complication of anesthesia 2004   vomited   . Gross hematuria 07/08/2015  . H/O bladder infections   . H/O mumps   . H/O toxoplasmosis   . H/O varicella   . History of kidney stones   . Infections of kidney    several kidney stones, bladder infections  . Kidney disease 09/03/2011   Left ureteral calculus with partial obstruction, passed 06/09/2014. CT did show perinephric stranding.   . Menometrorrhagia 06/26/10  . Plantar fasciitis   . PMS (premenstrual syndrome) 07/10/11  . Post - coital bleeding 08/29/10  . Renal calculus, left   . Sinus infection   . UTI (lower urinary tract infection) 07/08/2015  . Yeast infection     Family History: Family History  Problem Relation Age of Onset  . Cancer Other        breast  . Cancer Mother        cervical  . Depression Mother   . Drug abuse Mother   . Anemia Mother        blood transfusion  . Migraines Mother     Social History   Socioeconomic History  . Marital status: Married    Spouse name: Not on file  . Number of children: Not on file  . Years of education: Not on file  . Highest education level: Not on file  Occupational History  . Not on file  Social Needs  . Financial resource strain: Not on file  . Food insecurity    Worry: Not on file    Inability: Not on file  . Transportation needs    Medical: Not on file    Non-medical: Not on file  Tobacco Use  . Smoking status: Never Smoker  . Smokeless tobacco: Never Used  Substance and Sexual Activity  . Alcohol use: No  . Drug use: No  . Sexual activity: Yes    Birth control/protection: Surgical    Comment: pt spouse had vasec.   Lifestyle  . Physical activity    Days per week: Not on file    Minutes per session: Not on file  . Stress: Not on file  Relationships  . Social Herbalist on phone: Not on file    Gets together: Not on file    Attends religious service: Not on file    Active member of club or organization: Not on file    Attends meetings of clubs or organizations: Not on file     Relationship status: Not on file  . Intimate partner violence    Fear of current or ex partner: Not on file    Emotionally abused: Not on file    Physically abused: Not on file    Forced sexual activity: Not on file  Other Topics Concern  . Not on file  Social History Narrative  . Not on file      Review of Systems  Constitutional: Positive for fatigue. Negative for activity change, chills and unexpected weight change.  HENT: Negative for congestion, postnasal drip, rhinorrhea, sneezing and sore throat.   Respiratory: Negative for cough, chest tightness and shortness of breath.   Cardiovascular: Negative for chest pain and palpitations.  Gastrointestinal: Negative for  abdominal pain, constipation, diarrhea, nausea and vomiting.  Endocrine: Negative for cold intolerance, heat intolerance, polydipsia and polyuria.  Musculoskeletal: Negative for arthralgias, back pain, joint swelling and neck pain.  Skin: Negative for rash.       increased facial acne   Allergic/Immunologic: Positive for environmental allergies.  Neurological: Negative for dizziness, tremors, numbness and headaches.  Hematological: Negative for adenopathy. Does not bruise/bleed easily.  Psychiatric/Behavioral: Positive for dysphoric mood and sleep disturbance. Negative for behavioral problems (Depression) and suicidal ideas. The patient is nervous/anxious.     Vital Signs: There were no vitals taken for this visit.   Observation/Objective:   The patient is alert and oriented. She is pleasant and answers all questions appropriately. Breathing is non-labored. She is in no acute distress at this time.    Assessment/Plan: 1. Acne vulgaris May use benzaclin gel. Apply to affected area one to two times daily as needed for acne. New prescription sent to her pharmacy.  - clindamycin-benzoyl peroxide (BENZACLIN) gel; Apply topically 2 (two) times daily.  Dispense: 25 g; Refill: 2  2. Moderate major depression  (HCC) Generally stable. Continue to take zoloft as prescribed   3. Attention and concentration deficit May use adderall as needed and as previously prescribed   General Counseling: Damariz verbalizes understanding of the findings of today's phone visit and agrees with plan of treatment. I have discussed any further diagnostic evaluation that may be needed or ordered today. We also reviewed her medications today. she has been encouraged to call the office with any questions or concerns that should arise related to todays visit.   This patient was seen by [facility] with Dr Lavera Guise as a part of collaborative care agreement  Meds ordered this encounter  Medications  . clindamycin-benzoyl peroxide (BENZACLIN) gel    Sig: Apply topically 2 (two) times daily.    Dispense:  25 g    Refill:  2    Order Specific Question:   Supervising Provider    Answer:   Lavera Guise [[id number]]    Time spent: 76 Minutes    Dr Lavera Guise Internal medicine

## 2019-03-16 DIAGNOSIS — L700 Acne vulgaris: Secondary | ICD-10-CM | POA: Insufficient documentation

## 2019-03-24 ENCOUNTER — Telehealth: Payer: Self-pay

## 2019-03-24 NOTE — Telephone Encounter (Signed)
Left a message and asked pt to call back and schedule an appointment to review overnight oximetry results with Dr Devona Konig. Beth

## 2019-04-06 ENCOUNTER — Telehealth: Payer: Self-pay

## 2019-04-06 NOTE — Telephone Encounter (Signed)
Confirmed appointment with patient. klh °

## 2019-04-11 ENCOUNTER — Ambulatory Visit (INDEPENDENT_AMBULATORY_CARE_PROVIDER_SITE_OTHER): Payer: BC Managed Care – PPO | Admitting: Internal Medicine

## 2019-04-11 ENCOUNTER — Encounter: Payer: Self-pay | Admitting: Internal Medicine

## 2019-04-11 ENCOUNTER — Other Ambulatory Visit: Payer: Self-pay

## 2019-04-11 DIAGNOSIS — R0683 Snoring: Secondary | ICD-10-CM | POA: Diagnosis not present

## 2019-04-11 DIAGNOSIS — Z9189 Other specified personal risk factors, not elsewhere classified: Secondary | ICD-10-CM

## 2019-04-11 DIAGNOSIS — J342 Deviated nasal septum: Secondary | ICD-10-CM

## 2019-04-11 NOTE — Progress Notes (Signed)
[facility] [place], [place] [address]  Internal MEDICINE  Telephone Visit  Patient Name: Kayla Jimenez  [id number]  [id number]  Date of Service: 04/11/2019  I connected with the patient at 433 by telephone and verified the patients identity using two identifiers.   I discussed the limitations, risks, security and privacy concerns of performing an evaluation and management service by telephone and the availability of in person appointments. I also discussed with the patient that there may be a patient responsible charge related to the service.  The patient expressed understanding and agrees to proceed.    Chief Complaint  Patient presents with  . Telephone Assessment    exposure to covid   . Telephone Screen  . Headache  . Follow-up    Overnight oximetry     HPI  Pt is seen via telephone.  Her overnight oximetry shows and overall 1.1 min of a sat below 88%. She does not qualify for oxygen at this time. Previous sleep study does not show significant sleep apnea.  Her excessive snoring is most likely related to her deviated septum. She has seen ENT in the past and told she had a deviates septum.  She has not been able to afford to have the surgery.   Also, her son has been exposed to covid, and she is dealing with this. Awaiting testing results.  Her family is being tested.     Current Medication: Outpatient Encounter Medications as of 04/11/2019  Medication Sig  . amphetamine-dextroamphetamine (ADDERALL XR) 30 MG 24 hr capsule Take 1 capsule (30 mg total) by mouth every morning.  . clindamycin-benzoyl peroxide (BENZACLIN) gel Apply topically 2 (two) times daily.  . eszopiclone (LUNESTA) 2 MG TABS tablet Take 1 tablet (2 mg total) by mouth at bedtime.  . montelukast (SINGULAIR) 10 MG tablet Take 1 tablet (10 mg total) by mouth at bedtime.  . sertraline (ZOLOFT) 50 MG tablet TAKE 1.5 TABLETS (75 MG TOTAL) BY MOUTH AT BEDTIME.  . Triamcinolone Acetonide  (NASACORT ALLERGY 24HR NA) Place into the nose.   No facility-administered encounter medications on file as of 04/11/2019.    Surgical History: Past Surgical History:  Procedure Laterality Date  . ABDOMINAL HYSTERECTOMY    . ANTERIOR AND POSTERIOR REPAIR  03/16/2012   Procedure: ANTERIOR (CYSTOCELE) AND POSTERIOR REPAIR (RECTOCELE);  Surgeon: [name], MD;  Location: [facility];  Service: Gynecology;  Laterality: N/A;  anterior repair/cysto  . BLADDER SUSPENSION  03/16/2012   Procedure: TRANSVAGINAL TAPE (TVT) PROCEDURE;  Surgeon: [name], MD;  Location: [facility];  Service: Gynecology;  Laterality: N/A;  . BREAST REDUCTION SURGERY  6/04  . CHOLECYSTECTOMY  06/28/14  . CYSTOSCOPY  03/16/2012   Procedure: CYSTOSCOPY;  Surgeon: [name], MD;  Location: [facility];  Service: Gynecology;  Laterality: N/A;  . CYSTOSCOPY  03/16/2017   Procedure: CYSTOSCOPY;  Surgeon: Everett Graff, MD;  Location: [facility];  Service: Gynecology;;  . Murrell Redden & CURRETTAGE/HYSTROSCOPY WITH NOVASURE ABLATION  03/16/2012   Procedure: DILATATION & CURETTAGE/HYSTEROSCOPY WITH NOVASURE ABLATION;  Surgeon: [name], MD;  Location: [facility];  Service: Gynecology;  Laterality: N/A;  . TONSILLECTOMY AND ADENOIDECTOMY    . VAGINAL HYSTERECTOMY Bilateral 03/16/2017   Procedure: HYSTERECTOMY VAGINAL Bilateral Salpingectomy ;  Surgeon: Everett Graff, MD;  Location: [facility];  Service: Gynecology;  Laterality: Bilateral;    Medical History: Past Medical History:  Diagnosis Date  . Anxiety 08/29/10  . BV (bacterial vaginosis) 08/29/10  .  Complication of anesthesia 2004   vomited  . Gross hematuria 07/08/2015  . H/O bladder infections   . H/O mumps   . H/O toxoplasmosis   . H/O varicella   . History of kidney stones   . Infections of kidney    several kidney stones, bladder infections  . Kidney disease 09/03/2011   Left ureteral calculus with partial obstruction, passed 06/09/2014. CT did show  perinephric stranding.   . Menometrorrhagia 06/26/10  . Plantar fasciitis   . PMS (premenstrual syndrome) 07/10/11  . Post - coital bleeding 08/29/10  . Renal calculus, left   . Sinus infection   . UTI (lower urinary tract infection) 07/08/2015  . Yeast infection     Family History: Family History  Problem Relation Age of Onset  . Cancer Other        breast  . Cancer Mother        cervical  . Depression Mother   . Drug abuse Mother   . Anemia Mother        blood transfusion  . Migraines Mother     Social History   Socioeconomic History  . Marital status: Married    Spouse name: Not on file  . Number of children: Not on file  . Years of education: Not on file  . Highest education level: Not on file  Occupational History  . Not on file  Tobacco Use  . Smoking status: Never Smoker  . Smokeless tobacco: Never Used  Substance and Sexual Activity  . Alcohol use: No  . Drug use: No  . Sexual activity: Yes    Birth control/protection: Surgical    Comment: pt spouse had vasec.   Other Topics Concern  . Not on file  Social History Narrative  . Not on file   Social Determinants of Health   Financial Resource Strain:   . Difficulty of Paying Living Expenses: Not on file  Food Insecurity:   . Worried About Charity fundraiser in the Last Year: Not on file  . Ran Out of Food in the Last Year: Not on file  Transportation Needs:   . Lack of Transportation (Medical): Not on file  . Lack of Transportation (Non-Medical): Not on file  Physical Activity:   . Days of Exercise per Week: Not on file  . Minutes of Exercise per Session: Not on file  Stress:   . Feeling of Stress : Not on file  Social Connections:   . Frequency of Communication with Friends and Family: Not on file  . Frequency of Social Gatherings with Friends and Family: Not on file  . Attends Religious Services: Not on file  . Active Member of Clubs or Organizations: Not on file  . Attends Archivist  Meetings: Not on file  . Marital Status: Not on file  Intimate Partner Violence:   . Fear of Current or Ex-Partner: Not on file  . Emotionally Abused: Not on file  . Physically Abused: Not on file  . Sexually Abused: Not on file      Review of Systems  Constitutional: Negative for chills, fatigue and unexpected weight change.  HENT: Negative for congestion, rhinorrhea, sneezing and sore throat.   Eyes: Negative for photophobia, pain and redness.  Respiratory: Negative for cough, chest tightness and shortness of breath.   Cardiovascular: Negative for chest pain and palpitations.  Gastrointestinal: Negative for abdominal pain, constipation, diarrhea, nausea and vomiting.  Endocrine: Negative.   Genitourinary: Negative for dysuria  and frequency.  Musculoskeletal: Negative for arthralgias, back pain, joint swelling and neck pain.  Skin: Negative for rash.  Allergic/Immunologic: Negative.   Neurological: Negative for tremors and numbness.  Hematological: Negative for adenopathy. Does not bruise/bleed easily.  Psychiatric/Behavioral: Negative for behavioral problems and sleep disturbance. The patient is not nervous/anxious.     Vital Signs: There were no vitals taken for this visit.   Observation/Objective:  Well sounding, NAD noted.    Assessment/Plan: 1. Deviated septum Present, seen ENT.  Surgery offered, unable to afford at this time.   2. Loud snoring OSA and nocturnal hypoxia ruled out.  Likely due to deviated septum.   3. At increased risk of exposure to COVID-19 virus Currently awaiting testing and results.  General Counseling: Thurley verbalizes understanding of the findings of today's phone visit and agrees with plan of treatment. I have discussed any further diagnostic evaluation that may be needed or ordered today. We also reviewed her medications today. she has been encouraged to call the office with any questions or concerns that should arise related to todays  visit.    No orders of the defined types were placed in this encounter.   No orders of the defined types were placed in this encounter.   Time spent:15 Minutes  This patient was seen by Orson Gear AGNP-C in Collaboration with Dr. Devona Konig as a part of collaborative care agreement.  Orson Gear AGNP-C Pulmonary medicine

## 2019-04-13 ENCOUNTER — Ambulatory Visit: Payer: BC Managed Care – PPO | Attending: Internal Medicine

## 2019-04-13 DIAGNOSIS — Z20822 Contact with and (suspected) exposure to covid-19: Secondary | ICD-10-CM

## 2019-04-14 LAB — NOVEL CORONAVIRUS, NAA: SARS-CoV-2, NAA: NOT DETECTED

## 2019-04-15 ENCOUNTER — Telehealth: Payer: Self-pay

## 2019-04-15 NOTE — Telephone Encounter (Signed)
Pt notified of negative COVID-19 results. Understanding verbalized.  Kayla Jimenez   

## 2019-04-18 ENCOUNTER — Ambulatory Visit: Payer: BC Managed Care – PPO | Attending: Internal Medicine

## 2019-04-18 DIAGNOSIS — Z20822 Contact with and (suspected) exposure to covid-19: Secondary | ICD-10-CM

## 2019-04-19 LAB — NOVEL CORONAVIRUS, NAA: SARS-CoV-2, NAA: NOT DETECTED

## 2019-05-23 ENCOUNTER — Other Ambulatory Visit: Payer: Self-pay

## 2019-05-23 DIAGNOSIS — J301 Allergic rhinitis due to pollen: Secondary | ICD-10-CM

## 2019-05-23 MED ORDER — MONTELUKAST SODIUM 10 MG PO TABS: 10.0000 mg | ORAL_TABLET | Freq: Every day | ORAL | 3 refills | Status: DC

## 2019-06-09 ENCOUNTER — Telehealth: Payer: Self-pay

## 2019-06-09 NOTE — Telephone Encounter (Signed)
VM FULL UNABLE TO CONFIRM 06-13-19 OV.

## 2019-06-10 ENCOUNTER — Other Ambulatory Visit: Payer: Self-pay | Admitting: Nurse Practitioner

## 2019-06-10 DIAGNOSIS — F5101 Primary insomnia: Secondary | ICD-10-CM

## 2019-06-10 MED ORDER — ESZOPICLONE 2 MG PO TABS: 2.0000 mg | ORAL_TABLET | Freq: Every day | ORAL | 3 refills | Status: DC

## 2019-06-10 NOTE — Progress Notes (Signed)
Refilled lunesta 2mg  at bedtime per pharmacy request

## 2019-06-13 ENCOUNTER — Ambulatory Visit: Payer: BC Managed Care – PPO | Admitting: Nurse Practitioner

## 2019-07-06 ENCOUNTER — Telehealth: Payer: Self-pay

## 2019-07-06 NOTE — Telephone Encounter (Signed)
Confirmed and screened for 07-11-19 ov.

## 2019-07-11 ENCOUNTER — Encounter: Payer: Self-pay | Admitting: Nurse Practitioner

## 2019-07-11 ENCOUNTER — Ambulatory Visit (INDEPENDENT_AMBULATORY_CARE_PROVIDER_SITE_OTHER): Payer: BC Managed Care – PPO | Admitting: Nurse Practitioner

## 2019-07-11 ENCOUNTER — Other Ambulatory Visit: Payer: Self-pay

## 2019-07-11 VITALS — BP 126/82 | HR 71 | Temp 97.7°F | Resp 16 | Ht 63.0 in | Wt 161.2 lb

## 2019-07-11 DIAGNOSIS — F5101 Primary insomnia: Secondary | ICD-10-CM | POA: Diagnosis not present

## 2019-07-11 DIAGNOSIS — F321 Major depressive disorder, single episode, moderate: Secondary | ICD-10-CM

## 2019-07-11 DIAGNOSIS — J0140 Acute pansinusitis, unspecified: Secondary | ICD-10-CM | POA: Diagnosis not present

## 2019-07-11 MED ORDER — AZITHROMYCIN 250 MG PO TABS: ORAL_TABLET | ORAL | 0 refills | Status: DC

## 2019-07-11 NOTE — Progress Notes (Signed)
[facility] [place], [place] [address]  Internal MEDICINE  Office Visit Note  Patient Name: Kayla Jimenez  [id number]  [id number]  Date of Service: 07/11/2019  Chief Complaint  Patient presents with  . Anxiety  . Sinusitis    left ear ache    The patient is here for follow up visit. She states that her left ear is feeling full and congested. States that she has some sinus pain and congestion as well. Wakes up in the morning with stopped up sinuses and right side of the nose bleeds. She believes this is mediated with weather, pollen, and her dog is shedding very bad. Symptoms have been going on for about a month. She does take singulair everyday. Uses flonase nasal sprays. She has had both of her [place] 19 vaccines. This is updated in her immunization record.        Current Medication: Outpatient Encounter Medications as of 07/11/2019  Medication Sig  . clindamycin-benzoyl peroxide (BENZACLIN) gel Apply topically 2 (two) times daily.  . eszopiclone (LUNESTA) 2 MG TABS tablet Take 1 tablet (2 mg total) by mouth at bedtime.  . montelukast (SINGULAIR) 10 MG tablet Take 1 tablet (10 mg total) by mouth at bedtime.  . sertraline (ZOLOFT) 50 MG tablet TAKE 1.5 TABLETS (75 MG TOTAL) BY MOUTH AT BEDTIME.  . Triamcinolone Acetonide (NASACORT ALLERGY 24HR NA) Place into the nose.  Marland Kitchen azithromycin (ZITHROMAX) 250 MG tablet z-pack - take as directed for 5 days  . [DISCONTINUED] amphetamine-dextroamphetamine (ADDERALL XR) 30 MG 24 hr capsule Take 1 capsule (30 mg total) by mouth every morning. (Patient not taking: Reported on 07/11/2019)   No facility-administered encounter medications on file as of 07/11/2019.    Surgical History: Past Surgical History:  Procedure Laterality Date  . ABDOMINAL HYSTERECTOMY    . ANTERIOR AND POSTERIOR REPAIR  03/16/2012   Procedure: ANTERIOR (CYSTOCELE) AND POSTERIOR REPAIR (RECTOCELE);  Surgeon: [name], MD;  Location: [facility];  Service: Gynecology;  Laterality: N/A;  anterior repair/cysto  . BLADDER SUSPENSION  03/16/2012   Procedure: TRANSVAGINAL TAPE (TVT) PROCEDURE;  Surgeon: [name], MD;  Location: [facility];  Service: Gynecology;  Laterality: N/A;  . BREAST REDUCTION SURGERY  6/04  . CHOLECYSTECTOMY  06/28/14  . CYSTOSCOPY  03/16/2012   Procedure: CYSTOSCOPY;  Surgeon: [name], MD;  Location: [facility];  Service: Gynecology;  Laterality: N/A;  . CYSTOSCOPY  03/16/2017   Procedure: CYSTOSCOPY;  Surgeon: Everett Graff, MD;  Location: [facility];  Service: Gynecology;;  . Murrell Redden & CURRETTAGE/HYSTROSCOPY WITH NOVASURE ABLATION  03/16/2012   Procedure: DILATATION & CURETTAGE/HYSTEROSCOPY WITH NOVASURE ABLATION;  Surgeon: [name], MD;  Location: [facility];  Service: Gynecology;  Laterality: N/A;  . TONSILLECTOMY AND ADENOIDECTOMY    . VAGINAL HYSTERECTOMY Bilateral 03/16/2017   Procedure: HYSTERECTOMY VAGINAL Bilateral Salpingectomy ;  Surgeon: Everett Graff, MD;  Location: [facility];  Service: Gynecology;  Laterality: Bilateral;    Medical History: Past Medical History:  Diagnosis Date  . Anxiety 08/29/10  . BV (bacterial vaginosis) 08/29/10  . Complication of anesthesia 2004   vomited  . Gross hematuria 07/08/2015  . H/O bladder infections   . H/O mumps   . H/O toxoplasmosis   . H/O varicella   . History of kidney stones   . Infections of kidney    several kidney stones, bladder infections  . Kidney disease 09/03/2011   Left ureteral calculus with partial obstruction, passed  06/09/2014. CT did show perinephric stranding.   . Menometrorrhagia 06/26/10  . Plantar fasciitis   . PMS (premenstrual syndrome) 07/10/11  . Post - coital bleeding 08/29/10  . Renal calculus, left   . Sinus infection   . UTI (lower urinary tract infection) 07/08/2015  . Yeast infection     Family History: Family History  Problem Relation Age of Onset  . Cancer Other        breast  . Cancer Mother         cervical  . Depression Mother   . Drug abuse Mother   . Anemia Mother        blood transfusion  . Migraines Mother     Social History   Socioeconomic History  . Marital status: Married    Spouse name: Not on file  . Number of children: Not on file  . Years of education: Not on file  . Highest education level: Not on file  Occupational History  . Not on file  Tobacco Use  . Smoking status: Never Smoker  . Smokeless tobacco: Never Used  Substance and Sexual Activity  . Alcohol use: No  . Drug use: No  . Sexual activity: Yes    Birth control/protection: Surgical    Comment: pt spouse had vasec.   Other Topics Concern  . Not on file  Social History Narrative  . Not on file   Social Determinants of Health   Financial Resource Strain:   . Difficulty of Paying Living Expenses:   Food Insecurity:   . Worried About Charity fundraiser in the Last Year:   . Arboriculturist in the Last Year:   Transportation Needs:   . Film/video editor (Medical):   Marland Kitchen Lack of Transportation (Non-Medical):   Physical Activity:   . Days of Exercise per Week:   . Minutes of Exercise per Session:   Stress:   . Feeling of Stress :   Social Connections:   . Frequency of Communication with Friends and Family:   . Frequency of Social Gatherings with Friends and Family:   . Attends Religious Services:   . Active Member of Clubs or Organizations:   . Attends Archivist Meetings:   Marland Kitchen Marital Status:   Intimate Partner Violence:   . Fear of Current or Ex-Partner:   . Emotionally Abused:   Marland Kitchen Physically Abused:   . Sexually Abused:       Review of Systems  Constitutional: Negative for activity change, chills, fatigue and unexpected weight change.  HENT: Positive for congestion, ear pain, postnasal drip, rhinorrhea, sinus pressure, sinus pain and sneezing. Negative for sore throat.   Eyes: Positive for itching.  Respiratory: Negative for cough, chest tightness, shortness of  breath and wheezing.   Cardiovascular: Negative for chest pain and palpitations.  Gastrointestinal: Negative for abdominal pain, constipation, diarrhea, nausea and vomiting.  Endocrine: Negative for cold intolerance, heat intolerance, polydipsia and polyuria.  Musculoskeletal: Negative for arthralgias, back pain, joint swelling and neck pain.  Skin: Negative for rash.  Allergic/Immunologic: Positive for environmental allergies.  Neurological: Positive for headaches. Negative for tremors and numbness.  Hematological: Negative for adenopathy. Does not bruise/bleed easily.  Psychiatric/Behavioral: Positive for sleep disturbance. Negative for behavioral problems (Depression) and suicidal ideas. The patient is nervous/anxious.     Today's Vitals   07/11/19 1415  BP: 126/82  Pulse: 71  Resp: 16  Temp: 97.7 F (36.5 C)  SpO2: 97%  Weight: 161  lb 3.2 oz (73.1 kg)  Height: 5\' 3"  (1.6 m)   Body mass index is 28.56 kg/m.   Physical Exam Vitals and nursing note reviewed.  Constitutional:      General: She is not in acute distress.    Appearance: Normal appearance. She is well-developed. She is not diaphoretic.  HENT:     Head: Normocephalic and atraumatic.     Right Ear: Tenderness present. Tympanic membrane is erythematous and bulging.     Left Ear: Tenderness present. Tympanic membrane is erythematous and bulging.     Nose: Mucosal edema and congestion present.     Right Turbinates: Swollen.     Left Turbinates: Swollen.     Mouth/Throat:     Pharynx: No oropharyngeal exudate.  Eyes:     Pupils: Pupils are equal, round, and reactive to light.  Neck:     Thyroid: No thyromegaly.     Vascular: No JVD.     Trachea: No tracheal deviation.  Cardiovascular:     Rate and Rhythm: Normal rate and regular rhythm.     Heart sounds: Normal heart sounds. No murmur. No friction rub. No gallop.   Pulmonary:     Effort: Pulmonary effort is normal. No respiratory distress.     Breath sounds:  Normal breath sounds. No wheezing or rales.  Chest:     Chest wall: No tenderness.  Abdominal:     Palpations: Abdomen is soft.  Musculoskeletal:        General: Normal range of motion.     Cervical back: Normal range of motion and neck supple.  Lymphadenopathy:     Cervical: No cervical adenopathy.  Skin:    General: Skin is warm and dry.  Neurological:     Mental Status: She is alert and oriented to person, place, and time.     Cranial Nerves: No cranial nerve deficit.  Psychiatric:        Behavior: Behavior normal.        Thought Content: Thought content normal.        Judgment: Judgment normal.    Assessment/Plan:  1. Acute non-recurrent pansinusitis Start z-pack. Take as directed for 5 days. Rest and increase fluids. Recommend she start taking zyrtec every day in addition to singulair and flonase nasal spray.  - azithromycin (ZITHROMAX) 250 MG tablet; z-pack - take as directed for 5 days  Dispense: 6 tablet; Refill: 0  2. Primary insomnia May continue lunesta as needed and as prescribed   3. Moderate major depression (HCC) continue sertraline as prescribed. Refill as needed   General Counseling: Illyria verbalizes understanding of the findings of todays visit and agrees with plan of treatment. I have discussed any further diagnostic evaluation that may be needed or ordered today. We also reviewed her medications today. she has been encouraged to call the office with any questions or concerns that should arise related to todays visit.   This patient was seen by [place] with Dr Lavera Guise as a part of collaborative care agreement  Meds ordered this encounter  Medications  . azithromycin (ZITHROMAX) 250 MG tablet    Sig: z-pack - take as directed for 5 days    Dispense:  6 tablet    Refill:  0    Order Specific Question:   Supervising Provider    Answer:   Lavera Guise [id number]    Total time spent: 20 Minutes  Time spent includes review of  chart, medications, test  results, and follow up plan with the patient.      Dr Lavera Guise Internal medicine

## 2019-08-17 ENCOUNTER — Other Ambulatory Visit: Payer: Self-pay

## 2019-08-17 DIAGNOSIS — J301 Allergic rhinitis due to pollen: Secondary | ICD-10-CM

## 2019-08-17 MED ORDER — MONTELUKAST SODIUM 10 MG PO TABS: 10.0000 mg | ORAL_TABLET | Freq: Every day | ORAL | 3 refills | Status: DC

## 2019-10-14 ENCOUNTER — Other Ambulatory Visit: Payer: Self-pay

## 2019-10-14 ENCOUNTER — Telehealth: Payer: Self-pay

## 2019-10-14 DIAGNOSIS — F5101 Primary insomnia: Secondary | ICD-10-CM

## 2019-10-14 NOTE — Telephone Encounter (Signed)
Error

## 2019-10-18 ENCOUNTER — Other Ambulatory Visit: Payer: Self-pay | Admitting: Nurse Practitioner

## 2019-10-18 ENCOUNTER — Telehealth: Payer: Self-pay

## 2019-10-18 DIAGNOSIS — F5101 Primary insomnia: Secondary | ICD-10-CM

## 2019-10-18 MED ORDER — ESZOPICLONE 2 MG PO TABS: 2.0000 mg | ORAL_TABLET | Freq: Every day | ORAL | 3 refills | Status: DC

## 2019-10-18 NOTE — Progress Notes (Signed)
Renewed lunesta as requested

## 2019-10-18 NOTE — Telephone Encounter (Signed)
Renewed lunesta as requested and sent to [facility] [address]

## 2019-10-25 ENCOUNTER — Other Ambulatory Visit: Payer: Self-pay

## 2019-10-25 DIAGNOSIS — F321 Major depressive disorder, single episode, moderate: Secondary | ICD-10-CM

## 2019-10-25 MED ORDER — SERTRALINE HCL 50 MG PO TABS: 75.0000 mg | ORAL_TABLET | Freq: Every day | ORAL | 2 refills | Status: DC

## 2019-11-08 ENCOUNTER — Telehealth: Payer: Self-pay

## 2019-11-08 NOTE — Telephone Encounter (Signed)
Lmom to confirm and screen for 11-10-19 ov. 

## 2019-11-10 ENCOUNTER — Other Ambulatory Visit: Payer: Self-pay

## 2019-11-10 ENCOUNTER — Ambulatory Visit (INDEPENDENT_AMBULATORY_CARE_PROVIDER_SITE_OTHER): Payer: BC Managed Care – PPO | Admitting: Hospice and Palliative Medicine

## 2019-11-10 ENCOUNTER — Encounter: Payer: Self-pay | Admitting: Nurse Practitioner

## 2019-11-10 VITALS — BP 128/84 | HR 77 | Temp 98.0°F | Resp 16 | Ht 63.0 in | Wt 156.0 lb

## 2019-11-10 DIAGNOSIS — F321 Major depressive disorder, single episode, moderate: Secondary | ICD-10-CM

## 2019-11-10 DIAGNOSIS — R300 Dysuria: Secondary | ICD-10-CM | POA: Diagnosis not present

## 2019-11-10 DIAGNOSIS — J301 Allergic rhinitis due to pollen: Secondary | ICD-10-CM

## 2019-11-10 DIAGNOSIS — N390 Urinary tract infection, site not specified: Secondary | ICD-10-CM | POA: Diagnosis not present

## 2019-11-10 LAB — POCT URINALYSIS DIPSTICK OB
Glucose, UA: NEGATIVE
Leukocytes, UA: NEGATIVE
Nitrite, UA: NEGATIVE
Spec Grav, UA: 1.02 (ref 1.010–1.025)
Urobilinogen, UA: 0.2 E.U./dL
pH, UA: 6 (ref 5.0–8.0)

## 2019-11-10 MED ORDER — MONTELUKAST SODIUM 10 MG PO TABS: 10.0000 mg | ORAL_TABLET | Freq: Every day | ORAL | 3 refills | Status: DC

## 2019-11-10 MED ORDER — NITROFURANTOIN MONOHYD MACRO 100 MG PO CAPS: 100.0000 mg | ORAL_CAPSULE | Freq: Two times a day (BID) | ORAL | 0 refills | Status: DC

## 2019-11-10 NOTE — Progress Notes (Signed)
[facility] [place], [place] [address]  Internal MEDICINE  Office Visit Note  Patient Name: Kayla Jimenez  [id number]  [id number]  Date of Service: 11/10/2019  Chief Complaint  Patient presents with  . Anxiety    follow up  . Urinary Tract Infection  . Back Pain  . Quality Metric Gaps    Tetanus  shot    HPI LARINE FIELDING presents for follow-up on her depression and anxiety. She reports her anxiety and depression at this time has been well controlled on her sertraline.  Presents today with complaints of bilateral flank pain and dysuria. Denies increase in frequency, denies N/V, denies fevers or chills. Does take cranberry supplements per nephrology recommendations as she is prone to kidney stones in the past. At this time does not feel as though her symptoms are consistent with a kidney stone.  Has had a UTI in the past and symptoms are consistent with previous infections. Reports that she also drinks plenty of fluids daily, encouraged to continue with this practice.   Current Medication: Outpatient Encounter Medications as of 11/10/2019  Medication Sig  . eszopiclone (LUNESTA) 2 MG TABS tablet Take 1 tablet (2 mg total) by mouth at bedtime.  . montelukast (SINGULAIR) 10 MG tablet Take 1 tablet (10 mg total) by mouth at bedtime.  . sertraline (ZOLOFT) 50 MG tablet Take 1.5 tablets (75 mg total) by mouth at bedtime.  . Triamcinolone Acetonide (NASACORT ALLERGY 24HR NA) Place into the nose.  . [DISCONTINUED] azithromycin (ZITHROMAX) 250 MG tablet z-pack - take as directed for 5 days  . [DISCONTINUED] clindamycin-benzoyl peroxide (BENZACLIN) gel Apply topically 2 (two) times daily.  . [DISCONTINUED] montelukast (SINGULAIR) 10 MG tablet Take 1 tablet (10 mg total) by mouth at bedtime.  . nitrofurantoin, macrocrystal-monohydrate, (MACROBID) 100 MG capsule Take 1 capsule (100 mg total) by mouth 2 (two) times daily.   No facility-administered encounter  medications on file as of 11/10/2019.    Surgical History: Past Surgical History:  Procedure Laterality Date  . ABDOMINAL HYSTERECTOMY    . ANTERIOR AND POSTERIOR REPAIR  03/16/2012   Procedure: ANTERIOR (CYSTOCELE) AND POSTERIOR REPAIR (RECTOCELE);  Surgeon: [name], MD;  Location: [facility];  Service: Gynecology;  Laterality: N/A;  anterior repair/cysto  . BLADDER SUSPENSION  03/16/2012   Procedure: TRANSVAGINAL TAPE (TVT) PROCEDURE;  Surgeon: [name], MD;  Location: [facility];  Service: Gynecology;  Laterality: N/A;  . BREAST REDUCTION SURGERY  6/04  . CHOLECYSTECTOMY  06/28/14  . CYSTOSCOPY  03/16/2012   Procedure: CYSTOSCOPY;  Surgeon: [name], MD;  Location: [facility];  Service: Gynecology;  Laterality: N/A;  . CYSTOSCOPY  03/16/2017   Procedure: CYSTOSCOPY;  Surgeon: Everett Graff, MD;  Location: [facility];  Service: Gynecology;;  . Murrell Redden & CURRETTAGE/HYSTROSCOPY WITH NOVASURE ABLATION  03/16/2012   Procedure: DILATATION & CURETTAGE/HYSTEROSCOPY WITH NOVASURE ABLATION;  Surgeon: [name], MD;  Location: [facility];  Service: Gynecology;  Laterality: N/A;  . TONSILLECTOMY AND ADENOIDECTOMY    . VAGINAL HYSTERECTOMY Bilateral 03/16/2017   Procedure: HYSTERECTOMY VAGINAL Bilateral Salpingectomy ;  Surgeon: Everett Graff, MD;  Location: [facility];  Service: Gynecology;  Laterality: Bilateral;    Medical History: Past Medical History:  Diagnosis Date  . Anxiety 08/29/10  . BV (bacterial vaginosis) 08/29/10  . Complication of anesthesia 2004   vomited  . Gross hematuria 07/08/2015  . H/O bladder infections   . H/O mumps   . H/O toxoplasmosis   .  H/O varicella   . History of kidney stones   . Infections of kidney    several kidney stones, bladder infections  . Kidney disease 09/03/2011   Left ureteral calculus with partial obstruction, passed 06/09/2014. CT did show perinephric stranding.   . Menometrorrhagia 06/26/10  . Plantar fasciitis   . PMS  (premenstrual syndrome) 07/10/11  . Post - coital bleeding 08/29/10  . Renal calculus, left   . Sinus infection   . UTI (lower urinary tract infection) 07/08/2015  . Yeast infection     Family History: Family History  Problem Relation Age of Onset  . Cancer Other        breast  . Cancer Mother        cervical  . Depression Mother   . Drug abuse Mother   . Anemia Mother        blood transfusion  . Migraines Mother     Social History   Socioeconomic History  . Marital status: Married    Spouse name: Not on file  . Number of children: Not on file  . Years of education: Not on file  . Highest education level: Not on file  Occupational History  . Not on file  Tobacco Use  . Smoking status: Never Smoker  . Smokeless tobacco: Never Used  Vaping Use  . Vaping Use: Never used  Substance and Sexual Activity  . Alcohol use: No  . Drug use: No  . Sexual activity: Yes    Birth control/protection: Surgical    Comment: pt spouse had vasec.   Other Topics Concern  . Not on file  Social History Narrative  . Not on file   Social Determinants of Health   Financial Resource Strain:   . Difficulty of Paying Living Expenses:   Food Insecurity:   . Worried About Charity fundraiser in the Last Year:   . Arboriculturist in the Last Year:   Transportation Needs:   . Film/video editor (Medical):   Marland Kitchen Lack of Transportation (Non-Medical):   Physical Activity:   . Days of Exercise per Week:   . Minutes of Exercise per Session:   Stress:   . Feeling of Stress :   Social Connections:   . Frequency of Communication with Friends and Family:   . Frequency of Social Gatherings with Friends and Family:   . Attends Religious Services:   . Active Member of Clubs or Organizations:   . Attends Archivist Meetings:   Marland Kitchen Marital Status:   Intimate Partner Violence:   . Fear of Current or Ex-Partner:   . Emotionally Abused:   Marland Kitchen Physically Abused:   . Sexually Abused:        Review of Systems  Constitutional: Negative.        For chills, fever, fatigue.  HENT: Negative.        For sinus pain, sinus pressure, sore throat, trouble swallowing.  Eyes: Negative.        For changes in vision or visual disturbances.  Respiratory: Negative.        For chest tightness, cough, shortness of breath, wheezing.  Cardiovascular: Negative.        For chest pain, ankle swelling, palpitations.  Gastrointestinal: Negative.        For abdominal pain, constipation, nausea, vomiting, diarrhea.  Endocrine: Negative.        For polydipsia, polyphagia, polyuria.  Genitourinary: Positive for dysuria and flank pain.  Negative for dysuria, flank pain, hematuria, increased frequency, urgency.  Musculoskeletal:       For arthralgias, myalgias, back pain, neck pain, gait disturbances.  Skin: Negative.        For rash, wound.  Allergic/Immunologic: Negative.   Neurological: Negative.        For dizziness, headaches, tremors, weakness.  Hematological: Negative.   Psychiatric/Behavioral: Negative.        Confusion, depression, anxiety, sleep disturbances.    Vital Signs: BP 128/84   Pulse 77   Temp 98 F (36.7 C)   Resp 16   Ht 5\' 3"  (1.6 m)   Wt 156 lb (70.8 kg)   SpO2 99%   BMI 27.63 kg/m    Physical Exam Constitutional:      Appearance: Normal appearance.  HENT:     Nose: Nose normal.     Mouth/Throat:     Mouth: Mucous membranes are moist.     Pharynx: Oropharynx is clear.  Cardiovascular:     Rate and Rhythm: Normal rate and regular rhythm.     Pulses: Normal pulses.     Heart sounds: Normal heart sounds.  Pulmonary:     Effort: Pulmonary effort is normal.     Breath sounds: Normal breath sounds.  Abdominal:     General: Abdomen is flat.     Palpations: Abdomen is soft.  Musculoskeletal:        General: Normal range of motion.     Cervical back: Normal range of motion.  Skin:    General: Skin is warm.  Neurological:     General: No  focal deficit present.     Mental Status: She is alert and oriented to person, place, and time. Mental status is at baseline.  Psychiatric:        Mood and Affect: Mood normal.        Behavior: Behavior normal.        Thought Content: Thought content normal.     Assessment/Plan: 1. Dysuria Complaints of dysuria and flank pain. Will start Macrobid at this time and await culture results. Will make changes if necessary. - POC Urinalysis Dipstick OB - nitrofurantoin, macrocrystal-monohydrate, (MACROBID) 100 MG capsule; Take 1 capsule (100 mg total) by mouth 2 (two) times daily.  Dispense: 10 capsule; Refill: 0  2. Allergic rhinitis due to pollen, unspecified seasonality Stable at this time, will continue to monitor. - montelukast (SINGULAIR) 10 MG tablet; Take 1 tablet (10 mg total) by mouth at bedtime.  Dispense: 30 tablet; Refill: 3  3. Urinary tract infection without hematuria, site unspecified - CULTURE, URINE COMPREHENSIVE  4. Moderate major depression Stable at this time on Zoloft, continue to monitor.  General Counseling: Samra verbalizes understanding of the findings of todays visit and agrees with plan of treatment. I have discussed any further diagnostic evaluation that may be needed or ordered today. We also reviewed her medications today. she has been encouraged to call the office with any questions or concerns that should arise related to todays visit.    Orders Placed This Encounter  Procedures  . CULTURE, URINE COMPREHENSIVE  . POC Urinalysis Dipstick OB    Meds ordered this encounter  Medications  . nitrofurantoin, macrocrystal-monohydrate, (MACROBID) 100 MG capsule    Sig: Take 1 capsule (100 mg total) by mouth 2 (two) times daily.    Dispense:  10 capsule    Refill:  0  . montelukast (SINGULAIR) 10 MG tablet    Sig: Take 1 tablet (10  mg total) by mouth at bedtime.    Dispense:  30 tablet    Refill:  3    Time spent: 20 Minutes   This patient was seen by  Theodoro Grist AGNP-C in Collaboration with Dr Lavera Guise as a part of collaborative care agreement     Tanna Furry. Ayuub Penley AGNP-C Internal medicine

## 2019-11-15 LAB — CULTURE, URINE COMPREHENSIVE

## 2019-12-01 ENCOUNTER — Telehealth: Payer: Self-pay

## 2019-12-01 NOTE — Telephone Encounter (Signed)
Authorization approved for Eszopiclone 2mg  OR Tabs from 11/30/2019 through 11/30/2022 [facility]

## 2019-12-09 ENCOUNTER — Telehealth: Payer: Self-pay

## 2019-12-09 NOTE — Telephone Encounter (Signed)
Authorization approved for eszopiclone 2MG  tablets [facility]

## 2019-12-24 ENCOUNTER — Other Ambulatory Visit: Payer: Self-pay

## 2019-12-24 ENCOUNTER — Ambulatory Visit
Admission: RE | Admit: 2019-12-24 | Discharge: 2019-12-24 | Disposition: A | Discharge status: Home / Self Care | Payer: BC Managed Care – PPO | Source: Ambulatory Visit | Attending: Emergency Medicine | Admitting: Emergency Medicine

## 2019-12-24 VITALS — BP 125/82 | HR 78 | Temp 98.4°F | Resp 20

## 2019-12-24 DIAGNOSIS — J018 Other acute sinusitis: Secondary | ICD-10-CM

## 2019-12-24 DIAGNOSIS — Z1152 Encounter for screening for COVID-19: Secondary | ICD-10-CM

## 2019-12-24 DIAGNOSIS — Z20822 Contact with and (suspected) exposure to covid-19: Secondary | ICD-10-CM

## 2019-12-24 DIAGNOSIS — R059 Cough, unspecified: Secondary | ICD-10-CM

## 2019-12-24 MED ORDER — BENZONATATE 100 MG PO CAPS: 100.0000 mg | ORAL_CAPSULE | Freq: Three times a day (TID) | ORAL | 0 refills | Status: DC

## 2019-12-24 MED ORDER — AMOXICILLIN-POT CLAVULANATE 875-125 MG PO TABS: 1.0000 | ORAL_TABLET | Freq: Two times a day (BID) | ORAL | 0 refills | Status: AC

## 2019-12-24 NOTE — Discharge Instructions (Signed)
COVID testing ordered.  It will take between 5-7 days for test results.  Someone will contact you regarding abnormal results.    In the meantime: You should remain isolated in your home for 10 days from symptom onset AND greater than 72 hours after symptoms resolution (absence of fever without the use of fever-reducing medication and improvement in respiratory symptoms), whichever is longer Get plenty of rest and push fluids Tessalon Perles prescribed for cough Augmentin for sinus infection.   Use OTC zyrtec for nasal congestion, runny nose, and/or sore throat Use OTC flonase for nasal congestion and runny nose Use medications daily for symptom relief Use OTC medications like ibuprofen or tylenol as needed fever or pain Call or go to the ED if you have any new or worsening symptoms such as fever, worsening cough, shortness of breath, chest tightness, chest pain, turning blue, changes in mental status, etc..Marland Kitchen

## 2019-12-24 NOTE — ED Provider Notes (Signed)
[place]   [id number] 12/24/19 Arrival Time: 0051   CC: COVID symptoms  SUBJECTIVE: History from: patient.  Kayla Jimenez is a 44 y.o. female who presents with sinus congestion and cough x 4-5 days.  Denies sick exposure to COVID, flu or strep.  Did a home COVID test that was negative.  Has tried OTC medications without relief.  Symptoms are made worse at night.  Reports previous symptoms in the past with sinus infection.   Denies fever, chills, sore throat, SOB, wheezing, chest pain, nausea, changes in bowel or bladder habits.    ROS: As per HPI.  All other pertinent ROS negative.     Past Medical History:  Diagnosis Date  . Anxiety 08/29/10  . BV (bacterial vaginosis) 08/29/10  . Complication of anesthesia 2004   vomited  . Gross hematuria 07/08/2015  . H/O bladder infections   . H/O mumps   . H/O toxoplasmosis   . H/O varicella   . History of kidney stones   . Infections of kidney    several kidney stones, bladder infections  . Kidney disease 09/03/2011   Left ureteral calculus with partial obstruction, passed 06/09/2014. CT did show perinephric stranding.   . Menometrorrhagia 06/26/10  . Plantar fasciitis   . PMS (premenstrual syndrome) 07/10/11  . Post - coital bleeding 08/29/10  . Renal calculus, left   . Sinus infection   . UTI (lower urinary tract infection) 07/08/2015  . Yeast infection    Past Surgical History:  Procedure Laterality Date  . ABDOMINAL HYSTERECTOMY    . ANTERIOR AND POSTERIOR REPAIR  03/16/2012   Procedure: ANTERIOR (CYSTOCELE) AND POSTERIOR REPAIR (RECTOCELE);  Surgeon: [name], MD;  Location: [facility];  Service: Gynecology;  Laterality: N/A;  anterior repair/cysto  . BLADDER SUSPENSION  03/16/2012   Procedure: TRANSVAGINAL TAPE (TVT) PROCEDURE;  Surgeon: [name], MD;  Location: [facility];  Service: Gynecology;  Laterality: N/A;  . BREAST REDUCTION SURGERY  6/04  . CHOLECYSTECTOMY  06/28/14  . CYSTOSCOPY  03/16/2012    Procedure: CYSTOSCOPY;  Surgeon: [name], MD;  Location: [facility];  Service: Gynecology;  Laterality: N/A;  . CYSTOSCOPY  03/16/2017   Procedure: CYSTOSCOPY;  Surgeon: Everett Graff, MD;  Location: [facility];  Service: Gynecology;;  . Murrell Redden & CURRETTAGE/HYSTROSCOPY WITH NOVASURE ABLATION  03/16/2012   Procedure: DILATATION & CURETTAGE/HYSTEROSCOPY WITH NOVASURE ABLATION;  Surgeon: [name], MD;  Location: [facility];  Service: Gynecology;  Laterality: N/A;  . TONSILLECTOMY AND ADENOIDECTOMY    . VAGINAL HYSTERECTOMY Bilateral 03/16/2017   Procedure: HYSTERECTOMY VAGINAL Bilateral Salpingectomy ;  Surgeon: Everett Graff, MD;  Location: [facility];  Service: Gynecology;  Laterality: Bilateral;   Allergies  Allergen Reactions  . Percocet [Oxycodone-Acetaminophen] Itching  . Sulfa Antibiotics Itching  . Sulfonamide Derivatives     REACTION: Eyes Itch   No current facility-administered medications on file prior to encounter.   Current Outpatient Medications on File Prior to Encounter  Medication Sig Dispense Refill  . eszopiclone (LUNESTA) 2 MG TABS tablet Take 1 tablet (2 mg total) by mouth at bedtime. 30 tablet 3  . montelukast (SINGULAIR) 10 MG tablet Take 1 tablet (10 mg total) by mouth at bedtime. 30 tablet 3  . nitrofurantoin, macrocrystal-monohydrate, (MACROBID) 100 MG capsule Take 1 capsule (100 mg total) by mouth 2 (two) times daily. 10 capsule 0  . sertraline (ZOLOFT) 50 MG tablet Take 1.5 tablets (75 mg total) by mouth at bedtime. 135 tablet  2  . Triamcinolone Acetonide (NASACORT ALLERGY 24HR NA) Place into the nose.     Social History   Socioeconomic History  . Marital status: Married    Spouse name: Not on file  . Number of children: Not on file  . Years of education: Not on file  . Highest education level: Not on file  Occupational History  . Not on file  Tobacco Use  . Smoking status: Never Smoker  . Smokeless tobacco: Never Used  Vaping Use  . Vaping  Use: Never used  Substance and Sexual Activity  . Alcohol use: No  . Drug use: No  . Sexual activity: Yes    Birth control/protection: Surgical    Comment: pt spouse had vasec.   Other Topics Concern  . Not on file  Social History Narrative  . Not on file   Social Determinants of Health   Financial Resource Strain:   . Difficulty of Paying Living Expenses: Not on file  Food Insecurity:   . Worried About Charity fundraiser in the Last Year: Not on file  . Ran Out of Food in the Last Year: Not on file  Transportation Needs:   . Lack of Transportation (Medical): Not on file  . Lack of Transportation (Non-Medical): Not on file  Physical Activity:   . Days of Exercise per Week: Not on file  . Minutes of Exercise per Session: Not on file  Stress:   . Feeling of Stress : Not on file  Social Connections:   . Frequency of Communication with Friends and Family: Not on file  . Frequency of Social Gatherings with Friends and Family: Not on file  . Attends Religious Services: Not on file  . Active Member of Clubs or Organizations: Not on file  . Attends Archivist Meetings: Not on file  . Marital Status: Not on file  Intimate Partner Violence:   . Fear of Current or Ex-Partner: Not on file  . Emotionally Abused: Not on file  . Physically Abused: Not on file  . Sexually Abused: Not on file   Family History  Problem Relation Age of Onset  . Cancer Other        breast  . Cancer Mother        cervical  . Depression Mother   . Drug abuse Mother   . Anemia Mother        blood transfusion  . Migraines Mother     OBJECTIVE:  Vitals:   12/24/19 1228  BP: 125/82  Pulse: 78  Resp: 20  Temp: 98.4 F (36.9 C)  SpO2: 97%     General appearance: alert; appears fatigued, but nontoxic; speaking in full sentences and tolerating own secretions HEENT: NCAT; Ears: EACs clear, TMs pearly gray; Eyes: PERRL.  EOM grossly intact. Sinuses: TTP; Nose: nares patent without  rhinorrhea, Throat: oropharynx clear, tonsils non erythematous or enlarged, uvula midline  Neck: supple without LAD Lungs: unlabored respirations, symmetrical air entry; cough: absent; no respiratory distress; CTAB Heart: regular rate and rhythm.   Skin: warm and dry Psychological: alert and cooperative; normal mood and affect  ASSESSMENT & PLAN:  1. Encounter for screening for COVID-19   2. Acute non-recurrent sinusitis of other sinus   3. Cough   4. Suspected COVID-19 virus infection     Meds ordered this encounter  Medications  . amoxicillin-clavulanate (AUGMENTIN) 875-125 MG tablet    Sig: Take 1 tablet by mouth every 12 (twelve) hours for 10  days.    Dispense:  20 tablet    Refill:  0    Order Specific Question:   Supervising Provider    Answer:   Raylene Everts [[id number]]  . benzonatate (TESSALON) 100 MG capsule    Sig: Take 1 capsule (100 mg total) by mouth every 8 (eight) hours.    Dispense:  21 capsule    Refill:  0    Order Specific Question:   Supervising Provider    Answer:   Raylene Everts [[id number]]     COVID testing ordered.  It will take between 5-7 days for test results.  Someone will contact you regarding abnormal results.    In the meantime: You should remain isolated in your home for 10 days from symptom onset AND greater than 72 hours after symptoms resolution (absence of fever without the use of fever-reducing medication and improvement in respiratory symptoms), whichever is longer Get plenty of rest and push fluids Tessalon Perles prescribed for cough Augmentin for sinus infection.   Use OTC zyrtec for nasal congestion, runny nose, and/or sore throat Use OTC flonase for nasal congestion and runny nose Use medications daily for symptom relief Use OTC medications like ibuprofen or tylenol as needed fever or pain Call or go to the ED if you have any new or worsening symptoms such as fever, worsening cough, shortness of breath, chest tightness,  chest pain, turning blue, changes in mental status, etc...   Reviewed expectations re: course of current medical issues. Questions answered. Outlined signs and symptoms indicating need for more acute intervention. Patient verbalized understanding. After Visit Summary given.         Lestine Box, PA-C 12/24/19 1244

## 2019-12-24 NOTE — ED Triage Notes (Signed)
Pt presents with cough and nasal congestion since wednesday

## 2019-12-26 LAB — NOVEL CORONAVIRUS, NAA: SARS-CoV-2, NAA: NOT DETECTED

## 2019-12-26 LAB — SARS-COV-2, NAA 2 DAY TAT

## 2020-02-10 ENCOUNTER — Other Ambulatory Visit: Payer: Self-pay | Admitting: Internal Medicine

## 2020-02-10 DIAGNOSIS — J301 Allergic rhinitis due to pollen: Secondary | ICD-10-CM

## 2020-02-13 ENCOUNTER — Ambulatory Visit: Payer: BC Managed Care – PPO | Admitting: Nurse Practitioner

## 2020-02-13 ENCOUNTER — Encounter: Payer: Self-pay | Admitting: Podiatry

## 2020-02-13 ENCOUNTER — Ambulatory Visit (INDEPENDENT_AMBULATORY_CARE_PROVIDER_SITE_OTHER): Payer: BC Managed Care – PPO

## 2020-02-13 ENCOUNTER — Ambulatory Visit: Payer: BC Managed Care – PPO | Admitting: Podiatry

## 2020-02-13 ENCOUNTER — Other Ambulatory Visit: Payer: Self-pay

## 2020-02-13 DIAGNOSIS — M7752 Other enthesopathy of left foot: Secondary | ICD-10-CM

## 2020-02-13 NOTE — Progress Notes (Signed)
She presents today chief complaint of pain by the fifth metatarsal base x6 weeks left foot.  She went to [facility] urgent care over the weekend they said there is no fracture placed her in her surgical Darco shoe.  Objective: Vital signs are stable alert oriented x3.  There is no erythema edema cellulitis drainage or odor she has some fluctuance on palpation of the fifth metatarsal base plantarly she has some tenderness on palpation of the fifth met base.  As well as some pain on palpation of the peroneus brevis tendon at its insertion.  Radiographs taken today demonstrate an osseously mature female with some soft tissue increase in density around the fifth metatarsal base particularly on the plantar aspect.  Assessment: Bursitis of the fifth metatarsal base left cannot rule out insertional peroneal brevis tendinitis.  Plan: Injected the bursa today with 2 mg of dexamethasone and local anesthetic continue to wear the Darco shoe for the next couple of days and then try to transition to a new pair of tennis shoes.

## 2020-02-27 ENCOUNTER — Other Ambulatory Visit: Payer: Self-pay

## 2020-02-27 ENCOUNTER — Ambulatory Visit: Payer: BC Managed Care – PPO | Admitting: Hospice and Palliative Medicine

## 2020-02-27 ENCOUNTER — Encounter: Payer: Self-pay | Admitting: Hospice and Palliative Medicine

## 2020-02-27 DIAGNOSIS — Z23 Encounter for immunization: Secondary | ICD-10-CM

## 2020-02-27 DIAGNOSIS — Z0001 Encounter for general adult medical examination with abnormal findings: Secondary | ICD-10-CM

## 2020-02-27 DIAGNOSIS — G4734 Idiopathic sleep related nonobstructive alveolar hypoventilation: Secondary | ICD-10-CM | POA: Diagnosis not present

## 2020-02-27 DIAGNOSIS — F321 Major depressive disorder, single episode, moderate: Secondary | ICD-10-CM

## 2020-02-27 DIAGNOSIS — R5383 Other fatigue: Secondary | ICD-10-CM

## 2020-02-27 DIAGNOSIS — F5101 Primary insomnia: Secondary | ICD-10-CM | POA: Diagnosis not present

## 2020-02-27 MED ORDER — BUPROPION HCL ER (SR) 100 MG PO TB12: 100.0000 mg | ORAL_TABLET | Freq: Every day | ORAL | 0 refills | Status: DC

## 2020-02-27 MED ORDER — ESZOPICLONE 2 MG PO TABS: 2.0000 mg | ORAL_TABLET | Freq: Every day | ORAL | 3 refills | Status: DC

## 2020-02-27 NOTE — Progress Notes (Signed)
[facility] [place], [place] [employer] [address]  Internal MEDICINE  Office Visit Note  Patient Name: Kayla Jimenez  [id number]  [id number]  Date of Service: 03/03/2020  Chief Complaint  Patient presents with  . Follow-up  . Anxiety  . policy update form    received    HPI Patient is here for routine follow-up Followed for anxiety and depression, not sure if symptoms are well controlled at this time--she reports lack of energy, big change since last visit Reports that for last few months after work she goes home and does not get off of the couch--no energy and lacks the interest to get off of couch to cook dinner or take care of other household needs Husband has mentioned that he is concerned because this is unlike her--usually very active She reports she is sleeping well at night--but still has a lack of energy and feels fatigued everyday Has had sleep study in 2020-[facility] 3, had several episodes of desaturations, lowest SpO2 87%  Current Medication: Outpatient Encounter Medications as of 02/27/2020  Medication Sig  . eszopiclone (LUNESTA) 2 MG TABS tablet Take 1 tablet (2 mg total) by mouth at bedtime.  . meloxicam (MOBIC) 7.5 MG tablet Take 7.5 mg by mouth 2 (two) times daily.  . montelukast (SINGULAIR) 10 MG tablet TAKE 1 TABLET BY MOUTH EVERYDAY AT BEDTIME  . sertraline (ZOLOFT) 50 MG tablet Take 1.5 tablets (75 mg total) by mouth at bedtime.  . [DISCONTINUED] eszopiclone (LUNESTA) 2 MG TABS tablet Take 1 tablet (2 mg total) by mouth at bedtime.  Marland Kitchen buPROPion (WELLBUTRIN SR) 100 MG 12 hr tablet Take 1 tablet (100 mg total) by mouth daily.  . [DISCONTINUED] albuterol (VENTOLIN HFA) 108 (90 Base) MCG/ACT inhaler SMARTSIG:1.5 Inhalation Via Inhaler Every 4-6 Hours PRN (Patient not taking: Reported on 02/27/2020)  . [DISCONTINUED] benzonatate (TESSALON) 100 MG capsule Take 1 capsule (100 mg total) by mouth every 8 (eight) hours. (Patient not taking: Reported on  02/27/2020)  . [DISCONTINUED] Triamcinolone Acetonide (NASACORT ALLERGY 24HR NA) Place into the nose. (Patient not taking: Reported on 02/27/2020)   No facility-administered encounter medications on file as of 02/27/2020.    Surgical History: Past Surgical History:  Procedure Laterality Date  . ABDOMINAL HYSTERECTOMY    . ANTERIOR AND POSTERIOR REPAIR  03/16/2012   Procedure: ANTERIOR (CYSTOCELE) AND POSTERIOR REPAIR (RECTOCELE);  Surgeon: [name], MD;  Location: [facility];  Service: Gynecology;  Laterality: N/A;  anterior repair/cysto  . BLADDER SUSPENSION  03/16/2012   Procedure: TRANSVAGINAL TAPE (TVT) PROCEDURE;  Surgeon: [name], MD;  Location: [facility];  Service: Gynecology;  Laterality: N/A;  . BREAST REDUCTION SURGERY  6/04  . CHOLECYSTECTOMY  06/28/14  . CYSTOSCOPY  03/16/2012   Procedure: CYSTOSCOPY;  Surgeon: [name], MD;  Location: [facility];  Service: Gynecology;  Laterality: N/A;  . CYSTOSCOPY  03/16/2017   Procedure: CYSTOSCOPY;  Surgeon: Everett Graff, MD;  Location: [facility];  Service: Gynecology;;  . Murrell Redden & CURRETTAGE/HYSTROSCOPY WITH NOVASURE ABLATION  03/16/2012   Procedure: DILATATION & CURETTAGE/HYSTEROSCOPY WITH NOVASURE ABLATION;  Surgeon: [name], MD;  Location: [facility];  Service: Gynecology;  Laterality: N/A;  . TONSILLECTOMY AND ADENOIDECTOMY    . VAGINAL HYSTERECTOMY Bilateral 03/16/2017   Procedure: HYSTERECTOMY VAGINAL Bilateral Salpingectomy ;  Surgeon: Everett Graff, MD;  Location: [facility];  Service: Gynecology;  Laterality: Bilateral;    Medical History: Past Medical History:  Diagnosis Date  . Anxiety 08/29/10  .  Bursitis    left foot  . BV (bacterial vaginosis) 08/29/10  . Complication of anesthesia 2004   vomited  . Gross hematuria 07/08/2015  . H/O bladder infections   . H/O mumps   . H/O toxoplasmosis   . H/O varicella   . History of kidney stones   . Infections of kidney    several kidney stones, bladder  infections  . Kidney disease 09/03/2011   Left ureteral calculus with partial obstruction, passed 06/09/2014. CT did show perinephric stranding.   . Menometrorrhagia 06/26/10  . Plantar fasciitis   . PMS (premenstrual syndrome) 07/10/11  . Post - coital bleeding 08/29/10  . Renal calculus, left   . Sinus infection   . UTI (lower urinary tract infection) 07/08/2015  . Yeast infection     Family History: Family History  Problem Relation Age of Onset  . Cancer Other        breast  . Cancer Mother        cervical  . Depression Mother   . Drug abuse Mother   . Anemia Mother        blood transfusion  . Migraines Mother     Social History   Socioeconomic History  . Marital status: Married    Spouse name: Not on file  . Number of children: Not on file  . Years of education: Not on file  . Highest education level: Not on file  Occupational History  . Not on file  Tobacco Use  . Smoking status: Never Smoker  . Smokeless tobacco: Never Used  Vaping Use  . Vaping Use: Never used  Substance and Sexual Activity  . Alcohol use: No  . Drug use: No  . Sexual activity: Yes    Birth control/protection: Surgical    Comment: pt spouse had vasec.   Other Topics Concern  . Not on file  Social History Narrative  . Not on file   Social Determinants of Health   Financial Resource Strain:   . Difficulty of Paying Living Expenses: Not on file  Food Insecurity:   . Worried About Charity fundraiser in the Last Year: Not on file  . Ran Out of Food in the Last Year: Not on file  Transportation Needs:   . Lack of Transportation (Medical): Not on file  . Lack of Transportation (Non-Medical): Not on file  Physical Activity:   . Days of Exercise per Week: Not on file  . Minutes of Exercise per Session: Not on file  Stress:   . Feeling of Stress : Not on file  Social Connections:   . Frequency of Communication with Friends and Family: Not on file  . Frequency of Social Gatherings with  Friends and Family: Not on file  . Attends Religious Services: Not on file  . Active Member of Clubs or Organizations: Not on file  . Attends Archivist Meetings: Not on file  . Marital Status: Not on file  Intimate Partner Violence:   . Fear of Current or Ex-Partner: Not on file  . Emotionally Abused: Not on file  . Physically Abused: Not on file  . Sexually Abused: Not on file      Review of Systems  Constitutional: Positive for activity change and fatigue. Negative for chills and diaphoresis.  HENT: Negative for ear pain, postnasal drip and sinus pressure.   Eyes: Negative for photophobia, discharge, redness, itching and visual disturbance.  Respiratory: Negative for cough, shortness of breath and wheezing.  Cardiovascular: Negative for chest pain, palpitations and leg swelling.  Gastrointestinal: Negative for abdominal pain, constipation, diarrhea, nausea and vomiting.  Genitourinary: Negative for dysuria and flank pain.  Musculoskeletal: Negative for arthralgias, back pain, gait problem and neck pain.  Skin: Negative for color change.  Allergic/Immunologic: Negative for environmental allergies and food allergies.  Neurological: Negative for dizziness and headaches.  Hematological: Does not bruise/bleed easily.  Psychiatric/Behavioral: Negative for agitation, behavioral problems (depression) and hallucinations.       Lack of energy or motivation    Vital Signs: BP 136/78   Pulse 80   Temp 97.7 F (36.5 C)   Resp 16   Ht 5\' 3"  (1.6 m)   Wt 153 lb 12.8 oz (69.8 kg)   SpO2 97%   BMI 27.24 kg/m    Physical Exam Constitutional:      Appearance: Normal appearance. She is normal weight.  Cardiovascular:     Rate and Rhythm: Normal rate and regular rhythm.     Pulses: Normal pulses.     Heart sounds: Normal heart sounds.  Pulmonary:     Effort: Pulmonary effort is normal.     Breath sounds: Normal breath sounds.  Abdominal:     General: Abdomen is flat.   Musculoskeletal:        General: Normal range of motion.     Cervical back: Normal range of motion.  Skin:    General: Skin is warm.  Neurological:     General: No focal deficit present.     Mental Status: She is alert and oriented to person, place, and time. Mental status is at baseline.  Psychiatric:        Mood and Affect: Mood normal.        Behavior: Behavior normal.        Thought Content: Thought content normal.     Assessment/Plan: 1. Nocturnal hypoxia Will review for severity of hypoxia--may need oxygen therapy or retesting in lab for OSA - Pulse oximetry, overnight; Future  2. Primary insomnia Requesting refills today, insomnia remains well controlled on Lunesta - eszopiclone (LUNESTA) 2 MG TABS tablet; Take 1 tablet (2 mg total) by mouth at bedtime.  Dispense: 30 tablet; Refill: 3  3. Moderate major depression ([place] Requesting refills--may need to adjust therapy if symptoms persist Follow-up after testing - buPROPion (WELLBUTRIN SR) 100 MG 12 hr tablet; Take 1 tablet (100 mg total) by mouth daily.  Dispense: 90 tablet; Refill: 0  4. Flu vaccine need - Flu Vaccine MDCK QUAD PF  5. Fatigue, unspecified type Will review labs and overnight oximetry for possible cause of fatigue Will adjust plan of care as needed - Pulse oximetry, overnight; Future - CBC w/Diff/Platelet - Comprehensive Metabolic Panel (CMET) - TSH + free T4 - Vitamin D (25 hydroxy) - B12  6. Encounter for routine adult health examination with abnormal findings Labs ordered for upcoming CPE in January - CBC w/Diff/Platelet - Comprehensive Metabolic Panel (CMET) - TSH + free T4 - Lipid Panel With LDL/HDL Ratio - Vitamin D (25 hydroxy) - B12  General Counseling: Shiza verbalizes understanding of the findings of todays visit and agrees with plan of treatment. I have discussed any further diagnostic evaluation that may be needed or ordered today. We also reviewed her medications today. she has  been encouraged to call the office with any questions or concerns that should arise related to todays visit.    Orders Placed This Encounter  Procedures  . Flu Vaccine MDCK QUAD PF  .  CBC w/Diff/Platelet  . Comprehensive Metabolic Panel (CMET)  . TSH + free T4  . Lipid Panel With LDL/HDL Ratio  . Vitamin D (25 hydroxy)  . B12  . Pulse oximetry, overnight    Meds ordered this encounter  Medications  . buPROPion (WELLBUTRIN SR) 100 MG 12 hr tablet    Sig: Take 1 tablet (100 mg total) by mouth daily.    Dispense:  90 tablet    Refill:  0  . eszopiclone (LUNESTA) 2 MG TABS tablet    Sig: Take 1 tablet (2 mg total) by mouth at bedtime.    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 Cavon Nicolls AGNP-C Internal medicine

## 2020-02-28 ENCOUNTER — Telehealth: Payer: Self-pay

## 2020-02-28 NOTE — Telephone Encounter (Signed)
Gave [facility] orders RX for overnight ox test. Kayla Jimenez

## 2020-02-29 ENCOUNTER — Other Ambulatory Visit: Payer: Self-pay

## 2020-02-29 LAB — CBC WITH DIFFERENTIAL/PLATELET
Basophils Absolute: 0.1 10*3/uL (ref 0.0–0.2)
Basos: 1 %
EOS (ABSOLUTE): 0.1 10*3/uL (ref 0.0–0.4)
Eos: 1 %
Hematocrit: 43.3 % (ref 34.0–46.6)
Hemoglobin: 14.7 g/dL (ref 11.1–15.9)
Immature Grans (Abs): 0 10*3/uL (ref 0.0–0.1)
Immature Granulocytes: 0 %
Lymphocytes Absolute: 2.3 10*3/uL (ref 0.7–3.1)
Lymphs: 22 %
MCH: 31.7 pg (ref 26.6–33.0)
MCHC: 33.9 g/dL (ref 31.5–35.7)
MCV: 94 fL (ref 79–97)
Monocytes Absolute: 0.7 10*3/uL (ref 0.1–0.9)
Monocytes: 7 %
Neutrophils Absolute: 7 10*3/uL (ref 1.4–7.0)
Neutrophils: 69 %
Platelets: 316 10*3/uL (ref 150–450)
RBC: 4.63 x10E6/uL (ref 3.77–5.28)
RDW: 11 % — ABNORMAL LOW (ref 11.7–15.4)
WBC: 10.2 10*3/uL (ref 3.4–10.8)

## 2020-02-29 LAB — COMPREHENSIVE METABOLIC PANEL
ALT: 8 IU/L (ref 0–32)
AST: 10 IU/L (ref 0–40)
Albumin/Globulin Ratio: 1.9 (ref 1.2–2.2)
Albumin: 4.5 g/dL (ref 3.8–4.8)
Alkaline Phosphatase: 81 IU/L (ref 44–121)
BUN/Creatinine Ratio: 17 (ref 9–23)
BUN: 13 mg/dL (ref 6–24)
Bilirubin Total: 0.5 mg/dL (ref 0.0–1.2)
CO2: 21 mmol/L (ref 20–29)
Calcium: 9.3 mg/dL (ref 8.7–10.2)
Chloride: 104 mmol/L (ref 96–106)
Creatinine, Ser: 0.76 mg/dL (ref 0.57–1.00)
GFR calc Af Amer: 110 mL/min/{1.73_m2} (ref >59)
GFR calc non Af Amer: 96 mL/min/{1.73_m2} (ref >59)
Globulin, Total: 2.4 g/dL (ref 1.5–4.5)
Glucose: 101 mg/dL — ABNORMAL HIGH (ref 65–99)
Potassium: 4.6 mmol/L (ref 3.5–5.2)
Sodium: 139 mmol/L (ref 134–144)
Total Protein: 6.9 g/dL (ref 6.0–8.5)

## 2020-02-29 LAB — LIPID PANEL WITH LDL/HDL RATIO
Cholesterol, Total: 230 mg/dL — ABNORMAL HIGH (ref 100–199)
HDL: 39 mg/dL — ABNORMAL LOW (ref >39)
LDL Chol Calc (NIH): 165 mg/dL — ABNORMAL HIGH (ref 0–99)
LDL/HDL Ratio: 4.2 ratio — ABNORMAL HIGH (ref 0.0–3.2)
Triglycerides: 142 mg/dL (ref 0–149)
VLDL Cholesterol Cal: 26 mg/dL (ref 5–40)

## 2020-02-29 LAB — TSH+FREE T4
Free T4: 0.96 ng/dL (ref 0.82–1.77)
TSH: 1.47 u[IU]/mL (ref 0.450–4.500)

## 2020-02-29 LAB — VITAMIN B12: Vitamin B-12: 220 pg/mL — ABNORMAL LOW (ref 232–1245)

## 2020-02-29 LAB — VITAMIN D 25 HYDROXY (VIT D DEFICIENCY, FRACTURES): Vit D, 25-Hydroxy: 19.4 ng/mL — ABNORMAL LOW (ref 30.0–100.0)

## 2020-02-29 MED ORDER — INSULIN SYRINGE 29G X 1/2" 1 ML MISC
5 refills | Status: DC
Start: 2020-02-29 — End: 2021-07-15

## 2020-02-29 MED ORDER — CYANOCOBALAMIN 1000 MCG/ML IJ SOLN
INTRAMUSCULAR | 5 refills | Status: DC
Start: 2020-02-29 — End: 2021-07-15

## 2020-02-29 MED ORDER — ERGOCALCIFEROL 1.25 MG (50000 UT) PO CAPS
50000.0000 [IU] | ORAL_CAPSULE | ORAL | 5 refills | Status: DC
Start: 2020-02-29 — End: 2020-08-09

## 2020-02-29 NOTE — Progress Notes (Signed)
Called and spoke to patient about her labs, abnormal lipid panel--needs to monitor her intake of fried and fatty foods. Vitamin D--will supplement with once weekly for 6 months. Vitamin B12--will send for injections weekly x3, monthly x4.

## 2020-03-03 ENCOUNTER — Encounter: Payer: Self-pay | Admitting: Hospice and Palliative Medicine

## 2020-03-14 ENCOUNTER — Ambulatory Visit: Payer: BC Managed Care – PPO | Admitting: Podiatry

## 2020-03-14 ENCOUNTER — Other Ambulatory Visit: Payer: Self-pay

## 2020-03-14 DIAGNOSIS — G5781 Other specified mononeuropathies of right lower limb: Secondary | ICD-10-CM

## 2020-03-14 DIAGNOSIS — G5761 Lesion of plantar nerve, right lower limb: Secondary | ICD-10-CM | POA: Diagnosis not present

## 2020-03-14 MED ORDER — TRIAMCINOLONE ACETONIDE 40 MG/ML IJ SUSP
20.0000 mg | Freq: Once | INTRAMUSCULAR | Status: AC
  Administered 2020-03-14: 20 mg

## 2020-03-14 NOTE — Progress Notes (Signed)
She presents today complaining of pain to the fifth metatarsals base bilaterally she also complaining of pain to the third interdigital space l right foot.  Objective: Vital signs are stable alert and oriented x3.  Pulses are palpable.  Palpable Mulder's click third interspace of the right foot.  She still has tenderness on palpation of the fifth metatarsal bases bilaterally.  Tendons appear to be intact no bursa noted.  Assessment: Resolution of bursitis but still has some tenderness on the fifth met bases bilateral associated most likely with poor shoe gear selection from the shoe market.  Plan: I injected the third interspace today Kenalog and local anesthetic encouraged her to discontinue the use of the orthotics if she is to keep their shoes and see if that will alleviate her symptoms.  If it does not alleviate her symptoms she is to notify us immediately if it does once or she is starting to become a little more tender and she can put her orthotics back in.

## 2020-04-02 ENCOUNTER — Other Ambulatory Visit: Payer: Self-pay | Admitting: Physician Assistant

## 2020-04-02 ENCOUNTER — Ambulatory Visit
Admission: RE | Admit: 2020-04-02 | Discharge: 2020-04-02 | Disposition: A | Discharge status: Home / Self Care | Payer: BC Managed Care – PPO | Attending: Physician Assistant | Admitting: Physician Assistant

## 2020-04-02 ENCOUNTER — Ambulatory Visit
Admission: RE | Admit: 2020-04-02 | Discharge: 2020-04-02 | Disposition: A | Discharge status: Home / Self Care | Payer: BC Managed Care – PPO | Source: Ambulatory Visit | Attending: Physician Assistant | Admitting: Physician Assistant

## 2020-04-02 ENCOUNTER — Ambulatory Visit: Payer: BC Managed Care – PPO | Admitting: Physician Assistant

## 2020-04-02 ENCOUNTER — Other Ambulatory Visit: Payer: Self-pay

## 2020-04-02 VITALS — BP 135/83 | HR 85 | Ht 63.0 in | Wt 153.0 lb

## 2020-04-02 DIAGNOSIS — N3001 Acute cystitis with hematuria: Secondary | ICD-10-CM

## 2020-04-02 DIAGNOSIS — R109 Unspecified abdominal pain: Secondary | ICD-10-CM

## 2020-04-02 DIAGNOSIS — Z87442 Personal history of urinary calculi: Secondary | ICD-10-CM

## 2020-04-02 MED ORDER — CIPROFLOXACIN HCL 250 MG PO TABS: 250.0000 mg | ORAL_TABLET | Freq: Two times a day (BID) | ORAL | 0 refills | Status: AC

## 2020-04-02 MED ORDER — CIPROFLOXACIN HCL 250 MG PO TABS: 250.0000 mg | ORAL_TABLET | Freq: Two times a day (BID) | ORAL | 0 refills | Status: DC

## 2020-04-02 NOTE — Progress Notes (Signed)
04/02/2020 10:22 AM   Kayla Jimenez 09/05/1975 [id number]  CC: Chief Complaint  Patient presents with  . Nephrolithiasis   HPI: Kayla Jimenez is a 44 y.o. female with PMH recurrent UTI and nephrolithiasis who presents today for evaluation of possible acute stone episode.  Today she reports 3-day history of dysuria, urgency, frequency, low back pain, and gross hematuria with passage of "strings" of blood.  She denies fever, chills, nausea, or vomiting.  She describes her pain is located in her lower right flank.  It is constant and dull in character.  She has been taking cranberry supplements, drinking cranberry grape juice, and taking ibuprofen for management of her symptoms.  KUB today reveals stable right-sided phleboliths without evidence of ureteral stone.  On CT stone study dated 07/09/2018, she was found to have a 3 mm right lower pole stone.  This is never been visualized on KUB.  In-office UA today positive for 3+ blood, nitrates, and 2+ leukocyte esterase; urine microscopy with >30 WBCs/HPF, 3-10 RBCs/HPF, >10 epithelial cells, and many bacteria.   PMH: Past Medical History:  Diagnosis Date  . Anxiety 08/29/10  . Bursitis    left foot  . BV (bacterial vaginosis) 08/29/10  . Complication of anesthesia 2004   vomited  . Gross hematuria 07/08/2015  . H/O bladder infections   . H/O mumps   . H/O toxoplasmosis   . H/O varicella   . History of kidney stones   . Infections of kidney    several kidney stones, bladder infections  . Kidney disease 09/03/2011   Left ureteral calculus with partial obstruction, passed 06/09/2014. CT did show perinephric stranding.   . Menometrorrhagia 06/26/10  . Plantar fasciitis   . PMS (premenstrual syndrome) 07/10/11  . Post - coital bleeding 08/29/10  . Renal calculus, left   . Sinus infection   . UTI (lower urinary tract infection) 07/08/2015  . Yeast infection     Surgical History: Past Surgical History:  Procedure Laterality Date  .  ABDOMINAL HYSTERECTOMY    . ANTERIOR AND POSTERIOR REPAIR  03/16/2012   Procedure: ANTERIOR (CYSTOCELE) AND POSTERIOR REPAIR (RECTOCELE);  Surgeon: [name], MD;  Location: [facility];  Service: Gynecology;  Laterality: N/A;  anterior repair/cysto  . BLADDER SUSPENSION  03/16/2012   Procedure: TRANSVAGINAL TAPE (TVT) PROCEDURE;  Surgeon: [name], MD;  Location: [facility];  Service: Gynecology;  Laterality: N/A;  . BREAST REDUCTION SURGERY  6/04  . CHOLECYSTECTOMY  06/28/14  . CYSTOSCOPY  03/16/2012   Procedure: CYSTOSCOPY;  Surgeon: [name], MD;  Location: [facility];  Service: Gynecology;  Laterality: N/A;  . CYSTOSCOPY  03/16/2017   Procedure: CYSTOSCOPY;  Surgeon: Everett Graff, MD;  Location: [facility];  Service: Gynecology;;  . Murrell Redden & CURRETTAGE/HYSTROSCOPY WITH NOVASURE ABLATION  03/16/2012   Procedure: DILATATION & CURETTAGE/HYSTEROSCOPY WITH NOVASURE ABLATION;  Surgeon: [name], MD;  Location: [facility];  Service: Gynecology;  Laterality: N/A;  . TONSILLECTOMY AND ADENOIDECTOMY    . VAGINAL HYSTERECTOMY Bilateral 03/16/2017   Procedure: HYSTERECTOMY VAGINAL Bilateral Salpingectomy ;  Surgeon: Everett Graff, MD;  Location: [facility];  Service: Gynecology;  Laterality: Bilateral;    Home Medications:  Allergies as of 04/02/2020      Reactions   Percocet [oxycodone-acetaminophen] Itching   Sulfa Antibiotics Itching   Sulfonamide Derivatives    REACTION: Eyes Itch      Medication List       Accurate as of April 02, 2020  10:22 AM. If you have any questions, ask your nurse or doctor.        buPROPion 100 MG 12 hr tablet Commonly known as: Wellbutrin SR Take 1 tablet (100 mg total) by mouth daily.   cyanocobalamin 1000 MCG/ML injection Commonly known as: (VITAMIN B-12) once aweek for 3 weeks than once a month   ergocalciferol 1.25 MG (50000 UT) capsule Commonly known as: VITAMIN D2 Take 1 capsule (50,000 Units total) by mouth once a week.    eszopiclone 2 MG Tabs tablet Commonly known as: LUNESTA Take 1 tablet (2 mg total) by mouth at bedtime.   INSULIN SYRINGE 1CC/29G 29G X 1/2" 1 ML Misc Commonly known as: Safety Insulin Syringes Use for b12 injection   meloxicam 7.5 MG tablet Commonly known as: MOBIC Take 7.5 mg by mouth 2 (two) times daily.   montelukast 10 MG tablet Commonly known as: SINGULAIR TAKE 1 TABLET BY MOUTH EVERYDAY AT BEDTIME   sertraline 50 MG tablet Commonly known as: ZOLOFT Take 1.5 tablets (75 mg total) by mouth at bedtime.       Allergies:  Allergies  Allergen Reactions  . Percocet [Oxycodone-Acetaminophen] Itching  . Sulfa Antibiotics Itching  . Sulfonamide Derivatives     REACTION: Eyes Itch    Family History: Family History  Problem Relation Age of Onset  . Cancer Other        breast  . Cancer Mother        cervical  . Depression Mother   . Drug abuse Mother   . Anemia Mother        blood transfusion  . Migraines Mother     Social History:   reports that she has never smoked. She has never used smokeless tobacco. She reports that she does not drink alcohol and does not use drugs.  Physical Exam: BP 135/83 (BP Location: Left Arm, Patient Position: Sitting, Cuff Size: Large)   Pulse 85   Ht 5\' 3"  (1.6 m)   Wt 153 lb (69.4 kg)   BMI 27.10 kg/m   Constitutional:  Alert and oriented, no acute distress, nontoxic appearing HEENT: [place], AT Cardiovascular: No clubbing, cyanosis, or edema Respiratory: Normal respiratory effort, no increased work of breathing GU: No CVA tenderness Skin: No rashes, bruises or suspicious lesions Neurologic: Grossly intact, no focal deficits, moving all 4 extremities Psychiatric: Normal mood and affect  Laboratory Data: Results for orders placed or performed in visit on 04/02/20  Microscopic Examination   Urine  Result Value Ref Range   WBC, UA >30 (A) 0 - 5 /hpf   RBC 3-10 (A) 0 - 2 /hpf   Epithelial Cells (non renal) >10 (A) 0 - 10 /hpf    Bacteria, UA Many (A) None seen/Few  Urinalysis, Complete  Result Value Ref Range   Specific Gravity, UA 1.020 1.005 - 1.030   pH, UA 5.5 5.0 - 7.5   Color, UA Straw Yellow   Appearance Ur Cloudy (A) Clear   Leukocytes,UA 2+ (A) Negative   Protein,UA Negative Negative/Trace   Glucose, UA Negative Negative   Ketones, UA Negative Negative   RBC, UA 3+ (A) Negative   Bilirubin, UA Negative Negative   Urobilinogen, Ur 0.2 0.2 - 1.0 mg/dL   Nitrite, UA Positive (A) Negative   Microscopic Examination See below:    Pertinent Imaging: KUB, 04/02/2020: CLINICAL DATA:  Right flank pain  EXAM: ABDOMEN - 1 VIEW  COMPARISON:  December 06, 2018  FINDINGS: Pelvic calcifications on the right  are stable compared to previous study and felt to represent phleboliths. No new calcifications evident. Note that stool obscures much of the right kidney. There are surgical clips in the right upper quadrant.  There is moderate stool in the colon. There is no bowel dilatation or air-fluid level to suggest bowel obstruction. No free air on supine examination.  IMPRESSION: Probable phleboliths in the pelvis. No other abnormal calcifications evident. Moderate stool throughout colon, particularly on the right. No bowel obstruction or free air. Surgical clips gallbladder fossa region.   Electronically Signed   By: Bretta Bang III M.D.   On: 04/02/2020 10:04  I personally reviewed the images referenced above and note stable phleboliths with no evidence of urolithiasis..  Assessment & Plan:   1. Acute cystitis with hematuria UA grossly infected today despite contaminated sample.  Will start empiric Cipro and send for culture for further evaluation with plans for repeat UA in 1 week to prove resolution of MH.  VSS today, symptoms seem to be originating in the bladder.  Low suspicion for acute stone episode.  Recommend CT stone study with persistent symptoms, persistent MH, or  development of fever, chills, nausea, vomiting, or worsened right flank pain.  Reviewed return instructions with patient today, she expressed understanding. - Urinalysis, Complete - CULTURE, URINE COMPREHENSIVE - ciprofloxacin (CIPRO) 250 MG tablet; Take 1 tablet (250 mg total) by mouth 2 (two) times daily for 5 days.  Dispense: 10 tablet; Refill: 0   Return in about 1 week (around 04/09/2020) for Lab visit for UA.  Carman Ching, PA-C  [facility] [address], [address] [place], [place] [address] [phone]

## 2020-04-03 LAB — URINALYSIS, COMPLETE
Bilirubin, UA: NEGATIVE
Glucose, UA: NEGATIVE
Ketones, UA: NEGATIVE
Nitrite, UA: POSITIVE — AB
Protein,UA: NEGATIVE
Specific Gravity, UA: 1.02 (ref 1.005–1.030)
Urobilinogen, Ur: 0.2 mg/dL (ref 0.2–1.0)
pH, UA: 5.5 (ref 5.0–7.5)

## 2020-04-03 LAB — MICROSCOPIC EXAMINATION
Epithelial Cells (non renal): >10 /hpf — AB (ref 0–10)
WBC, UA: >30 /hpf — AB (ref 0–5)

## 2020-04-04 ENCOUNTER — Ambulatory Visit: Payer: BC Managed Care – PPO | Admitting: Physician Assistant

## 2020-04-06 DIAGNOSIS — R109 Unspecified abdominal pain: Secondary | ICD-10-CM

## 2020-04-07 LAB — CULTURE, URINE COMPREHENSIVE

## 2020-04-09 ENCOUNTER — Encounter: Payer: BC Managed Care – PPO | Admitting: Hospice and Palliative Medicine

## 2020-04-09 ENCOUNTER — Other Ambulatory Visit: Payer: Self-pay

## 2020-04-09 ENCOUNTER — Other Ambulatory Visit: Payer: BC Managed Care – PPO

## 2020-04-09 DIAGNOSIS — N3001 Acute cystitis with hematuria: Secondary | ICD-10-CM

## 2020-04-09 NOTE — Telephone Encounter (Signed)
Please contact the patient and inform her that based on her reports of persistent right flank pain despite culture appropriate antibiotics, I would like to proceed with CT stone study at this time to ensure she is not passing a stone.  I have placed an order for CT scan today and the imaging department will contact her to schedule this.  If she develops uncontrollable pain, fever, chills, nausea, or vomiting, I would like her to proceed to the emergency room immediately.

## 2020-04-09 NOTE — Telephone Encounter (Signed)
Per DPR, left message on voicemail notifying patient as advised. Advised patient to call back should she have any questions and to go to the ED should she develop any of the infective symptoms below in the interim.

## 2020-04-10 LAB — URINALYSIS, COMPLETE
Bilirubin, UA: NEGATIVE
Glucose, UA: NEGATIVE
Ketones, UA: NEGATIVE
Leukocytes,UA: NEGATIVE
Nitrite, UA: NEGATIVE
Protein,UA: NEGATIVE
RBC, UA: NEGATIVE
Specific Gravity, UA: >1.03 — ABNORMAL HIGH (ref 1.005–1.030)
Urobilinogen, Ur: 0.2 mg/dL (ref 0.2–1.0)
pH, UA: 5.5 (ref 5.0–7.5)

## 2020-04-10 LAB — MICROSCOPIC EXAMINATION: RBC, Urine: NONE SEEN /hpf (ref 0–2)

## 2020-04-11 ENCOUNTER — Other Ambulatory Visit: Payer: Self-pay

## 2020-04-11 ENCOUNTER — Encounter: Payer: Self-pay | Admitting: Hospice and Palliative Medicine

## 2020-04-11 ENCOUNTER — Ambulatory Visit (INDEPENDENT_AMBULATORY_CARE_PROVIDER_SITE_OTHER): Payer: BC Managed Care – PPO | Admitting: Hospice and Palliative Medicine

## 2020-04-11 VITALS — BP 122/86 | HR 85 | Temp 97.9°F | Resp 16 | Ht 63.0 in | Wt 156.8 lb

## 2020-04-11 DIAGNOSIS — E538 Deficiency of other specified B group vitamins: Secondary | ICD-10-CM | POA: Diagnosis not present

## 2020-04-11 DIAGNOSIS — F321 Major depressive disorder, single episode, moderate: Secondary | ICD-10-CM

## 2020-04-11 DIAGNOSIS — E782 Mixed hyperlipidemia: Secondary | ICD-10-CM | POA: Diagnosis not present

## 2020-04-11 DIAGNOSIS — R300 Dysuria: Secondary | ICD-10-CM

## 2020-04-11 DIAGNOSIS — E559 Vitamin D deficiency, unspecified: Secondary | ICD-10-CM

## 2020-04-11 NOTE — Progress Notes (Signed)
[facility] [place], [place] [address]  Internal MEDICINE  Office Visit Note  Patient Name: Kayla Jimenez  [id number]  [id number]  Date of Service: 04/12/2020  Chief Complaint  Patient presents with  . Annual Exam  . Follow-up    6 week f-up  . Quality Metric Gaps    Hep C screen, HIV screen, Tdap, Covid booster     HPI Pt is here for routine follow-up CPE done by GYN--vaginal exams and mammograms Past ONO--no evidence of nocturnal hypoxia Last visit started on Wellbutrin for depression, symptoms are somewhat improved, she feels this is being triggered primarily by her husband working extra overtime and not having a lot of time to spend at home and help with housework Also feels social distancing due to COVID precautions is contributing  Reviewed labs--was started on B12 and vitamin D supplement, has not noticed significant difference in symptoms Again discussed elevated lipid levels   Current Medication: Outpatient Encounter Medications as of 04/11/2020  Medication Sig  . buPROPion (WELLBUTRIN SR) 100 MG 12 hr tablet Take 1 tablet (100 mg total) by mouth daily.  . Cranberry-Vitamin C (CRANBERRY CONCENTRATE/VITAMINC) 15000-100 MG CAPS Take by mouth.  . cyanocobalamin (,VITAMIN B-12,) 1000 MCG/ML injection once aweek for 3 weeks than once a month  . ergocalciferol (VITAMIN D2) 1.25 MG (50000 UT) capsule Take 1 capsule (50,000 Units total) by mouth once a week.  . eszopiclone (LUNESTA) 2 MG TABS tablet Take 1 tablet (2 mg total) by mouth at bedtime.  . INSULIN SYRINGE 1CC/29G (SAFETY INSULIN SYRINGES) 29G X 1/2" 1 ML MISC Use for b12 injection  . meloxicam (MOBIC) 7.5 MG tablet Take 7.5 mg by mouth 2 (two) times daily.  . montelukast (SINGULAIR) 10 MG tablet TAKE 1 TABLET BY MOUTH EVERYDAY AT BEDTIME  . sertraline (ZOLOFT) 50 MG tablet Take 1.5 tablets (75 mg total) by mouth at bedtime.   No facility-administered encounter medications on file as of  04/11/2020.    Surgical History: Past Surgical History:  Procedure Laterality Date  . ABDOMINAL HYSTERECTOMY    . ANTERIOR AND POSTERIOR REPAIR  03/16/2012   Procedure: ANTERIOR (CYSTOCELE) AND POSTERIOR REPAIR (RECTOCELE);  Surgeon: [name], MD;  Location: [facility];  Service: Gynecology;  Laterality: N/A;  anterior repair/cysto  . BLADDER SUSPENSION  03/16/2012   Procedure: TRANSVAGINAL TAPE (TVT) PROCEDURE;  Surgeon: [name], MD;  Location: [facility];  Service: Gynecology;  Laterality: N/A;  . BREAST REDUCTION SURGERY  6/04  . CHOLECYSTECTOMY  06/28/14  . CYSTOSCOPY  03/16/2012   Procedure: CYSTOSCOPY;  Surgeon: [name], MD;  Location: [facility];  Service: Gynecology;  Laterality: N/A;  . CYSTOSCOPY  03/16/2017   Procedure: CYSTOSCOPY;  Surgeon: Everett Graff, MD;  Location: [facility];  Service: Gynecology;;  . Murrell Redden & CURRETTAGE/HYSTROSCOPY WITH NOVASURE ABLATION  03/16/2012   Procedure: DILATATION & CURETTAGE/HYSTEROSCOPY WITH NOVASURE ABLATION;  Surgeon: [name], MD;  Location: [facility];  Service: Gynecology;  Laterality: N/A;  . TONSILLECTOMY AND ADENOIDECTOMY    . VAGINAL HYSTERECTOMY Bilateral 03/16/2017   Procedure: HYSTERECTOMY VAGINAL Bilateral Salpingectomy ;  Surgeon: Everett Graff, MD;  Location: [facility];  Service: Gynecology;  Laterality: Bilateral;    Medical History: Past Medical History:  Diagnosis Date  . Anxiety 08/29/10  . Bursitis    left foot  . BV (bacterial vaginosis) 08/29/10  . Complication of anesthesia 2004   vomited  . Gross hematuria 07/08/2015  . H/O bladder  infections   . H/O mumps   . H/O toxoplasmosis   . H/O varicella   . History of kidney stones   . Infections of kidney    several kidney stones, bladder infections  . Kidney disease 09/03/2011   Left ureteral calculus with partial obstruction, passed 06/09/2014. CT did show perinephric stranding.   . Menometrorrhagia 06/26/10  . Plantar fasciitis   . PMS  (premenstrual syndrome) 07/10/11  . Post - coital bleeding 08/29/10  . Renal calculus, left   . Sinus infection   . UTI (lower urinary tract infection) 07/08/2015  . Yeast infection     Family History: Family History  Problem Relation Age of Onset  . Cancer Other        breast  . Cancer Mother        cervical  . Depression Mother   . Drug abuse Mother   . Anemia Mother        blood transfusion  . Migraines Mother       Review of Systems  Constitutional: Negative for chills, diaphoresis and fatigue.  HENT: Negative for ear pain, postnasal drip and sinus pressure.   Eyes: Negative for photophobia, discharge, redness, itching and visual disturbance.  Respiratory: Negative for cough, shortness of breath and wheezing.   Cardiovascular: Negative for chest pain, palpitations and leg swelling.  Gastrointestinal: Negative for abdominal pain, constipation, diarrhea, nausea and vomiting.  Genitourinary: Negative for dysuria and flank pain.  Musculoskeletal: Negative for arthralgias, back pain, gait problem and neck pain.  Skin: Negative for color change.  Allergic/Immunologic: Negative for environmental allergies and food allergies.  Neurological: Negative for dizziness and headaches.  Hematological: Does not bruise/bleed easily.  Psychiatric/Behavioral: Negative for agitation, behavioral problems (depression) and hallucinations.     Vital Signs: BP 122/86   Pulse 85   Temp 97.9 F (36.6 C)   Resp 16   Ht 5' 3"  (1.6 m)   Wt 156 lb 12.8 oz (71.1 kg)   SpO2 97%   BMI 27.78 kg/m    Physical Exam Vitals reviewed.  Constitutional:      Appearance: Normal appearance. She is normal weight.  Cardiovascular:     Rate and Rhythm: Normal rate and regular rhythm.     Pulses: Normal pulses.     Heart sounds: Normal heart sounds.  Pulmonary:     Effort: Pulmonary effort is normal.     Breath sounds: Normal breath sounds.  Musculoskeletal:        General: Normal range of motion.      Cervical back: Normal range of motion.  Skin:    General: Skin is warm.  Neurological:     General: No focal deficit present.     Mental Status: She is alert and oriented to person, place, and time. Mental status is at baseline.  Psychiatric:        Mood and Affect: Mood normal.        Behavior: Behavior normal.        Thought Content: Thought content normal.        Judgment: Judgment normal.      LABS: Recent Results (from the past 2160 hour(s))  CBC w/Diff/Platelet     Status: Abnormal   Collection Time: 02/28/20  8:11 AM  Result Value Ref Range   WBC 10.2 3.4 - 10.8 x10E3/uL   RBC 4.63 3.77 - 5.28 x10E6/uL   Hemoglobin 14.7 11.1 - 15.9 g/dL   Hematocrit 43.3 34.0 - 46.6 %  MCV 94 79 - 97 fL   MCH 31.7 26.6 - 33.0 pg   MCHC 33.9 31.5 - 35.7 g/dL   RDW 11.0 (L) 11.7 - 15.4 %   Platelets 316 150 - 450 x10E3/uL   Neutrophils 69 Not Estab. %   Lymphs 22 Not Estab. %   Monocytes 7 Not Estab. %   Eos 1 Not Estab. %   Basos 1 Not Estab. %   Neutrophils Absolute 7.0 1.4 - 7.0 x10E3/uL   Lymphocytes Absolute 2.3 0.7 - 3.1 x10E3/uL   Monocytes Absolute 0.7 0.1 - 0.9 x10E3/uL   EOS (ABSOLUTE) 0.1 0.0 - 0.4 x10E3/uL   Basophils Absolute 0.1 0.0 - 0.2 x10E3/uL   Immature Granulocytes 0 Not Estab. %   Immature Grans (Abs) 0.0 0.0 - 0.1 x10E3/uL  Comprehensive Metabolic Panel (CMET)     Status: Abnormal   Collection Time: 02/28/20  8:11 AM  Result Value Ref Range   Glucose 101 (H) 65 - 99 mg/dL   BUN 13 6 - 24 mg/dL   Creatinine, Ser 0.76 0.57 - 1.00 mg/dL   GFR calc non Af Amer 96 >59 mL/min/1.73   GFR calc Af Amer 110 >59 mL/min/1.73    Comment: **In accordance with recommendations from the NKF-ASN Task force,**   [facility] is in the process of updating its eGFR calculation to the   2021 CKD-EPI creatinine equation that estimates kidney function   without a race variable.    BUN/Creatinine Ratio 17 9 - 23   Sodium 139 134 - 144 mmol/L   Potassium 4.6 3.5 - 5.2 mmol/L    Chloride 104 96 - 106 mmol/L   CO2 21 20 - 29 mmol/L   Calcium 9.3 8.7 - 10.2 mg/dL   Total Protein 6.9 6.0 - 8.5 g/dL   Albumin 4.5 3.8 - 4.8 g/dL   Globulin, Total 2.4 1.5 - 4.5 g/dL   Albumin/Globulin Ratio 1.9 1.2 - 2.2   Bilirubin Total 0.5 0.0 - 1.2 mg/dL   Alkaline Phosphatase 81 44 - 121 IU/L    Comment:               **Please note reference interval change**   AST 10 0 - 40 IU/L   ALT 8 0 - 32 IU/L  TSH + free T4     Status: None   Collection Time: 02/28/20  8:11 AM  Result Value Ref Range   TSH 1.470 0.450 - 4.500 uIU/mL   Free T4 0.96 0.82 - 1.77 ng/dL  Lipid Panel With LDL/HDL Ratio     Status: Abnormal   Collection Time: 02/28/20  8:11 AM  Result Value Ref Range   Cholesterol, Total 230 (H) 100 - 199 mg/dL   Triglycerides 142 0 - 149 mg/dL   HDL 39 (L) >39 mg/dL   VLDL Cholesterol Cal 26 5 - 40 mg/dL   LDL Chol Calc (NIH) 165 (H) 0 - 99 mg/dL   LDL/HDL Ratio 4.2 (H) 0.0 - 3.2 ratio    Comment:                                     LDL/HDL Ratio                                             Men  Women                               1/2 Avg.Risk  1.0    1.5                                   Avg.Risk  3.6    3.2                                2X Avg.Risk  6.2    5.0                                3X Avg.Risk  8.0    6.1   Vitamin D (25 hydroxy)     Status: Abnormal   Collection Time: 02/28/20  8:11 AM  Result Value Ref Range   Vit D, 25-Hydroxy 19.4 (L) 30.0 - 100.0 ng/mL    Comment: Vitamin D deficiency has been defined by the [place] practice guideline as a level of serum 25-OH vitamin D less than 20 ng/mL (1,2). The Endocrine Society went on to further define vitamin D insufficiency as a level between 21 and 29 ng/mL (2). 1. IOM (Institute of Medicine). 2010. Dietary reference    intakes for calcium and D. [place]: The    [employer]. 2. Holick MF, Binkley [place], Bischoff-Ferrari HA, et al.    Evaluation,  treatment, and prevention of vitamin D    deficiency: an Endocrine Society clinical practice    guideline. JCEM. 2011 Jul; 96(7):1911-30.   B12     Status: Abnormal   Collection Time: 02/28/20  8:11 AM  Result Value Ref Range   Vitamin B-12 220 (L) 232 - 1,245 pg/mL  Urinalysis, Complete     Status: Abnormal   Collection Time: 04/02/20 11:05 AM  Result Value Ref Range   Specific Gravity, UA 1.020 1.005 - 1.030   pH, UA 5.5 5.0 - 7.5   Color, UA Straw Yellow   Appearance Ur Cloudy (A) Clear   Leukocytes,UA 2+ (A) Negative   Protein,UA Negative Negative/Trace   Glucose, UA Negative Negative   Ketones, UA Negative Negative   RBC, UA 3+ (A) Negative   Bilirubin, UA Negative Negative   Urobilinogen, Ur 0.2 0.2 - 1.0 mg/dL   Nitrite, UA Positive (A) Negative   Microscopic Examination See below:   Microscopic Examination     Status: Abnormal   Collection Time: 04/02/20 11:05 AM   Urine  Result Value Ref Range   WBC, UA >30 (A) 0 - 5 /hpf   RBC 3-10 (A) 0 - 2 /hpf   Epithelial Cells (non renal) >10 (A) 0 - 10 /hpf   Bacteria, UA Many (A) None seen/Few  CULTURE, URINE COMPREHENSIVE     Status: Abnormal   Collection Time: 04/02/20  2:09 PM   Specimen: Urine   UR  Result Value Ref Range   Urine Culture, Comprehensive Final report (A)    Organism ID, Bacteria Escherichia coli (A)     Comment: Cefazolin <=4 ug/mL Cefazolin with an MIC <=16 predicts susceptibility to the oral agents cefaclor, cefdinir, cefpodoxime, cefprozil, cefuroxime, cephalexin, and loracarbef when used for therapy of uncomplicated urinary tract infections due to  E. coli, Klebsiella pneumoniae, and Proteus mirabilis. Greater than 100,000 colony forming units per mL    ANTIMICROBIAL SUSCEPTIBILITY Comment     Comment:       ** S = Susceptible; I = Intermediate; R = Resistant **                    P = Positive; N = Negative             MICS are expressed in micrograms per mL    Antibiotic                 RSLT#1     RSLT#2    RSLT#3    RSLT#4 Amoxicillin/Clavulanic Acid    S Ampicillin                     S Cefepime                       S Ceftriaxone                    S Cefuroxime                     S Ciprofloxacin                  S Ertapenem                      S Gentamicin                     S Imipenem                       S Levofloxacin                   S Meropenem                      S Nitrofurantoin                 S Piperacillin/Tazobactam        S Tetracycline                   S Tobramycin                     S Trimethoprim/Sulfa             S   Urinalysis, Complete     Status: Abnormal   Collection Time: 04/09/20  8:33 AM  Result Value Ref Range   Specific Gravity, UA >1.030 (H) 1.005 - 1.030   pH, UA 5.5 5.0 - 7.5   Color, UA Yellow Yellow   Appearance Ur Clear Clear   Leukocytes,UA Negative Negative   Protein,UA Negative Negative/Trace   Glucose, UA Negative Negative   Ketones, UA Negative Negative   RBC, UA Negative Negative   Bilirubin, UA Negative Negative   Urobilinogen, Ur 0.2 0.2 - 1.0 mg/dL   Nitrite, UA Negative Negative   Microscopic Examination See below:   Microscopic Examination     Status: Abnormal   Collection Time: 04/09/20  8:33 AM   Urine  Result Value Ref Range   WBC, UA 0-5 0 - 5 /hpf   RBC None seen 0 - 2 /hpf   Epithelial Cells (non renal) 0-10 0 - 10 /hpf   Crystals Present (A) N/A  Crystal Type Calcium Oxalate N/A   Mucus, UA Present (A) Not Estab.   Bacteria, UA Few (A) None seen/Few  UA/M w/rflx Culture, Routine     Status: None   Collection Time: 04/11/20  4:59 PM   Specimen: Urine   Urine  Result Value Ref Range   Specific Gravity, UA 1.016 1.005 - 1.030   pH, UA 6.5 5.0 - 7.5   Color, UA Yellow Yellow   Appearance Ur Clear Clear   Leukocytes,UA Negative Negative   Protein,UA Negative Negative/Trace   Glucose, UA Negative Negative   Ketones, UA Negative Negative   RBC, UA Negative Negative   Bilirubin, UA Negative  Negative   Urobilinogen, Ur 0.2 0.2 - 1.0 mg/dL   Nitrite, UA Negative Negative   Microscopic Examination Comment     Comment: Microscopic follows if indicated.   Microscopic Examination See below:     Comment: Microscopic was indicated and was performed.   Urinalysis Reflex Comment     Comment: This specimen will not reflex to a Urine Culture.  Microscopic Examination     Status: None   Collection Time: 04/11/20  4:59 PM   Urine  Result Value Ref Range   WBC, UA 0-5 0 - 5 /hpf   RBC None seen 0 - 2 /hpf   Epithelial Cells (non renal) 0-10 0 - 10 /hpf   Casts None seen None seen /lpf   Bacteria, UA None seen None seen/Few    Assessment/Plan: 1. 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 Vitamin B12 deficiency Continue supplementation  4. Vitamin D deficiency Continue supplementation  5. Dysuria - UA/M w/rflx Culture, Routine - Microscopic Examination  General Counseling: Natalee verbalizes understanding of the findings of todays visit and agrees with plan of treatment. I have discussed any further diagnostic evaluation that may be needed or ordered today. We also reviewed her medications today. she has been encouraged to call the office with any questions or concerns that should arise related to todays visit.    Counseling:    Orders Placed This Encounter  Procedures  . Microscopic Examination  . UA/M w/rflx Culture, Routine      Total time spent: 30 Minutes  Time spent includes review of chart, medications, test results, and follow up plan with the patient.   This patient was seen by Casey Burkitt AGNP-C Collaboration with Dr Lavera Guise as a part of collaborative care agreement   Tanna Furry. [facility]

## 2020-04-12 ENCOUNTER — Encounter: Payer: Self-pay | Admitting: Hospice and Palliative Medicine

## 2020-04-12 LAB — UA/M W/RFLX CULTURE, ROUTINE
Bilirubin, UA: NEGATIVE
Glucose, UA: NEGATIVE
Ketones, UA: NEGATIVE
Leukocytes,UA: NEGATIVE
Nitrite, UA: NEGATIVE
Protein,UA: NEGATIVE
RBC, UA: NEGATIVE
Specific Gravity, UA: 1.016 (ref 1.005–1.030)
Urobilinogen, Ur: 0.2 mg/dL (ref 0.2–1.0)
pH, UA: 6.5 (ref 5.0–7.5)

## 2020-04-12 LAB — MICROSCOPIC EXAMINATION
Bacteria, UA: NONE SEEN
Casts: NONE SEEN /lpf
RBC, Urine: NONE SEEN /hpf (ref 0–2)

## 2020-04-18 ENCOUNTER — Encounter: Payer: Self-pay | Admitting: Podiatry

## 2020-04-18 ENCOUNTER — Ambulatory Visit: Payer: Self-pay | Admitting: Podiatry

## 2020-04-18 ENCOUNTER — Other Ambulatory Visit: Payer: Self-pay

## 2020-04-18 DIAGNOSIS — G5761 Lesion of plantar nerve, right lower limb: Secondary | ICD-10-CM

## 2020-04-18 DIAGNOSIS — G5781 Other specified mononeuropathies of right lower limb: Secondary | ICD-10-CM

## 2020-04-18 NOTE — Progress Notes (Signed)
She presents today for follow-up of her bursitis to the fifth metatarsals bilaterally states that she had new shoes for Christmas states that that part is doing well she is still experiencing pain to the third interdigital space of the right foot.  Objective: Vital signs are stable alert and oriented x3 there is no erythema edema cellulitis drainage or odor.  Pulses are palpable.  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  Assessment: Well-healing neuroma and bursitis.  Plan: Reinjected neuroma today with 5 mg of Kenalog and local anesthetic discussed the need for dehydrated alcohol starting next visit she said she would rather do that than surgery.  So I will follow-up with her in 3 weeks for reevaluation of the neuroma and less it is completely resolved.  Remember to ask her son if he took the position at [employer]

## 2020-04-19 ENCOUNTER — Other Ambulatory Visit: Payer: Self-pay | Admitting: Hospice and Palliative Medicine

## 2020-05-09 ENCOUNTER — Ambulatory Visit: Payer: Self-pay | Admitting: Podiatry

## 2020-05-30 ENCOUNTER — Ambulatory Visit: Payer: Self-pay | Admitting: Podiatry

## 2020-05-31 ENCOUNTER — Other Ambulatory Visit: Payer: Self-pay | Admitting: Hospice and Palliative Medicine

## 2020-05-31 DIAGNOSIS — F321 Major depressive disorder, single episode, moderate: Secondary | ICD-10-CM

## 2020-06-18 ENCOUNTER — Ambulatory Visit: Payer: BC Managed Care – PPO | Admitting: Podiatry

## 2020-06-18 ENCOUNTER — Encounter: Payer: Self-pay | Admitting: Podiatry

## 2020-06-18 ENCOUNTER — Other Ambulatory Visit: Payer: Self-pay

## 2020-06-18 DIAGNOSIS — G5781 Other specified mononeuropathies of right lower limb: Secondary | ICD-10-CM

## 2020-06-18 DIAGNOSIS — G5761 Lesion of plantar nerve, right lower limb: Secondary | ICD-10-CM

## 2020-06-18 NOTE — Progress Notes (Signed)
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  Capsulitis second metatarsophalangeal joint of the right foot.  Objective: Vital signs are stable alert oriented x3 no erythema edema cellulitis drainage odor minimal pain on palpation third interspace of the right foot no pain on palpation sec metatarsophalangeal joint of the right foot.  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  Plan: Follow-up with me on an as-needed basis.

## 2020-07-10 ENCOUNTER — Ambulatory Visit: Payer: BC Managed Care – PPO | Admitting: Hospice and Palliative Medicine

## 2020-07-10 ENCOUNTER — Other Ambulatory Visit: Payer: Self-pay | Admitting: Hospice and Palliative Medicine

## 2020-07-10 DIAGNOSIS — F5101 Primary insomnia: Secondary | ICD-10-CM

## 2020-07-21 ENCOUNTER — Other Ambulatory Visit: Payer: Self-pay | Admitting: Nurse Practitioner

## 2020-07-21 DIAGNOSIS — F321 Major depressive disorder, single episode, moderate: Secondary | ICD-10-CM

## 2020-07-23 ENCOUNTER — Other Ambulatory Visit: Payer: Self-pay | Admitting: Hospice and Palliative Medicine

## 2020-07-23 ENCOUNTER — Telehealth: Payer: Self-pay

## 2020-07-23 ENCOUNTER — Other Ambulatory Visit: Payer: Self-pay

## 2020-07-23 DIAGNOSIS — F5101 Primary insomnia: Secondary | ICD-10-CM

## 2020-07-23 DIAGNOSIS — J301 Allergic rhinitis due to pollen: Secondary | ICD-10-CM

## 2020-07-23 DIAGNOSIS — F321 Major depressive disorder, single episode, moderate: Secondary | ICD-10-CM

## 2020-07-23 MED ORDER — MONTELUKAST SODIUM 10 MG PO TABS: ORAL_TABLET | ORAL | 1 refills | Status: DC

## 2020-07-23 MED ORDER — SERTRALINE HCL 50 MG PO TABS: 75.0000 mg | ORAL_TABLET | Freq: Every day | ORAL | 2 refills | Status: DC

## 2020-07-23 MED ORDER — ESZOPICLONE 2 MG PO TABS: 2.0000 mg | ORAL_TABLET | Freq: Every day | ORAL | 0 refills | Status: DC

## 2020-07-24 ENCOUNTER — Ambulatory Visit: Payer: BC Managed Care – PPO | Admitting: Hospice and Palliative Medicine

## 2020-07-24 NOTE — Telephone Encounter (Signed)
Pt.notified

## 2020-08-09 ENCOUNTER — Ambulatory Visit: Payer: BC Managed Care – PPO | Admitting: Hospice and Palliative Medicine

## 2020-08-09 ENCOUNTER — Other Ambulatory Visit: Payer: Self-pay

## 2020-08-09 ENCOUNTER — Encounter: Payer: Self-pay | Admitting: Hospice and Palliative Medicine

## 2020-08-09 ENCOUNTER — Other Ambulatory Visit: Payer: Self-pay | Admitting: Hospice and Palliative Medicine

## 2020-08-09 VITALS — BP 136/82 | HR 86 | Temp 98.4°F | Resp 16 | Ht 63.0 in | Wt 160.0 lb

## 2020-08-09 DIAGNOSIS — U099 Post covid-19 condition, unspecified: Secondary | ICD-10-CM | POA: Diagnosis not present

## 2020-08-09 DIAGNOSIS — F5101 Primary insomnia: Secondary | ICD-10-CM

## 2020-08-09 DIAGNOSIS — F321 Major depressive disorder, single episode, moderate: Secondary | ICD-10-CM

## 2020-08-09 DIAGNOSIS — R053 Chronic cough: Secondary | ICD-10-CM | POA: Diagnosis not present

## 2020-08-09 MED ORDER — ESZOPICLONE 2 MG PO TABS: 2.0000 mg | ORAL_TABLET | Freq: Every day | ORAL | 0 refills | Status: DC

## 2020-08-09 MED ORDER — AZITHROMYCIN 250 MG PO TABS: ORAL_TABLET | ORAL | 0 refills | Status: AC

## 2020-08-09 MED ORDER — BUPROPION HCL ER (SR) 100 MG PO TB12: 100.0000 mg | ORAL_TABLET | Freq: Every day | ORAL | 0 refills | Status: DC

## 2020-08-09 NOTE — Progress Notes (Signed)
[facility] [place], [place] [address]  Internal MEDICINE  Office Visit Note  Patient Name: Kayla Jimenez  [id number]  [id number]  Date of Service: 08/10/2020  Chief Complaint  Patient presents with  . Anxiety  . Follow-up    Pt was positive  of covid over 3 weeks ago, she still has a cough  . 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 azithromycin (ZITHROMAX) 250 MG tablet Take 2 tablets on day 1, then 1 tablet daily on days 2 through 5  . Cranberry-Vitamin C (CRANBERRY CONCENTRATE/VITAMINC) 15000-100 MG CAPS Take by mouth.  . cyanocobalamin (,VITAMIN B-12,) 1000 MCG/ML injection once aweek for 3 weeks than once a month  . INSULIN SYRINGE 1CC/29G (SAFETY INSULIN SYRINGES) 29G X 1/2" 1 ML MISC Use for b12 injection  . meloxicam (MOBIC) 7.5 MG tablet Take 7.5 mg by mouth 2 (two) times daily.  . montelukast (SINGULAIR) 10 MG tablet TAKE 1 TABLET BY MOUTH EVERYDAY AT BEDTIME  . sertraline (ZOLOFT) 50 MG tablet Take 1.5 tablets (75 mg total) by mouth at bedtime.  . Vitamin D, Ergocalciferol, (DRISDOL) 1.25 MG (50000 UNIT) CAPS capsule TAKE 1 CAPSULE BY MOUTH ONE TIME PER WEEK  . [DISCONTINUED] buPROPion (WELLBUTRIN SR) 100 MG 12 hr tablet TAKE 1 TABLET BY MOUTH EVERY DAY  . [DISCONTINUED] eszopiclone (LUNESTA) 2 MG TABS tablet Take 1 tablet (2 mg total) by mouth at  bedtime.  Marland Kitchen buPROPion (WELLBUTRIN SR) 100 MG 12 hr tablet Take 1 tablet (100 mg total) by mouth daily.  . eszopiclone (LUNESTA) 2 MG TABS tablet Take 1 tablet (2 mg total) by mouth at bedtime.   No facility-administered encounter medications on file as of 08/09/2020.    Surgical History: Past Surgical History:  Procedure Laterality Date  . ABDOMINAL HYSTERECTOMY    . ANTERIOR AND POSTERIOR REPAIR  03/16/2012   Procedure: ANTERIOR (CYSTOCELE) AND POSTERIOR REPAIR (RECTOCELE);  Surgeon: [name], MD;  Location: [facility];  Service: Gynecology;  Laterality: N/A;  anterior repair/cysto  . BLADDER SUSPENSION  03/16/2012   Procedure: TRANSVAGINAL TAPE (TVT) PROCEDURE;  Surgeon: [name], MD;  Location: [facility];  Service: Gynecology;  Laterality: N/A;  . BREAST REDUCTION SURGERY  6/04  . CHOLECYSTECTOMY  06/28/14  . CYSTOSCOPY  03/16/2012   Procedure: CYSTOSCOPY;  Surgeon: [name], MD;  Location: [facility];  Service: Gynecology;  Laterality: N/A;  . CYSTOSCOPY  03/16/2017   Procedure: CYSTOSCOPY;  Surgeon: Everett Graff, MD;  Location: [facility];  Service: Gynecology;;  . Murrell Redden & CURRETTAGE/HYSTROSCOPY WITH NOVASURE ABLATION  03/16/2012   Procedure: DILATATION & CURETTAGE/HYSTEROSCOPY WITH NOVASURE ABLATION;  Surgeon: [name], MD;  Location: [facility];  Service: Gynecology;  Laterality: N/A;  . TONSILLECTOMY AND ADENOIDECTOMY    . VAGINAL HYSTERECTOMY Bilateral 03/16/2017   Procedure: HYSTERECTOMY VAGINAL Bilateral Salpingectomy ;  Surgeon: Mancel Bale,  Levada Dy, MD;  Location: [facility];  Service: Gynecology;  Laterality: Bilateral;    Medical History: Past Medical History:  Diagnosis Date  . Anxiety 08/29/10  . Bursitis    left foot  . BV (bacterial vaginosis) 08/29/10  . Complication of anesthesia 2004   vomited  . Gross hematuria 07/08/2015  . H/O bladder infections   . H/O mumps   . H/O toxoplasmosis   . H/O varicella   . History of kidney stones   . Infections of  kidney    several kidney stones, bladder infections  . Kidney disease 09/03/2011   Left ureteral calculus with partial obstruction, passed 06/09/2014. CT did show perinephric stranding.   . Menometrorrhagia 06/26/10  . Plantar fasciitis   . PMS (premenstrual syndrome) 07/10/11  . Post - coital bleeding 08/29/10  . Renal calculus, left   . Sinus infection   . UTI (lower urinary tract infection) 07/08/2015  . Yeast infection     Family History: Family History  Problem Relation Age of Onset  . Cancer Other        breast  . Cancer Mother        cervical  . Depression Mother   . Drug abuse Mother   . Anemia Mother        blood transfusion  . Migraines Mother     Social History   Socioeconomic History  . Marital status: Married    Spouse name: Not on file  . Number of children: Not on file  . Years of education: Not on file  . Highest education level: Not on file  Occupational History  . Not on file  Tobacco Use  . Smoking status: Never Smoker  . Smokeless tobacco: Never Used  Vaping Use  . Vaping Use: Never used  Substance and Sexual Activity  . Alcohol use: No  . Drug use: No  . Sexual activity: Yes    Birth control/protection: Surgical    Comment: pt spouse had vasec.   Other Topics Concern  . Not on file  Social History Narrative  . Not on file   Social Determinants of Health   Financial Resource Strain: Not on file  Food Insecurity: Not on file  Transportation Needs: Not on file  Physical Activity: Not on file  Stress: Not on file  Social Connections: Not on file  Intimate Partner Violence: Not on file      Review of Systems  Constitutional: Negative for chills, diaphoresis and fatigue.  HENT: Negative for ear pain, postnasal drip and sinus pressure.   Eyes: Negative for photophobia, discharge, redness, itching and visual disturbance.  Respiratory: Positive for cough. Negative for shortness of breath and wheezing.   Cardiovascular: Negative for chest  pain, palpitations and leg swelling.  Gastrointestinal: Negative for abdominal pain, constipation, diarrhea, nausea and vomiting.  Genitourinary: Negative for dysuria and flank pain.  Musculoskeletal: Negative for arthralgias, back pain, gait problem and neck pain.  Skin: Negative for color change.  Allergic/Immunologic: Negative for environmental allergies and food allergies.  Neurological: Negative for dizziness and headaches.  Hematological: Does not bruise/bleed easily.  Psychiatric/Behavioral: Negative for agitation, behavioral problems (depression) and hallucinations.    Vital Signs: BP 136/82   Pulse 86   Temp 98.4 F (36.9 C)   Resp 16   Ht 5\' 3"  (1.6 m)   Wt 160 lb (72.6 kg)   SpO2 97%   BMI 28.34 kg/m    Physical Exam Vitals reviewed.  Constitutional:  Appearance: Normal appearance. She is normal weight.  Cardiovascular:     Rate and Rhythm: Normal rate and regular rhythm.     Pulses: Normal pulses.     Heart sounds: Normal heart sounds.  Pulmonary:     Effort: Pulmonary effort is normal.     Breath sounds: Normal breath sounds.  Abdominal:     General: Abdomen is flat.     Palpations: Abdomen is soft.  Musculoskeletal:        General: Normal range of motion.     Cervical back: Normal range of motion.  Skin:    General: Skin is warm.  Neurological:     General: No focal deficit present.     Mental Status: She is alert and oriented to person, place, and time. Mental status is at baseline.  Psychiatric:        Mood and Affect: Mood normal.        Behavior: Behavior normal.        Thought Content: Thought content normal.        Judgment: Judgment normal.    Assessment/Plan: 1. Post-COVID chronic cough Treat with ZPAK, advised to contact office if symptoms worsen or have not improved within 10 days Will send for imaging at that time - azithromycin (ZITHROMAX) 250 MG tablet; Take 2 tablets on day 1, then 1 tablet daily on days 2 through 5  Dispense: 6  tablet; Refill: 0  2. Moderate major depression (HCC) Symptoms better controlled, requesting refills - buPROPion (WELLBUTRIN SR) 100 MG 12 hr tablet; Take 1 tablet (100 mg total) by mouth daily.  Dispense: 90 tablet; Refill: 0  3. Primary insomnia Stable, continue with Lunesta as needed for insomnia - eszopiclone (LUNESTA) 2 MG TABS tablet; Take 1 tablet (2 mg total) by mouth at bedtime.  Dispense: 30 tablet; Refill: 0  General Counseling: Malajah verbalizes understanding of the findings of todays visit and agrees with plan of treatment. I have discussed any further diagnostic evaluation that may be needed or ordered today. We also reviewed her medications today. she has been encouraged to call the office with any questions or concerns that should arise related to todays visit.   Meds ordered this encounter  Medications  . azithromycin (ZITHROMAX) 250 MG tablet    Sig: Take 2 tablets on day 1, then 1 tablet daily on days 2 through 5    Dispense:  6 tablet    Refill:  0  . buPROPion (WELLBUTRIN SR) 100 MG 12 hr tablet    Sig: Take 1 tablet (100 mg total) by mouth daily.    Dispense:  90 tablet    Refill:  0  . eszopiclone (LUNESTA) 2 MG TABS tablet    Sig: Take 1 tablet (2 mg total) by mouth at bedtime.    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  This patient was seen by Theodoro Grist AGNP-C in Collaboration with Dr Lavera Guise as a part of collaborative care agreement     Tanna Furry. Jung Ingerson AGNP-C Internal medicine

## 2020-08-10 ENCOUNTER — Encounter: Payer: Self-pay | Admitting: Hospice and Palliative Medicine

## 2020-09-25 ENCOUNTER — Other Ambulatory Visit: Payer: Self-pay | Admitting: Internal Medicine

## 2020-09-25 ENCOUNTER — Telehealth: Payer: Self-pay

## 2020-09-25 DIAGNOSIS — F5101 Primary insomnia: Secondary | ICD-10-CM

## 2020-09-25 MED ORDER — ESZOPICLONE 2 MG PO TABS: 2.0000 mg | ORAL_TABLET | Freq: Every day | ORAL | 1 refills | Status: DC

## 2020-09-25 NOTE — Telephone Encounter (Signed)
done

## 2020-11-07 ENCOUNTER — Ambulatory Visit: Payer: BC Managed Care – PPO | Admitting: Nurse Practitioner

## 2020-11-07 ENCOUNTER — Other Ambulatory Visit: Payer: Self-pay

## 2020-11-07 ENCOUNTER — Encounter: Payer: Self-pay | Admitting: Nurse Practitioner

## 2020-11-07 VITALS — BP 139/86 | HR 73 | Temp 98.5°F | Resp 16 | Ht 60.0 in | Wt 161.6 lb

## 2020-11-07 DIAGNOSIS — N390 Urinary tract infection, site not specified: Secondary | ICD-10-CM

## 2020-11-07 DIAGNOSIS — R300 Dysuria: Secondary | ICD-10-CM

## 2020-11-07 DIAGNOSIS — F5101 Primary insomnia: Secondary | ICD-10-CM | POA: Diagnosis not present

## 2020-11-07 DIAGNOSIS — F321 Major depressive disorder, single episode, moderate: Secondary | ICD-10-CM | POA: Diagnosis not present

## 2020-11-07 DIAGNOSIS — E782 Mixed hyperlipidemia: Secondary | ICD-10-CM

## 2020-11-07 DIAGNOSIS — J301 Allergic rhinitis due to pollen: Secondary | ICD-10-CM

## 2020-11-07 DIAGNOSIS — E559 Vitamin D deficiency, unspecified: Secondary | ICD-10-CM

## 2020-11-07 DIAGNOSIS — Z0189 Encounter for other specified special examinations: Secondary | ICD-10-CM

## 2020-11-07 DIAGNOSIS — E538 Deficiency of other specified B group vitamins: Secondary | ICD-10-CM

## 2020-11-07 LAB — POCT URINALYSIS DIPSTICK
Bilirubin, UA: NEGATIVE
Blood, UA: POSITIVE
Glucose, UA: NEGATIVE
Ketones, UA: NEGATIVE
Leukocytes, UA: NEGATIVE
Nitrite, UA: NEGATIVE
Protein, UA: NEGATIVE
Spec Grav, UA: 1.01 (ref 1.010–1.025)
Urobilinogen, UA: NEGATIVE E.U./dL — AB
pH, UA: 6 (ref 5.0–8.0)

## 2020-11-07 MED ORDER — MONTELUKAST SODIUM 10 MG PO TABS: ORAL_TABLET | ORAL | 1 refills | Status: DC

## 2020-11-07 MED ORDER — SERTRALINE HCL 50 MG PO TABS: 75.0000 mg | ORAL_TABLET | Freq: Every day | ORAL | 2 refills | Status: DC

## 2020-11-07 MED ORDER — NITROFURANTOIN MONOHYD MACRO 100 MG PO CAPS: 100.0000 mg | ORAL_CAPSULE | Freq: Two times a day (BID) | ORAL | 0 refills | Status: DC

## 2020-11-07 MED ORDER — BUPROPION HCL ER (SR) 100 MG PO TB12: 100.0000 mg | ORAL_TABLET | Freq: Every day | ORAL | 0 refills | Status: DC

## 2020-11-07 MED ORDER — ESZOPICLONE 2 MG PO TABS: 2.0000 mg | ORAL_TABLET | Freq: Every day | ORAL | 2 refills | Status: DC

## 2020-11-07 NOTE — Progress Notes (Signed)
[facility] [place],  [address]  Internal MEDICINE  Office Visit Note  Patient Name: Kayla Jimenez  [id number]  [id number]  Date of Service: 11/07/2020  Chief Complaint  Patient presents with   Acute Visit    Poss uti, med refills     HPI Sabriel presents for an acute sick visit for UTI symptoms. She has a history of anxiety, kidney stones, BV, UTI and yeast infection. She has a surgical history of cholecystectomy and hysterectomy. She also is requesting medication refills.  She is experiencing urinary urgency and frequency. She reports suprapubic tenderness. Urine specimen sent for culture.  Karalyn is also re questing to have routine labs ordered.  The last time she had labs done was in November 2021.    Current Medication:  Outpatient Encounter Medications as of 11/07/2020  Medication Sig   Cranberry-Vitamin C (CRANBERRY CONCENTRATE/VITAMINC) 15000-100 MG CAPS Take by mouth.   cyanocobalamin (,VITAMIN B-12,) 1000 MCG/ML injection once aweek for 3 weeks than once a month   INSULIN SYRINGE 1CC/29G (SAFETY INSULIN SYRINGES) 29G X 1/2" 1 ML MISC Use for b12 injection   nitrofurantoin, macrocrystal-monohydrate, (MACROBID) 100 MG capsule Take 1 capsule (100 mg total) by mouth 2 (two) times daily. Use as instructed (Patient not taking: Reported on 11/15/2020)   Vitamin D, Ergocalciferol, (DRISDOL) 1.25 MG (50000 UNIT) CAPS capsule TAKE 1 CAPSULE BY MOUTH ONE TIME PER WEEK   [DISCONTINUED] buPROPion (WELLBUTRIN SR) 100 MG 12 hr tablet Take 1 tablet (100 mg total) by mouth daily.   [DISCONTINUED] eszopiclone (LUNESTA) 2 MG TABS tablet Take 1 tablet (2 mg total) by mouth at bedtime.   [DISCONTINUED] meloxicam (MOBIC) 7.5 MG tablet Take 7.5 mg by mouth 2 (two) times daily.   [DISCONTINUED] montelukast (SINGULAIR) 10 MG tablet TAKE 1 TABLET BY MOUTH EVERYDAY AT BEDTIME   [DISCONTINUED] sertraline (ZOLOFT) 50 MG tablet Take 1.5 tablets (75 mg total) by mouth at  bedtime.   buPROPion ER (WELLBUTRIN SR) 100 MG 12 hr tablet Take 1 tablet (100 mg total) by mouth daily.   montelukast (SINGULAIR) 10 MG tablet TAKE 1 TABLET BY MOUTH EVERYDAY AT BEDTIME   sertraline (ZOLOFT) 50 MG tablet Take 1.5 tablets (75 mg total) by mouth at bedtime.   [DISCONTINUED] eszopiclone (LUNESTA) 2 MG TABS tablet Take 1 tablet (2 mg total) by mouth at bedtime.   No facility-administered encounter medications on file as of 11/07/2020.      Medical History: Past Medical History:  Diagnosis Date   Anxiety 08/29/10   Bursitis    left foot   BV (bacterial vaginosis) 3/71/06   Complication of anesthesia 2004   vomited   Gross hematuria 07/08/2015   H/O bladder infections    H/O mumps    H/O toxoplasmosis    H/O varicella    History of kidney stones    Infections of kidney    several kidney stones, bladder infections   Kidney disease 09/03/2011   Left ureteral calculus with partial obstruction, passed 06/09/2014. CT did show perinephric stranding.    Menometrorrhagia 06/26/10   Plantar fasciitis    PMS (premenstrual syndrome) 07/10/11   Post - coital bleeding 08/29/10   Renal calculus, left    Sinus infection    UTI (lower urinary tract infection) 07/08/2015   Yeast infection      Vital Signs: BP 139/86   Pulse 73   Temp 98.5 F (36.9 C)   Resp 16   Ht 5' (1.524 m)  Wt 161 lb 9.6 oz (73.3 kg)   SpO2 98%   BMI 31.56 kg/m    Review of Systems  Constitutional:  Negative for chills, fatigue and fever.  HENT: Negative.    Respiratory: Negative.  Negative for cough, chest tightness, shortness of breath and wheezing.   Cardiovascular: Negative.  Negative for chest pain and palpitations.  Genitourinary:  Positive for difficulty urinating, dysuria, frequency and urgency.  Neurological:  Negative for dizziness, light-headedness and headaches.   Physical Exam Vitals reviewed.  Constitutional:      Appearance: Normal appearance.  HENT:     Head: Normocephalic and  atraumatic.  Cardiovascular:     Rate and Rhythm: Normal rate and regular rhythm.  Pulmonary:     Effort: Pulmonary effort is normal. No respiratory distress.  Abdominal:     Tenderness: There is abdominal tenderness in the suprapubic area. There is no right CVA tenderness or left CVA tenderness.  Skin:    General: Skin is warm and dry.     Capillary Refill: Capillary refill takes less than 2 seconds.  Neurological:     Mental Status: She is alert and oriented to person, place, and time.  Psychiatric:        Mood and Affect: Mood normal.        Behavior: Behavior normal.    Assessment/Plan: 1. Urinary tract infection without hematuria, site unspecified Empiric antibiotic treatment of UTI ordered, follw up as needed if symptoms worsen or do not improve.  - nitrofurantoin, macrocrystal-monohydrate, (MACROBID) 100 MG capsule; Take 1 capsule (100 mg total) by mouth 2 (two) times daily. Use as instructed (Patient not taking: Reported on 11/15/2020)  Dispense: 10 capsule; Refill: 0  2. Moderate major depression (HCC) Stable, refill ordered.  - buPROPion ER (WELLBUTRIN SR) 100 MG 12 hr tablet; Take 1 tablet (100 mg total) by mouth daily.  Dispense: 90 tablet; Refill: 0 - sertraline (ZOLOFT) 50 MG tablet; Take 1.5 tablets (75 mg total) by mouth at bedtime.  Dispense: 135 tablet; Refill: 2  3. Primary insomnia Lunesta refill ordered.   4. Dysuria C/o symptoms of UTI, urine specimen obtained.  - POCT urinalysis dipstick - CULTURE, URINE COMPREHENSIVE  5. Allergic rhinitis due to pollen, unspecified seasonality Stable, refill ordered.  - montelukast (SINGULAIR) 10 MG tablet; TAKE 1 TABLET BY MOUTH EVERYDAY AT BEDTIME  Dispense: 90 tablet; Refill: 1  6. Vitamin B12 deficiency Ruel out B12 deficiency - B12 and Folate Panel  7. Vitamin D deficiency Recheck vitamin D level.  - Vitamin D (25 hydroxy)  8. Mixed hyperlipidemia History of elevated cholesterol, recheck lipid panel - Lipid  Profile  9. Encounter for routine laboratory testing Routine testing ordered.  - CBC with Differential/Platelet - CMP14+EGFR - TSH + free T4   General Counseling: Oviya verbalizes understanding of the findings of todays visit and agrees with plan of treatment. I have discussed any further diagnostic evaluation that may be needed or ordered today. We also reviewed her medications today. she has been encouraged to call the office with any questions or concerns that should arise related to todays visit.    Counseling:    Orders Placed This Encounter  Procedures   CULTURE, URINE COMPREHENSIVE   Vitamin D (25 hydroxy)   CBC with Differential/Platelet   CMP14+EGFR   Lipid Profile   B12 and Folate Panel   TSH + free T4   POCT urinalysis dipstick    Meds ordered this encounter  Medications   nitrofurantoin, macrocrystal-monohydrate, (MACROBID)  100 MG capsule    Sig: Take 1 capsule (100 mg total) by mouth 2 (two) times daily. Use as instructed    Dispense:  10 capsule    Refill:  0   montelukast (SINGULAIR) 10 MG tablet    Sig: TAKE 1 TABLET BY MOUTH EVERYDAY AT BEDTIME    Dispense:  90 tablet    Refill:  1   buPROPion ER (WELLBUTRIN SR) 100 MG 12 hr tablet    Sig: Take 1 tablet (100 mg total) by mouth daily.    Dispense:  90 tablet    Refill:  0   sertraline (ZOLOFT) 50 MG tablet    Sig: Take 1.5 tablets (75 mg total) by mouth at bedtime.    Dispense:  135 tablet    Refill:  2   DISCONTD: eszopiclone (LUNESTA) 2 MG TABS tablet    Sig: Take 1 tablet (2 mg total) by mouth at bedtime.    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 ORS is 170.  Return in about 3 months (around 02/07/2021) for F/U, med refill, Sarrinah Gardin PCP. May schedule follow up in a couple of weeks to discuss lab results and medications for insomnia.   Time spent:30 Minutes Time spent with patient included reviewing progress notes, labs, imaging  studies, and discussing plan for follow up.   This patient was seen by Jonetta Osgood, FNP-C in collaboration with Dr. Clayborn Bigness as a part of collaborative care agreement.  Rajanee Schuelke R. Valetta Fuller, MSN, FNP-C Internal Medicine

## 2020-11-08 ENCOUNTER — Ambulatory Visit: Payer: BC Managed Care – PPO | Admitting: Nurse Practitioner

## 2020-11-08 LAB — BASIC METABOLIC PANEL: Glucose: 101

## 2020-11-09 ENCOUNTER — Telehealth: Payer: Self-pay

## 2020-11-09 LAB — CMP14+EGFR
ALT: 10 IU/L (ref 0–32)
AST: 10 IU/L (ref 0–40)
Albumin/Globulin Ratio: 1.6 (ref 1.2–2.2)
Albumin: 4.1 g/dL (ref 3.8–4.8)
Alkaline Phosphatase: 80 IU/L (ref 44–121)
BUN/Creatinine Ratio: 12 (ref 9–23)
BUN: 8 mg/dL (ref 6–24)
Bilirubin Total: 0.3 mg/dL (ref 0.0–1.2)
CO2: 23 mmol/L (ref 20–29)
Calcium: 9.1 mg/dL (ref 8.7–10.2)
Chloride: 102 mmol/L (ref 96–106)
Creatinine, Ser: 0.67 mg/dL (ref 0.57–1.00)
Globulin, Total: 2.6 g/dL (ref 1.5–4.5)
Glucose: 82 mg/dL (ref 65–99)
Potassium: 4.7 mmol/L (ref 3.5–5.2)
Sodium: 139 mmol/L (ref 134–144)
Total Protein: 6.7 g/dL (ref 6.0–8.5)
eGFR: 110 mL/min/{1.73_m2} (ref >59)

## 2020-11-09 LAB — CBC WITH DIFFERENTIAL/PLATELET
Basophils Absolute: 0.1 10*3/uL (ref 0.0–0.2)
Basos: 1 %
EOS (ABSOLUTE): 0.2 10*3/uL (ref 0.0–0.4)
Eos: 2 %
Hematocrit: 41.3 % (ref 34.0–46.6)
Hemoglobin: 14.3 g/dL (ref 11.1–15.9)
Immature Grans (Abs): 0 10*3/uL (ref 0.0–0.1)
Immature Granulocytes: 0 %
Lymphocytes Absolute: 2.6 10*3/uL (ref 0.7–3.1)
Lymphs: 25 %
MCH: 32.6 pg (ref 26.6–33.0)
MCHC: 34.6 g/dL (ref 31.5–35.7)
MCV: 94 fL (ref 79–97)
Monocytes Absolute: 0.6 10*3/uL (ref 0.1–0.9)
Monocytes: 6 %
Neutrophils Absolute: 6.6 10*3/uL (ref 1.4–7.0)
Neutrophils: 66 %
Platelets: 332 10*3/uL (ref 150–450)
RBC: 4.38 x10E6/uL (ref 3.77–5.28)
RDW: 11.5 % — ABNORMAL LOW (ref 11.7–15.4)
WBC: 10.2 10*3/uL (ref 3.4–10.8)

## 2020-11-09 LAB — LIPID PANEL
Chol/HDL Ratio: 5.9 ratio — ABNORMAL HIGH (ref 0.0–4.4)
Cholesterol, Total: 205 mg/dL — ABNORMAL HIGH (ref 100–199)
HDL: 35 mg/dL — ABNORMAL LOW (ref >39)
LDL Chol Calc (NIH): 126 mg/dL — ABNORMAL HIGH (ref 0–99)
Triglycerides: 245 mg/dL — ABNORMAL HIGH (ref 0–149)
VLDL Cholesterol Cal: 44 mg/dL — ABNORMAL HIGH (ref 5–40)

## 2020-11-09 LAB — B12 AND FOLATE PANEL
Folate: 7.5 ng/mL (ref >3.0)
Vitamin B-12: 333 pg/mL (ref 232–1245)

## 2020-11-09 LAB — TSH+FREE T4
Free T4: 1.21 ng/dL (ref 0.82–1.77)
TSH: 1.06 u[IU]/mL (ref 0.450–4.500)

## 2020-11-09 LAB — VITAMIN D 25 HYDROXY (VIT D DEFICIENCY, FRACTURES): Vit D, 25-Hydroxy: 37.6 ng/mL (ref 30.0–100.0)

## 2020-11-09 NOTE — Progress Notes (Signed)
I have reviewed the patient's lab results and would like to discuss them in person if possible. Please call the patient and schedule a follow up visit to discuss lab results in the next week or 2 at the most.

## 2020-11-09 NOTE — Telephone Encounter (Signed)
Spoke with pt that we have DAYVIGO '5mg'$  sample ready for pickup as per alyssa advised that stopped lunesta and try sample for Dayvigo  5 mg  at bedtime if not helping she can try 10 mg at bedtime and she can try first if helping we can send pres to phar

## 2020-11-12 ENCOUNTER — Ambulatory Visit: Payer: BC Managed Care – PPO | Admitting: Nurse Practitioner

## 2020-11-12 LAB — CULTURE, URINE COMPREHENSIVE

## 2020-11-15 ENCOUNTER — Other Ambulatory Visit: Payer: Self-pay

## 2020-11-15 ENCOUNTER — Ambulatory Visit: Payer: BC Managed Care – PPO | Admitting: Nurse Practitioner

## 2020-11-15 ENCOUNTER — Encounter: Payer: Self-pay | Admitting: Nurse Practitioner

## 2020-11-15 VITALS — BP 110/82 | HR 82 | Temp 98.4°F | Resp 16 | Ht 63.0 in | Wt 159.8 lb

## 2020-11-15 DIAGNOSIS — R7301 Impaired fasting glucose: Secondary | ICD-10-CM

## 2020-11-15 DIAGNOSIS — F5101 Primary insomnia: Secondary | ICD-10-CM | POA: Diagnosis not present

## 2020-11-15 DIAGNOSIS — E782 Mixed hyperlipidemia: Secondary | ICD-10-CM | POA: Diagnosis not present

## 2020-11-15 MED ORDER — BELSOMRA 5 MG PO TABS: 5.0000 mg | ORAL_TABLET | Freq: Every evening | ORAL | 0 refills | Status: DC | PRN

## 2020-11-15 NOTE — Progress Notes (Signed)
[facility] [place], [place] [address]  Internal MEDICINE  Office Visit Note  Patient Name: Kayla Jimenez  [id number]  [id number]  Date of Service: 11/15/2020  Chief Complaint  Patient presents with   Follow-up    Lab review    HPI Kayla Jimenez presents for a follow up visit to review lab results. She is also having trouble sleeping and would like to try some medication to help with that. She also has a history of impaired fasting glucose on prior labs, so will check A1C today. Her BMP, B12, folate, vitamin D, CBC and thyroid were wnl. Her lipid panel was abnormal but ASCVD risk was still low. Discussed statin therapy, OTC supplements and diet modifications.      Lipid Panel     Component Value Date/Time   CHOL 205 (H) 11/08/2020 1055   TRIG 245 (H) 11/08/2020 1055   HDL 35 (L) 11/08/2020 1055   CHOLHDL 5.9 (H) 11/08/2020 1055   LDLCALC 126 (H) 11/08/2020 1055   LABVLDL 44 (H) 11/08/2020 1055     The 10-year ASCVD risk score Mikey Bussing DC Jr., et al., 2013) is: 1.3%   Values used to calculate the score:     Age: 45 years     Sex: Female     Is Non-Hispanic African American: No     Diabetic: No     Tobacco smoker: No     Systolic Blood Pressure: A999333 mmHg     Is BP treated: No     HDL Cholesterol: 35 mg/dL     Total Cholesterol: 205 mg/dL    Current Medication: Outpatient Encounter Medications as of 11/15/2020  Medication Sig   buPROPion ER (WELLBUTRIN SR) 100 MG 12 hr tablet Take 1 tablet (100 mg total) by mouth daily.   Cranberry-Vitamin C (CRANBERRY CONCENTRATE/VITAMINC) 15000-100 MG CAPS Take by mouth.   cyanocobalamin (,VITAMIN B-12,) 1000 MCG/ML injection once aweek for 3 weeks than once a month   INSULIN SYRINGE 1CC/29G (SAFETY INSULIN SYRINGES) 29G X 1/2" 1 ML MISC Use for b12 injection   montelukast (SINGULAIR) 10 MG tablet TAKE 1 TABLET BY MOUTH EVERYDAY AT BEDTIME   sertraline (ZOLOFT) 50 MG tablet Take 1.5 tablets (75 mg total) by mouth  at bedtime.   Suvorexant (BELSOMRA) 5 MG TABS Take 5 mg by mouth at bedtime as needed (for insomnia).   Vitamin D, Ergocalciferol, (DRISDOL) 1.25 MG (50000 UNIT) CAPS capsule TAKE 1 CAPSULE BY MOUTH ONE TIME PER WEEK   [DISCONTINUED] eszopiclone (LUNESTA) 2 MG TABS tablet Take 1 tablet (2 mg total) by mouth at bedtime.   nitrofurantoin, macrocrystal-monohydrate, (MACROBID) 100 MG capsule Take 1 capsule (100 mg total) by mouth 2 (two) times daily. Use as instructed (Patient not taking: Reported on 11/15/2020)   No facility-administered encounter medications on file as of 11/15/2020.    Surgical History: Past Surgical History:  Procedure Laterality Date   ABDOMINAL HYSTERECTOMY     ANTERIOR AND POSTERIOR REPAIR  03/16/2012   Procedure: ANTERIOR (CYSTOCELE) AND POSTERIOR REPAIR (RECTOCELE);  Surgeon: [name], MD;  Location: [facility];  Service: Gynecology;  Laterality: N/A;  anterior repair/cysto   BLADDER SUSPENSION  03/16/2012   Procedure: TRANSVAGINAL TAPE (TVT) PROCEDURE;  Surgeon: [name], MD;  Location: [facility];  Service: Gynecology;  Laterality: N/A;   BREAST REDUCTION SURGERY  6/04   CHOLECYSTECTOMY  06/28/14   CYSTOSCOPY  03/16/2012   Procedure: CYSTOSCOPY;  Surgeon: [name], MD;  Location: [facility]  [facility];  Service: Gynecology;  Laterality: N/A;   CYSTOSCOPY  03/16/2017   Procedure: CYSTOSCOPY;  Surgeon: Everett Graff, MD;  Location: [facility];  Service: Gynecology;;   [place]  03/16/2012   Procedure: DILATATION & CURETTAGE/HYSTEROSCOPY WITH NOVASURE ABLATION;  Surgeon: [name], MD;  Location: [facility];  Service: Gynecology;  Laterality: N/A;   TONSILLECTOMY AND ADENOIDECTOMY     VAGINAL HYSTERECTOMY Bilateral 03/16/2017   Procedure: HYSTERECTOMY VAGINAL Bilateral Salpingectomy ;  Surgeon: Everett Graff, MD;  Location: [facility];  Service: Gynecology;  Laterality: Bilateral;    Medical History: Past Medical  History:  Diagnosis Date   Anxiety 08/29/10   Bursitis    left foot   BV (bacterial vaginosis) [id number]   Complication of anesthesia 2004   vomited   Gross hematuria 07/08/2015   H/O bladder infections    H/O mumps    H/O toxoplasmosis    H/O varicella    History of kidney stones    Infections of kidney    several kidney stones, bladder infections   Kidney disease 09/03/2011   Left ureteral calculus with partial obstruction, passed 06/09/2014. CT did show perinephric stranding.    Menometrorrhagia 06/26/10   Plantar fasciitis    PMS (premenstrual syndrome) 07/10/11   Post - coital bleeding 08/29/10   Renal calculus, left    Sinus infection    UTI (lower urinary tract infection) 07/08/2015   Yeast infection     Family History: Family History  Problem Relation Age of Onset   Cancer Other        breast   Cancer Mother        cervical   Depression Mother    Drug abuse Mother    Anemia Mother        blood transfusion   Migraines Mother     Social History   Socioeconomic History   Marital status: Married    Spouse name: Not on file   Number of children: Not on file   Years of education: Not on file   Highest education level: Not on file  Occupational History   Not on file  Tobacco Use   Smoking status: Never   Smokeless tobacco: Never  Vaping Use   Vaping Use: Never used  Substance and Sexual Activity   Alcohol use: No   Drug use: No   Sexual activity: Yes    Birth control/protection: Surgical    Comment: pt spouse had vasec.   Other Topics Concern   Not on file  Social History Narrative   Not on file   Social Determinants of Health   Financial Resource Strain: Not on file  Food Insecurity: Not on file  Transportation Needs: Not on file  Physical Activity: Not on file  Stress: Not on file  Social Connections: Not on file  Intimate Partner Violence: Not on file      Review of Systems  Constitutional:  Negative for chills, fatigue and unexpected weight  change.  HENT:  Negative for congestion, rhinorrhea, sneezing and sore throat.   Eyes:  Negative for redness.  Respiratory:  Negative for cough, chest tightness and shortness of breath.   Cardiovascular:  Negative for chest pain and palpitations.  Gastrointestinal:  Negative for abdominal pain, constipation, diarrhea, nausea and vomiting.  Genitourinary:  Negative for dysuria and frequency.  Musculoskeletal:  Negative for arthralgias, back pain, joint swelling and neck pain.  Skin:  Negative for rash.  Neurological: Negative.  Negative  for tremors and numbness.  Hematological:  Negative for adenopathy. Does not bruise/bleed easily.  Psychiatric/Behavioral:  Negative for behavioral problems (Depression), sleep disturbance and suicidal ideas. The patient is not nervous/anxious.    Vital Signs: BP 110/82   Pulse 82   Temp 98.4 F (36.9 C)   Resp 16   Ht '5\' 3"'$  (1.6 m)   Wt 159 lb 12.8 oz (72.5 kg)   SpO2 99%   BMI 28.31 kg/m    Physical Exam Vitals reviewed.  Constitutional:      General: She is not in acute distress.    Appearance: Normal appearance. She is obese. She is not ill-appearing.  HENT:     Head: Normocephalic and atraumatic.  Cardiovascular:     Rate and Rhythm: Normal rate and regular rhythm.  Pulmonary:     Effort: Pulmonary effort is normal. No respiratory distress.  Skin:    General: Skin is warm and dry.     Capillary Refill: Capillary refill takes less than 2 seconds.  Neurological:     Mental Status: She is alert and oriented to person, place, and time.  Psychiatric:        Mood and Affect: Mood normal.        Behavior: Behavior normal.    Assessment/Plan: 1. Primary insomnia Trialed Belsomra and it worked for her, prescription sent to pharmacy.  - Suvorexant (BELSOMRA) 5 MG TABS; Take 5 mg by mouth at bedtime as needed (for insomnia).  Dispense: 30 tablet; Refill: 0  2. Impaired fasting glucose A1C was normal. - POCT glycosylated hemoglobin (Hb  A1C)  3. Mixed hyperlipidemia Declined statin therapy. Requested to try diet modifications and OTC supplements. Recommended patient to take a fish oil supplement 1000 mg daily. Discussed diet and lifestyle modifications to help improve cholesterol levels.    General Counseling: Char verbalizes understanding of the findings of todays visit and agrees with plan of treatment. I have discussed any further diagnostic evaluation that may be needed or ordered today. We also reviewed her medications today. she has been encouraged to call the office with any questions or concerns that should arise related to todays visit.    Orders Placed This Encounter  Procedures   POCT glycosylated hemoglobin (Hb A1C)    Meds ordered this encounter  Medications   Suvorexant (BELSOMRA) 5 MG TABS    Sig: Take 5 mg by mouth at bedtime as needed (for insomnia).    Dispense:  30 tablet    Refill:  0    Return in about 4 weeks (around 12/13/2020) for F/U, med refill, Weight loss, Breindel Collier PCP and metabolic test.   Total time spent:30 Minutes Time spent includes review of chart, medications, test results, and follow up plan with the patient.   [place] Controlled Substance Database was reviewed by me.  This patient was seen by Jonetta Osgood, FNP-C in collaboration with Dr. Clayborn Bigness as a part of collaborative care agreement.   Tray Klayman R. Valetta Fuller, MSN, FNP-C Internal medicine

## 2020-11-23 ENCOUNTER — Ambulatory Visit: Payer: BC Managed Care – PPO | Admitting: Nurse Practitioner

## 2020-11-25 ENCOUNTER — Encounter: Payer: Self-pay | Admitting: Internal Medicine

## 2020-11-28 LAB — POCT GLYCOSYLATED HEMOGLOBIN (HGB A1C): Hemoglobin A1C: 5.2 % (ref 4.0–5.6)

## 2020-12-12 ENCOUNTER — Telehealth: Payer: Self-pay

## 2020-12-12 NOTE — Telephone Encounter (Signed)
PA for BELSOMRA 5 mg was sent on 12/05/20 and came back approved.  Coverage from 12/05/20 to 12/06/2023.

## 2020-12-17 ENCOUNTER — Ambulatory Visit: Payer: BC Managed Care – PPO | Admitting: Nurse Practitioner

## 2020-12-17 ENCOUNTER — Encounter: Payer: Self-pay | Admitting: Nurse Practitioner

## 2020-12-17 ENCOUNTER — Other Ambulatory Visit: Payer: Self-pay

## 2020-12-17 VITALS — BP 130/80 | HR 72 | Temp 97.8°F | Resp 16 | Ht 63.0 in | Wt 157.0 lb

## 2020-12-17 DIAGNOSIS — E663 Overweight: Secondary | ICD-10-CM

## 2020-12-17 DIAGNOSIS — F5101 Primary insomnia: Secondary | ICD-10-CM | POA: Diagnosis not present

## 2020-12-17 DIAGNOSIS — R300 Dysuria: Secondary | ICD-10-CM | POA: Diagnosis not present

## 2020-12-17 DIAGNOSIS — R7301 Impaired fasting glucose: Secondary | ICD-10-CM | POA: Diagnosis not present

## 2020-12-17 DIAGNOSIS — Z6827 Body mass index (BMI) 27.0-27.9, adult: Secondary | ICD-10-CM

## 2020-12-17 LAB — POCT URINALYSIS DIPSTICK
Glucose, UA: NEGATIVE
Ketones, UA: NEGATIVE
Leukocytes, UA: NEGATIVE
Nitrite, UA: NEGATIVE
Protein, UA: NEGATIVE
Spec Grav, UA: >=1.03 — AB (ref 1.010–1.025)
Urobilinogen, UA: 0.2 E.U./dL
pH, UA: 5 (ref 5.0–8.0)

## 2020-12-17 MED ORDER — ESZOPICLONE 2 MG PO TABS: 2.0000 mg | ORAL_TABLET | Freq: Every evening | ORAL | 2 refills | Status: DC | PRN

## 2020-12-17 MED ORDER — RYBELSUS 3 MG PO TABS: 3.0000 mg | ORAL_TABLET | Freq: Every day | ORAL | 3 refills | Status: DC

## 2020-12-17 NOTE — Progress Notes (Signed)
[facility] [place], [place] [address]  Internal MEDICINE  Office Visit Note  Patient Name: Kayla Jimenez  [id number]  [id number]  Date of Service: 12/17/2020  Chief Complaint  Patient presents with  . Follow-up    Recheck UA  . Medical Management of Chronic Issues    Weight management    HPI Kayla Jimenez presents for follow up visit for weight loss management. She also tried belsomra for insomnia but it did not help for long. She wants to go back on lunesta. Labs were done, wants to try Rybelsus for weight loss.    Current Medication: Outpatient Encounter Medications as of 12/17/2020  Medication Sig  . buPROPion ER (WELLBUTRIN SR) 100 MG 12 hr tablet Take 1 tablet (100 mg total) by mouth daily.  . Cranberry-Vitamin C (CRANBERRY CONCENTRATE/VITAMINC) 15000-100 MG CAPS Take by mouth.  . cyanocobalamin (,VITAMIN B-12,) 1000 MCG/ML injection once aweek for 3 weeks than once a month  . INSULIN SYRINGE 1CC/29G (SAFETY INSULIN SYRINGES) 29G X 1/2" 1 ML MISC Use for b12 injection  . montelukast (SINGULAIR) 10 MG tablet TAKE 1 TABLET BY MOUTH EVERYDAY AT BEDTIME  . Semaglutide (RYBELSUS) 3 MG TABS Take 3 mg by mouth daily. On an empty stomach with no more than 4 oz of water, may eat, drink and take other medications 30 minutes later  . sertraline (ZOLOFT) 50 MG tablet Take 1.5 tablets (75 mg total) by mouth at bedtime.  . Vitamin D, Ergocalciferol, (DRISDOL) 1.25 MG (50000 UNIT) CAPS capsule TAKE 1 CAPSULE BY MOUTH ONE TIME PER WEEK  . [DISCONTINUED] eszopiclone (LUNESTA) 2 MG TABS tablet Take 2 mg by mouth at bedtime as needed for sleep. Take immediately before bedtime  . [DISCONTINUED] Eszopiclone 3 MG TABS Take 3 mg by mouth at bedtime. Take immediately before bedtime  . [DISCONTINUED] nitrofurantoin, macrocrystal-monohydrate, (MACROBID) 100 MG capsule Take 1 capsule (100 mg total) by mouth 2 (two) times daily. Use as instructed  . [DISCONTINUED] Suvorexant  (BELSOMRA) 5 MG TABS Take 5 mg by mouth at bedtime as needed (for insomnia).  . eszopiclone (LUNESTA) 2 MG TABS tablet Take 1 tablet (2 mg total) by mouth at bedtime as needed for sleep. Take immediately before bedtime   No facility-administered encounter medications on file as of 12/17/2020.    Surgical History: Past Surgical History:  Procedure Laterality Date  . ABDOMINAL HYSTERECTOMY    . ANTERIOR AND POSTERIOR REPAIR  03/16/2012   Procedure: ANTERIOR (CYSTOCELE) AND POSTERIOR REPAIR (RECTOCELE);  Surgeon: [name], MD;  Location: [facility];  Service: Gynecology;  Laterality: N/A;  anterior repair/cysto  . BLADDER SUSPENSION  03/16/2012   Procedure: TRANSVAGINAL TAPE (TVT) PROCEDURE;  Surgeon: [name], MD;  Location: [facility];  Service: Gynecology;  Laterality: N/A;  . BREAST REDUCTION SURGERY  6/04  . CHOLECYSTECTOMY  06/28/14  . CYSTOSCOPY  03/16/2012   Procedure: CYSTOSCOPY;  Surgeon: [name], MD;  Location: [facility];  Service: Gynecology;  Laterality: N/A;  . CYSTOSCOPY  03/16/2017   Procedure: CYSTOSCOPY;  Surgeon: Everett Graff, MD;  Location: [facility];  Service: Gynecology;;  . Murrell Redden & CURRETTAGE/HYSTROSCOPY WITH NOVASURE ABLATION  03/16/2012   Procedure: DILATATION & CURETTAGE/HYSTEROSCOPY WITH NOVASURE ABLATION;  Surgeon: [name], MD;  Location: [facility];  Service: Gynecology;  Laterality: N/A;  . TONSILLECTOMY AND ADENOIDECTOMY    . VAGINAL HYSTERECTOMY Bilateral 03/16/2017   Procedure: HYSTERECTOMY VAGINAL Bilateral Salpingectomy ;  Surgeon: Everett Graff, MD;  Location: [facility]  [facility];  Service: Gynecology;  Laterality: Bilateral;    Medical History: Past Medical History:  Diagnosis Date  . Anxiety 08/29/10  . Bursitis    left foot  . BV (bacterial vaginosis) 08/29/10  . Complication of anesthesia 2004   vomited  . Gross hematuria 07/08/2015  . H/O bladder infections   . H/O mumps   . H/O toxoplasmosis   . H/O varicella   . History of kidney  stones   . Infections of kidney    several kidney stones, bladder infections  . Kidney disease 09/03/2011   Left ureteral calculus with partial obstruction, passed 06/09/2014. CT did show perinephric stranding.   . Menometrorrhagia 06/26/10  . Plantar fasciitis   . PMS (premenstrual syndrome) 07/10/11  . Post - coital bleeding 08/29/10  . Renal calculus, left   . Sinus infection   . UTI (lower urinary tract infection) 07/08/2015  . Yeast infection     Family History: Family History  Problem Relation Age of Onset  . Cancer Other        breast  . Cancer Mother        cervical  . Depression Mother   . Drug abuse Mother   . Anemia Mother        blood transfusion  . Migraines Mother     Social History   Socioeconomic History  . Marital status: Married    Spouse name: Not on file  . Number of children: Not on file  . Years of education: Not on file  . Highest education level: Not on file  Occupational History  . Not on file  Tobacco Use  . Smoking status: Never  . Smokeless tobacco: Never  Vaping Use  . Vaping Use: Never used  Substance and Sexual Activity  . Alcohol use: No  . Drug use: No  . Sexual activity: Yes    Birth control/protection: Surgical    Comment: pt spouse had vasec.   Other Topics Concern  . Not on file  Social History Narrative  . Not on file   Social Determinants of Health   Financial Resource Strain: Not on file  Food Insecurity: Not on file  Transportation Needs: Not on file  Physical Activity: Not on file  Stress: Not on file  Social Connections: Not on file  Intimate Partner Violence: Not on file      Review of Systems  Constitutional:  Negative for chills, fatigue and unexpected weight change.  HENT:  Negative for congestion, rhinorrhea, sneezing and sore throat.   Eyes:  Negative for redness.  Respiratory:  Negative for cough, chest tightness and shortness of breath.   Cardiovascular:  Negative for chest pain and palpitations.   Gastrointestinal:  Negative for abdominal pain, constipation, diarrhea, nausea and vomiting.  Genitourinary:  Negative for dysuria and frequency.  Musculoskeletal:  Negative for arthralgias, back pain, joint swelling and neck pain.  Skin:  Negative for rash.  Neurological: Negative.  Negative for tremors and numbness.  Hematological:  Negative for adenopathy. Does not bruise/bleed easily.  Psychiatric/Behavioral:  Negative for behavioral problems (Depression), sleep disturbance and suicidal ideas. The patient is not nervous/anxious.    Vital Signs: BP 130/80   Pulse 72   Temp 97.8 F (36.6 C)   Resp 16   Ht '5\' 3"'$  (1.6 m)   Wt 157 lb (71.2 kg)   SpO2 98%   BMI 27.81 kg/m    Physical Exam Vitals reviewed.  Constitutional:      General:  She is not in acute distress.    Appearance: Normal appearance. She is obese. She is not ill-appearing.  HENT:     Head: Normocephalic and atraumatic.  Eyes:     Extraocular Movements: Extraocular movements intact.     Pupils: Pupils are equal, round, and reactive to light.  Cardiovascular:     Rate and Rhythm: Normal rate and regular rhythm.  Pulmonary:     Effort: Pulmonary effort is normal. No respiratory distress.  Neurological:     Mental Status: She is alert and oriented to person, place, and time.     Cranial Nerves: No cranial nerve deficit.     Coordination: Coordination normal.     Gait: Gait normal.  Psychiatric:        Mood and Affect: Mood normal.        Behavior: Behavior normal.     Assessment/Plan: 1. Impaired fasting glucose Start on Rybelsus - Semaglutide (RYBELSUS) 3 MG TABS; Take 3 mg by mouth daily. On an empty stomach with no more than 4 oz of water, may eat, drink and take other medications 30 minutes later  Dispense: 30 tablet; Refill: 3  2. Primary insomnia Started back on lunesta - eszopiclone (LUNESTA) 2 MG TABS tablet; Take 1 tablet (2 mg total) by mouth at bedtime as needed for sleep. Take immediately  before bedtime  Dispense: 30 tablet; Refill: 2  3. Dysuria Recent UTI, recheck for resolution.  - POCT Urinalysis Dipstick - CULTURE, URINE COMPREHENSIVE  4. Overweight with body mass index (BMI) of 27 to 27.9 in adult Starting on rybelsus to aid in weight loss. Follow up in 4 weeks.    General Counseling: Kayla Jimenez verbalizes understanding of the findings of todays visit and agrees with plan of treatment. I have discussed any further diagnostic evaluation that may be needed or ordered today. We also reviewed her medications today. she has been encouraged to call the office with any questions or concerns that should arise related to todays visit.    Orders Placed This Encounter  Procedures  . CULTURE, URINE COMPREHENSIVE  . POCT Urinalysis Dipstick    Meds ordered this encounter  Medications  . eszopiclone (LUNESTA) 2 MG TABS tablet    Sig: Take 1 tablet (2 mg total) by mouth at bedtime as needed for sleep. Take immediately before bedtime    Dispense:  30 tablet    Refill:  2  . Semaglutide (RYBELSUS) 3 MG TABS    Sig: Take 3 mg by mouth daily. On an empty stomach with no more than 4 oz of water, may eat, drink and take other medications 30 minutes later    Dispense:  30 tablet    Refill:  3    Return in about 1 month (around 01/16/2021) for F/U, Weight loss, Leeland Lovelady PCP.   Total time spent:20 Minutes Time spent includes review of chart, medications, test results, and follow up plan with the patient.   [place] Controlled Substance Database was reviewed by me.  This patient was seen by Jonetta Osgood, FNP-C in collaboration with Dr. Clayborn Bigness as a part of collaborative care agreement.   Dorsel Flinn R. Valetta Fuller, MSN, FNP-C Internal medicine

## 2020-12-20 LAB — CULTURE, URINE COMPREHENSIVE

## 2021-01-17 ENCOUNTER — Ambulatory Visit: Payer: BC Managed Care – PPO | Admitting: Physician Assistant

## 2021-01-17 ENCOUNTER — Ambulatory Visit
Admission: RE | Admit: 2021-01-17 | Discharge: 2021-01-17 | Disposition: A | Discharge status: Home / Self Care | Payer: BC Managed Care – PPO | Source: Ambulatory Visit | Attending: Physician Assistant | Admitting: Physician Assistant

## 2021-01-17 ENCOUNTER — Other Ambulatory Visit: Payer: Self-pay

## 2021-01-17 DIAGNOSIS — R1031 Right lower quadrant pain: Secondary | ICD-10-CM | POA: Diagnosis not present

## 2021-01-17 DIAGNOSIS — R109 Unspecified abdominal pain: Secondary | ICD-10-CM

## 2021-01-17 DIAGNOSIS — R300 Dysuria: Secondary | ICD-10-CM

## 2021-01-17 DIAGNOSIS — Z87442 Personal history of urinary calculi: Secondary | ICD-10-CM

## 2021-01-17 LAB — POCT URINALYSIS DIPSTICK
Bilirubin, UA: NEGATIVE
Blood, UA: NEGATIVE
Glucose, UA: NEGATIVE
Ketones, UA: NEGATIVE
Leukocytes, UA: NEGATIVE
Nitrite, UA: NEGATIVE
Protein, UA: NEGATIVE
Spec Grav, UA: >=1.03 — AB (ref 1.010–1.025)
Urobilinogen, UA: 2 E.U./dL — AB
pH, UA: 6 (ref 5.0–8.0)

## 2021-01-17 MED ORDER — PHENAZOPYRIDINE HCL 100 MG PO TABS: 100.0000 mg | ORAL_TABLET | Freq: Three times a day (TID) | ORAL | 0 refills | Status: DC | PRN

## 2021-01-17 MED ORDER — TRAMADOL HCL 50 MG PO TABS: 50.0000 mg | ORAL_TABLET | Freq: Three times a day (TID) | ORAL | 0 refills | Status: AC | PRN

## 2021-01-17 NOTE — Progress Notes (Signed)
[facility] [place], [place] [address]  Internal MEDICINE  Office Visit Note  Patient Name: Kayla Jimenez  [id number]  [id number]  Date of Service: 01/20/2021  Chief Complaint  Patient presents with   Acute Visit   Urinary Tract Infection     HPI Pt is here for a sick visit. -urinary Frequency with reduced volume and burning after urination but not during that has worsened the last few days. She also has severe right flank pain that radiates into right groin and lower abdomen. -Hx of kidney stones and followed by urology. Doubled over in pain yesterday after work. -Several recent encounters with UTI symptoms and treated with ABX, yet cultures came back clear. UA in office did not show overt signs of UTI, but will still send for culture. Discussed symptoms seem more in line with kidney stones, especially given prior hx and increasing pain.  Current Medication:  Outpatient Encounter Medications as of 01/17/2021  Medication Sig   phenazopyridine (PYRIDIUM) 100 MG tablet Take 1 tablet (100 mg total) by mouth 3 (three) times daily as needed for pain.   traMADol (ULTRAM) 50 MG tablet Take 1 tablet (50 mg total) by mouth every 8 (eight) hours as needed for up to 7 days.   buPROPion ER (WELLBUTRIN SR) 100 MG 12 hr tablet Take 1 tablet (100 mg total) by mouth daily.   Cranberry-Vitamin C (CRANBERRY CONCENTRATE/VITAMINC) 15000-100 MG CAPS Take by mouth.   cyanocobalamin (,VITAMIN B-12,) 1000 MCG/ML injection once aweek for 3 weeks than once a month   eszopiclone (LUNESTA) 2 MG TABS tablet Take 1 tablet (2 mg total) by mouth at bedtime as needed for sleep. Take immediately before bedtime   INSULIN SYRINGE 1CC/29G (SAFETY INSULIN SYRINGES) 29G X 1/2" 1 ML MISC Use for b12 injection   montelukast (SINGULAIR) 10 MG tablet TAKE 1 TABLET BY MOUTH EVERYDAY AT BEDTIME   Semaglutide (RYBELSUS) 3 MG TABS Take 3 mg by mouth daily. On an empty stomach with no more than 4  oz of water, may eat, drink and take other medications 30 minutes later   sertraline (ZOLOFT) 50 MG tablet Take 1.5 tablets (75 mg total) by mouth at bedtime.   Vitamin D, Ergocalciferol, (DRISDOL) 1.25 MG (50000 UNIT) CAPS capsule TAKE 1 CAPSULE BY MOUTH ONE TIME PER WEEK   No facility-administered encounter medications on file as of 01/17/2021.      Medical History: Past Medical History:  Diagnosis Date   Anxiety 08/29/10   Bursitis    left foot   BV (bacterial vaginosis) 9/52/84   Complication of anesthesia 2004   vomited   Gross hematuria 07/08/2015   H/O bladder infections    H/O mumps    H/O toxoplasmosis    H/O varicella    History of kidney stones    Infections of kidney    several kidney stones, bladder infections   Kidney disease 09/03/2011   Left ureteral calculus with partial obstruction, passed 06/09/2014. CT did show perinephric stranding.    Menometrorrhagia 06/26/10   Plantar fasciitis    PMS (premenstrual syndrome) 07/10/11   Post - coital bleeding 08/29/10   Renal calculus, left    Sinus infection    UTI (lower urinary tract infection) 07/08/2015   Yeast infection      Vital Signs: BP 127/85   Pulse 80   Temp 97.8 F (36.6 C)   Resp 16   Ht 5\' 3"  (1.6 m)   Wt 157 lb (71.2  kg)   SpO2 98%   BMI 27.81 kg/m    Review of Systems  Constitutional:  Negative for fatigue and fever.  HENT:  Negative for congestion, mouth sores and postnasal drip.   Respiratory:  Negative for cough.   Cardiovascular:  Negative for chest pain.  Genitourinary:  Positive for dysuria, flank pain, frequency and pelvic pain. Negative for hematuria and vaginal bleeding.  Psychiatric/Behavioral: Negative.     Physical Exam Vitals and nursing note reviewed.  Constitutional:      General: She is not in acute distress.    Appearance: She is well-developed. She is not diaphoretic.  HENT:     Head: Normocephalic and atraumatic.     Mouth/Throat:     Pharynx: No oropharyngeal  exudate.  Eyes:     Pupils: Pupils are equal, round, and reactive to light.  Neck:     Thyroid: No thyromegaly.     Vascular: No JVD.     Trachea: No tracheal deviation.  Cardiovascular:     Rate and Rhythm: Normal rate and regular rhythm.     Heart sounds: Normal heart sounds. No murmur heard.   No friction rub. No gallop.  Pulmonary:     Effort: Pulmonary effort is normal. No respiratory distress.     Breath sounds: No wheezing or rales.  Chest:     Chest wall: No tenderness.  Abdominal:     General: Bowel sounds are normal.     Palpations: Abdomen is soft.     Tenderness: There is abdominal tenderness. There is right CVA tenderness. There is no left CVA tenderness.  Musculoskeletal:        General: Normal range of motion.     Cervical back: Normal range of motion and neck supple.  Lymphadenopathy:     Cervical: No cervical adenopathy.  Skin:    General: Skin is warm and dry.  Neurological:     Mental Status: She is alert and oriented to person, place, and time.     Cranial Nerves: No cranial nerve deficit.  Psychiatric:        Behavior: Behavior normal.        Thought Content: Thought content normal.        Judgment: Judgment normal.      Assessment/Plan: 1. Right flank pain Based on symptoms and history of stones ordered CT renal stone study, however insurance denied this even after peer-to-peer discussion to try to appeal decision.  During peer to peer they advised that a renal ultrasound must be ordered prior to CT for authorization, therefore renal ultrasound was ordered.  Upon reviewing results renal ultrasound did show a 6 mm nonobstructing calculus mid right kidney with no hydronephrosis and was otherwise unremarkable.  Called patient to discuss results and sent Pyridium and tramadol to be used for pain management and advised that patient continue to stay well-hydrated.  Patient also advised to contact urologist for further evaluation and management given size of  stone and to go to the ED if any acute worsening - CT RENAL STONE STUDY; Future - US Renal; Future - traMADol (ULTRAM) 50 MG tablet; Take 1 tablet (50 mg total) by mouth every 8 (eight) hours as needed for up to 7 days.  Dispense: 21 tablet; Refill: 0 - phenazopyridine (PYRIDIUM) 100 MG tablet; Take 1 tablet (100 mg total) by mouth 3 (three) times daily as needed for pain.  Dispense: 10 tablet; Refill: 0  2. Right lower quadrant abdominal pain - CT RENAL STONE  STUDY; Future - US Renal; Future - traMADol (ULTRAM) 50 MG tablet; Take 1 tablet (50 mg total) by mouth every 8 (eight) hours as needed for up to 7 days.  Dispense: 21 tablet; Refill: 0 - phenazopyridine (PYRIDIUM) 100 MG tablet; Take 1 tablet (100 mg total) by mouth 3 (three) times daily as needed for pain.  Dispense: 10 tablet; Refill: 0  3. History of kidney stones Followed by urology and previously passed on their own  4. Dysuria - POCT Urinalysis Dipstick - CULTURE, URINE COMPREHENSIVE - CT RENAL STONE STUDY; Future - US Renal; Future - traMADol (ULTRAM) 50 MG tablet; Take 1 tablet (50 mg total) by mouth every 8 (eight) hours as needed for up to 7 days.  Dispense: 21 tablet; Refill: 0 - phenazopyridine (PYRIDIUM) 100 MG tablet; Take 1 tablet (100 mg total) by mouth 3 (three) times daily as needed for pain.  Dispense: 10 tablet; Refill: 0   General Counseling: Elleanor verbalizes understanding of the findings of todays visit and agrees with plan of treatment. I have discussed any further diagnostic evaluation that may be needed or ordered today. We also reviewed her medications today. she has been encouraged to call the office with any questions or concerns that should arise related to todays visit.    Counseling:    Orders Placed This Encounter  Procedures   CULTURE, URINE COMPREHENSIVE   CT RENAL STONE STUDY   US Renal   POCT Urinalysis Dipstick    Meds ordered this encounter  Medications   traMADol (ULTRAM) 50 MG  tablet    Sig: Take 1 tablet (50 mg total) by mouth every 8 (eight) hours as needed for up to 7 days.    Dispense:  21 tablet    Refill:  0   phenazopyridine (PYRIDIUM) 100 MG tablet    Sig: Take 1 tablet (100 mg total) by mouth 3 (three) times daily as needed for pain.    Dispense:  10 tablet    Refill:  0    Time spent:45 Minutes

## 2021-01-21 ENCOUNTER — Encounter: Payer: Self-pay | Admitting: Physician Assistant

## 2021-01-21 ENCOUNTER — Other Ambulatory Visit: Payer: Self-pay

## 2021-01-21 ENCOUNTER — Ambulatory Visit: Payer: BC Managed Care – PPO | Admitting: Physician Assistant

## 2021-01-21 VITALS — BP 156/92 | HR 93 | Ht 63.0 in | Wt 157.0 lb

## 2021-01-21 DIAGNOSIS — N200 Calculus of kidney: Secondary | ICD-10-CM | POA: Diagnosis not present

## 2021-01-21 DIAGNOSIS — R339 Retention of urine, unspecified: Secondary | ICD-10-CM | POA: Diagnosis not present

## 2021-01-21 LAB — MICROSCOPIC EXAMINATION
Epithelial Cells (non renal): >10 /hpf — ABNORMAL HIGH (ref 0–10)
RBC, Urine: NONE SEEN /hpf (ref 0–2)

## 2021-01-21 LAB — BLADDER SCAN AMB NON-IMAGING

## 2021-01-21 LAB — URINALYSIS, COMPLETE
Bilirubin, UA: NEGATIVE
Glucose, UA: NEGATIVE
Ketones, UA: NEGATIVE
Leukocytes,UA: NEGATIVE
Nitrite, UA: NEGATIVE
Protein,UA: NEGATIVE
RBC, UA: NEGATIVE
Specific Gravity, UA: 1.02 (ref 1.005–1.030)
Urobilinogen, Ur: 0.2 mg/dL (ref 0.2–1.0)
pH, UA: 6.5 (ref 5.0–7.5)

## 2021-01-21 MED ORDER — TAMSULOSIN HCL 0.4 MG PO CAPS: 0.4000 mg | ORAL_CAPSULE | Freq: Every day | ORAL | 0 refills | Status: DC

## 2021-01-21 NOTE — Progress Notes (Signed)
01/21/2021 11:07 AM   Kayla Jimenez 16-Oct-1975 [id number]  CC: Chief Complaint  Patient presents with   Nephrolithiasis   HPI: Kayla Jimenez is a 45 y.o. female with PMH recurrent UTI and nephrolithiasis who presents today for evaluation of possible acute stone episode.   Today she reports a several day history of right flank pain radiating to the anterior abdomen associated with increased frequency.  She denies fever, chills, nausea, or vomiting.  She saw her PCP 4 days ago for evaluation of the same.  UA was rather bland and urine culture has not yet finalized.  A renal ultrasound was performed that day, which revealed no hydronephrosis and a 6 mm nonobstructing mid right renal calculus.  A 3 mm nonobstructing right renal stone was seen on CT stone study dated 07/09/2018.  In-office UA today pan negative; urine microscopy with >10 epithelial cells/hpf and moderate bacteria. PVR 142mL.  PMH: Past Medical History:  Diagnosis Date   Anxiety 08/29/10   Bursitis    left foot   BV (bacterial vaginosis) 8/78/67   Complication of anesthesia 2004   vomited   Gross hematuria 07/08/2015   H/O bladder infections    H/O mumps    H/O toxoplasmosis    H/O varicella    History of kidney stones    Infections of kidney    several kidney stones, bladder infections   Kidney disease 09/03/2011   Left ureteral calculus with partial obstruction, passed 06/09/2014. CT did show perinephric stranding.    Menometrorrhagia 06/26/10   Plantar fasciitis    PMS (premenstrual syndrome) 07/10/11   Post - coital bleeding 08/29/10   Renal calculus, left    Sinus infection    UTI (lower urinary tract infection) 07/08/2015   Yeast infection     Surgical History: Past Surgical History:  Procedure Laterality Date   ABDOMINAL HYSTERECTOMY     ANTERIOR AND POSTERIOR REPAIR  03/16/2012   Procedure: ANTERIOR (CYSTOCELE) AND POSTERIOR REPAIR (RECTOCELE);  Surgeon: [name], MD;  Location: [facility];   Service: Gynecology;  Laterality: N/A;  anterior repair/cysto   BLADDER SUSPENSION  03/16/2012   Procedure: TRANSVAGINAL TAPE (TVT) PROCEDURE;  Surgeon: [name], MD;  Location: [facility];  Service: Gynecology;  Laterality: N/A;   BREAST REDUCTION SURGERY  6/04   CHOLECYSTECTOMY  06/28/14   CYSTOSCOPY  03/16/2012   Procedure: CYSTOSCOPY;  Surgeon: [name], MD;  Location: [facility];  Service: Gynecology;  Laterality: N/A;   CYSTOSCOPY  03/16/2017   Procedure: CYSTOSCOPY;  Surgeon: Everett Graff, MD;  Location: [facility];  Service: Gynecology;;   [place]  03/16/2012   Procedure: DILATATION & CURETTAGE/HYSTEROSCOPY WITH NOVASURE ABLATION;  Surgeon: [name], MD;  Location: [facility];  Service: Gynecology;  Laterality: N/A;   TONSILLECTOMY AND ADENOIDECTOMY     VAGINAL HYSTERECTOMY Bilateral 03/16/2017   Procedure: HYSTERECTOMY VAGINAL Bilateral Salpingectomy ;  Surgeon: Everett Graff, MD;  Location: [facility];  Service: Gynecology;  Laterality: Bilateral;    Home Medications:  Allergies as of 01/21/2021       Reactions   Percocet [oxycodone-acetaminophen] Itching   Sulfa Antibiotics Itching   Sulfonamide Derivatives    REACTION: Eyes Itch        Medication List        Accurate as of January 21, 2021 11:07 AM. If you have any questions, ask your nurse or doctor.          buPROPion  ER 100 MG 12 hr tablet Commonly known as: WELLBUTRIN SR Take 1 tablet (100 mg total) by mouth daily.   Cranberry Concentrate/VitaminC 15000-100 MG Caps Generic drug: Cranberry-Vitamin C Take by mouth.   cyanocobalamin 1000 MCG/ML injection Commonly known as: (VITAMIN B-12) once aweek for 3 weeks than once a month   eszopiclone 2 MG Tabs tablet Commonly known as: LUNESTA Take 1 tablet (2 mg total) by mouth at bedtime as needed for sleep. Take immediately before bedtime   INSULIN SYRINGE 1CC/29G 29G X 1/2" 1 ML Misc Commonly  known as: Safety Insulin Syringes Use for b12 injection   montelukast 10 MG tablet Commonly known as: SINGULAIR TAKE 1 TABLET BY MOUTH EVERYDAY AT BEDTIME   phenazopyridine 100 MG tablet Commonly known as: Pyridium Take 1 tablet (100 mg total) by mouth 3 (three) times daily as needed for pain.   Rybelsus 3 MG Tabs Generic drug: Semaglutide Take 3 mg by mouth daily. On an empty stomach with no more than 4 oz of water, may eat, drink and take other medications 30 minutes later   sertraline 50 MG tablet Commonly known as: ZOLOFT Take 1.5 tablets (75 mg total) by mouth at bedtime.   traMADol 50 MG tablet Commonly known as: ULTRAM Take 1 tablet (50 mg total) by mouth every 8 (eight) hours as needed for up to 7 days.   Vitamin D (Ergocalciferol) 1.25 MG (50000 UNIT) Caps capsule Commonly known as: DRISDOL TAKE 1 CAPSULE BY MOUTH ONE TIME PER WEEK        Allergies:  Allergies  Allergen Reactions   Percocet [Oxycodone-Acetaminophen] Itching   Sulfa Antibiotics Itching   Sulfonamide Derivatives     REACTION: Eyes Itch    Family History: Family History  Problem Relation Age of Onset   Cancer Other        breast   Cancer Mother        cervical   Depression Mother    Drug abuse Mother    Anemia Mother        blood transfusion   Migraines Mother     Social History:   reports that she has never smoked. She has never used smokeless tobacco. She reports that she does not drink alcohol and does not use drugs.  Physical Exam: BP (!) 156/92   Pulse 93   Ht 5\' 3"  (1.6 m)   Wt 157 lb (71.2 kg)   BMI 27.81 kg/m   Constitutional:  Alert and oriented, no acute distress, nontoxic appearing HEENT: [place], AT Cardiovascular: No clubbing, cyanosis, or edema Respiratory: Normal respiratory effort, no increased work of breathing Skin: No rashes, bruises or suspicious lesions Neurologic: Grossly intact, no focal deficits, moving all 4 extremities Psychiatric: Normal mood and  affect  Laboratory Data: Results for orders placed or performed in visit on 01/21/21  Mycoplasma / ureaplasma culture   Specimen: Genital   UR  Result Value Ref Range   Ureaplasma urealyticum Comment Negative   Mycoplasma hominis Culture Comment Negative  CULTURE, URINE COMPREHENSIVE   Specimen: Urine   UR  Result Value Ref Range   Urine Culture, Comprehensive Preliminary report    Organism ID, Bacteria Comment   Microscopic Examination   Urine  Result Value Ref Range   WBC, UA 0-5 0 - 5 /hpf   RBC None seen 0 - 2 /hpf   Epithelial Cells (non renal) >10 (H) 0 - 10 /hpf   Bacteria, UA Moderate (A) None seen/Few  Urinalysis, Complete  Result Value Ref Range   Specific Gravity, UA 1.020 1.005 - 1.030   pH, UA 6.5 5.0 - 7.5   Color, UA Yellow Yellow   Appearance Ur Clear Clear   Leukocytes,UA Negative Negative   Protein,UA Negative Negative/Trace   Glucose, UA Negative Negative   Ketones, UA Negative Negative   RBC, UA Negative Negative   Bilirubin, UA Negative Negative   Urobilinogen, Ur 0.2 0.2 - 1.0 mg/dL   Nitrite, UA Negative Negative   Microscopic Examination See below:   Specimen status report  Result Value Ref Range   specimen status report Comment   Bladder Scan (Post Void Residual) in office  Result Value Ref Range   Scan Result 115mL    Pertinent Imaging: Results for orders placed during the hospital encounter of 01/17/21  US Renal  Narrative CLINICAL DATA:  Right flank pain, lower abdominal pain  EXAM: RENAL / URINARY TRACT ULTRASOUND COMPLETE  COMPARISON:  04/02/2020  FINDINGS: Right Kidney:  Renal measurements: 10.6 x 3.9 x 3.9 cm = volume: 85 mL. Normal echotexture. No hydronephrosis. 6 mm nonobstructing calculus mid right kidney.  Left Kidney:  Renal measurements: 11.2 x 4.9 x 5.4 cm = volume: 154 mL. Echogenicity within normal limits. No mass or hydronephrosis visualized.  Bladder:  Appears normal for degree of bladder  distention.  Other:  None.  IMPRESSION: 1. Nonobstructing 6 mm right renal calculus. 2. Otherwise unremarkable renal ultrasound.   Electronically Signed By: Randa Ngo M.D. On: 01/17/2021 17:22  I personally reviewed the images referenced above and note a stable nonobstructing right renal stone without urolithiasis.  Bilateral ureteral jets are seen.  Assessment & Plan:   1. Right renal stone Nonobstructing, stable right renal stone seen on renal ultrasound.  No evidence of urinary obstruction at this time and her UA remains bland, though it is contaminated.  I think urinary contamination is the source of her bacteriuria and I do not think she is clinically infected today.  We discussed that ultrasound can overestimate stone size and I do not think this is truly a 6 mm stone, rather more likely a 3 to 4 mm stone stable compared to prior.  Overall, low suspicion for acute stone episode at this time.  We discussed that musculoskeletal back pain can mimic renal colic.  Patient is concerned that she may have a urinary infection, so I will send for standard and atypical culture today, though I explained that in the absence of pyuria this is extremely unlikely. - Urinalysis, Complete - Mycoplasma / ureaplasma culture - CULTURE, URINE COMPREHENSIVE  2. Incomplete bladder emptying No prior PVR per chart review.  She does have a slightly elevated residual today which may account for her recently increased frequency.  We will start Flomax and plan for symptom recheck and PVR with Dr. Matilde Sprang in 4 weeks.  May consider urodynamics in the future for possible voiding dysfunction. - Bladder Scan (Post Void Residual) in office - tamsulosin (FLOMAX) 0.4 MG CAPS capsule; Take 1 capsule (0.4 mg total) by mouth daily.  Dispense: 30 capsule; Refill: 0   Return in about 4 weeks (around 02/18/2021) for Symptom recheck with PVR with Dr. Matilde Sprang.  Debroah Loop, PA-C  [facility] [address], [place], [place] [address] [phone]

## 2021-01-24 LAB — CULTURE, URINE COMPREHENSIVE

## 2021-01-25 ENCOUNTER — Other Ambulatory Visit: Payer: Self-pay | Admitting: Internal Medicine

## 2021-01-28 LAB — SPECIMEN STATUS REPORT

## 2021-01-28 LAB — CULTURE, URINE COMPREHENSIVE

## 2021-01-28 LAB — MYCOPLASMA / UREAPLASMA CULTURE
Mycoplasma hominis Culture: NEGATIVE
Ureaplasma urealyticum: NEGATIVE

## 2021-01-29 ENCOUNTER — Telehealth: Payer: Self-pay

## 2021-01-29 NOTE — Telephone Encounter (Signed)
-----   Message from Debroah Loop, [place] sent at 01/29/2021  3:53 PM EDT ----- Urine cultures negative for infection. No further intervention indicated. ----- Message ----- From: Lavone Neri Lab Results In Sent: 01/21/2021   4:36 PM EDT To: Debroah Loop, PA-C

## 2021-01-29 NOTE — Telephone Encounter (Signed)
OK per DPR, [facility] notifying patient.

## 2021-02-05 NOTE — Telephone Encounter (Signed)
Med sent to pharmacy.

## 2021-02-06 ENCOUNTER — Ambulatory Visit: Payer: BC Managed Care – PPO | Admitting: Nurse Practitioner

## 2021-02-06 ENCOUNTER — Encounter: Payer: Self-pay | Admitting: Nurse Practitioner

## 2021-02-06 ENCOUNTER — Ambulatory Visit (INDEPENDENT_AMBULATORY_CARE_PROVIDER_SITE_OTHER): Payer: BC Managed Care – PPO | Admitting: Nurse Practitioner

## 2021-02-06 ENCOUNTER — Other Ambulatory Visit: Payer: Self-pay | Admitting: Nurse Practitioner

## 2021-02-06 ENCOUNTER — Other Ambulatory Visit: Payer: Self-pay

## 2021-02-06 VITALS — BP 140/85 | HR 77 | Temp 98.4°F | Resp 16 | Ht 63.0 in | Wt 158.2 lb

## 2021-02-06 DIAGNOSIS — Z6828 Body mass index (BMI) 28.0-28.9, adult: Secondary | ICD-10-CM | POA: Diagnosis not present

## 2021-02-06 DIAGNOSIS — E663 Overweight: Secondary | ICD-10-CM

## 2021-02-06 DIAGNOSIS — L723 Sebaceous cyst: Secondary | ICD-10-CM

## 2021-02-06 DIAGNOSIS — Z23 Encounter for immunization: Secondary | ICD-10-CM | POA: Diagnosis not present

## 2021-02-06 DIAGNOSIS — F321 Major depressive disorder, single episode, moderate: Secondary | ICD-10-CM

## 2021-02-06 MED ORDER — TETANUS-DIPHTH-ACELL PERTUSSIS 5-2.5-18.5 LF-MCG/0.5 IM SUSP: 0.5000 mL | Freq: Once | INTRAMUSCULAR | 0 refills | Status: AC

## 2021-02-06 NOTE — Progress Notes (Signed)
[facility] [place], [place] [address]  Internal MEDICINE  Office Visit Note  Patient Name: Kayla Jimenez  [id number]  [id number]  Date of Service: 02/06/2021  Chief Complaint  Patient presents with   Follow-up    Lump under left arm, noticed it about 3 weeks ago, size of lump has not changed, no oozing, not able to be popped     HPI Karma presents for a follow up for weight loss management. She also has a cyst-like lump on her left axilla. She noticed this about 3 weeks ago but it has not resolved. It has not changed in size, but is unable to be popped, and there is no oozing or drainage.  She is interested in doing the metabolic test. And possibly getting help with weight loss.     Current Medication: Outpatient Encounter Medications as of 02/06/2021  Medication Sig   Cranberry-Vitamin C (CRANBERRY CONCENTRATE/VITAMINC) 15000-100 MG CAPS Take by mouth.   cyanocobalamin (,VITAMIN B-12,) 1000 MCG/ML injection once aweek for 3 weeks than once a month   eszopiclone (LUNESTA) 2 MG TABS tablet Take 1 tablet (2 mg total) by mouth at bedtime as needed for sleep. Take immediately before bedtime   INSULIN SYRINGE 1CC/29G (SAFETY INSULIN SYRINGES) 29G X 1/2" 1 ML MISC Use for b12 injection   montelukast (SINGULAIR) 10 MG tablet TAKE 1 TABLET BY MOUTH EVERYDAY AT BEDTIME   Semaglutide (RYBELSUS) 3 MG TABS Take 3 mg by mouth daily. On an empty stomach with no more than 4 oz of water, may eat, drink and take other medications 30 minutes later   sertraline (ZOLOFT) 50 MG tablet Take 1.5 tablets (75 mg total) by mouth at bedtime.   Vitamin D, Ergocalciferol, (DRISDOL) 1.25 MG (50000 UNIT) CAPS capsule TAKE 1 CAPSULE BY MOUTH ONE TIME PER WEEK   [DISCONTINUED] buPROPion ER (WELLBUTRIN SR) 100 MG 12 hr tablet Take 1 tablet (100 mg total) by mouth daily.   [DISCONTINUED] tamsulosin (FLOMAX) 0.4 MG CAPS capsule Take 1 capsule (0.4 mg total) by mouth daily.   [DISCONTINUED]  Tdap (BOOSTRIX) 5-2.5-18.5 LF-MCG/0.5 injection Inject 0.5 mLs into the muscle once.   [EXPIRED] Tdap (BOOSTRIX) 5-2.5-18.5 LF-MCG/0.5 injection Inject 0.5 mLs into the muscle once for 1 dose.   [DISCONTINUED] phenazopyridine (PYRIDIUM) 100 MG tablet Take 1 tablet (100 mg total) by mouth 3 (three) times daily as needed for pain. (Patient not taking: No sig reported)   No facility-administered encounter medications on file as of 02/06/2021.    Surgical History: Past Surgical History:  Procedure Laterality Date   ABDOMINAL HYSTERECTOMY     ANTERIOR AND POSTERIOR REPAIR  03/16/2012   Procedure: ANTERIOR (CYSTOCELE) AND POSTERIOR REPAIR (RECTOCELE);  Surgeon: [name], MD;  Location: [facility];  Service: Gynecology;  Laterality: N/A;  anterior repair/cysto   BLADDER SUSPENSION  03/16/2012   Procedure: TRANSVAGINAL TAPE (TVT) PROCEDURE;  Surgeon: [name], MD;  Location: [facility];  Service: Gynecology;  Laterality: N/A;   BREAST REDUCTION SURGERY  6/04   CHOLECYSTECTOMY  06/28/14   CYSTOSCOPY  03/16/2012   Procedure: CYSTOSCOPY;  Surgeon: [name], MD;  Location: [facility];  Service: Gynecology;  Laterality: N/A;   CYSTOSCOPY  03/16/2017   Procedure: CYSTOSCOPY;  Surgeon: Everett Graff, MD;  Location: [facility];  Service: Gynecology;;   [place]  03/16/2012   Procedure: DILATATION & CURETTAGE/HYSTEROSCOPY WITH NOVASURE ABLATION;  Surgeon: [name], MD;  Location: [facility];  Service: Gynecology;  Laterality: N/A;   TONSILLECTOMY AND ADENOIDECTOMY     VAGINAL HYSTERECTOMY Bilateral 03/16/2017   Procedure: HYSTERECTOMY VAGINAL Bilateral Salpingectomy ;  Surgeon: Everett Graff, MD;  Location: [facility];  Service: Gynecology;  Laterality: Bilateral;    Medical History: Past Medical History:  Diagnosis Date   Anxiety 08/29/10   Bursitis    left foot   BV (bacterial vaginosis) 6/43/32   Complication of anesthesia 2004    vomited   Gross hematuria 07/08/2015   H/O bladder infections    H/O mumps    H/O toxoplasmosis    H/O varicella    History of kidney stones    Infections of kidney    several kidney stones, bladder infections   Kidney disease 09/03/2011   Left ureteral calculus with partial obstruction, passed 06/09/2014. CT did show perinephric stranding.    Menometrorrhagia 06/26/10   Plantar fasciitis    PMS (premenstrual syndrome) 07/10/11   Post - coital bleeding 08/29/10   Renal calculus, left    Sinus infection    UTI (lower urinary tract infection) 07/08/2015   Yeast infection     Family History: Family History  Problem Relation Age of Onset   Cancer Other        breast   Cancer Mother        cervical   Depression Mother    Drug abuse Mother    Anemia Mother        blood transfusion   Migraines Mother     Social History   Socioeconomic History   Marital status: Married    Spouse name: Not on file   Number of children: Not on file   Years of education: Not on file   Highest education level: Not on file  Occupational History   Not on file  Tobacco Use   Smoking status: Never   Smokeless tobacco: Never  Vaping Use   Vaping Use: Never used  Substance and Sexual Activity   Alcohol use: No   Drug use: No   Sexual activity: Yes    Birth control/protection: Surgical    Comment: pt spouse had vasec.   Other Topics Concern   Not on file  Social History Narrative   Not on file   Social Determinants of Health   Financial Resource Strain: Not on file  Food Insecurity: Not on file  Transportation Needs: Not on file  Physical Activity: Not on file  Stress: Not on file  Social Connections: Not on file  Intimate Partner Violence: Not on file      Review of Systems  Constitutional:  Negative for chills, fatigue and unexpected weight change.  HENT:  Negative for congestion, rhinorrhea, sneezing and sore throat.   Eyes:  Negative for redness.  Respiratory:  Negative for  cough, chest tightness and shortness of breath.   Cardiovascular:  Negative for chest pain and palpitations.  Gastrointestinal:  Negative for abdominal pain, constipation, diarrhea, nausea and vomiting.  Genitourinary:  Negative for dysuria and frequency.  Musculoskeletal:  Negative for arthralgias, back pain, joint swelling and neck pain.  Skin:  Negative for rash.       Bump in left axilla, no broken skin, no draining or oozing per patient.  Neurological: Negative.  Negative for tremors and numbness.  Hematological:  Negative for adenopathy. Does not bruise/bleed easily.  Psychiatric/Behavioral:  Negative for behavioral problems (Depression), sleep disturbance and suicidal ideas. The patient is not nervous/anxious.    Vital Signs: BP Marland Kitchen)  146/85   Pulse 77   Temp 98.4 F (36.9 C)   Resp 16   Ht 5\' 3"  (1.6 m)   Wt 158 lb 3.2 oz (71.8 kg)   SpO2 98%   BMI 28.02 kg/m    Physical Exam Vitals reviewed.  Constitutional:      General: She is not in acute distress.    Appearance: Normal appearance. She is normal weight. She is not ill-appearing.  HENT:     Head: Normocephalic and atraumatic.  Eyes:     Pupils: Pupils are equal, round, and reactive to light.  Cardiovascular:     Rate and Rhythm: Normal rate and regular rhythm.  Pulmonary:     Effort: Pulmonary effort is normal. No respiratory distress.  Skin:    Comments: Left axilla superficial bump, slightly tender, no broken skin, no oozing or drainage noted.  Neurological:     Mental Status: She is alert and oriented to person, place, and time.     Cranial Nerves: No cranial nerve deficit.     Coordination: Coordination normal.     Gait: Gait normal.  Psychiatric:        Mood and Affect: Mood normal.        Behavior: Behavior normal.       Assessment/Plan: 1. Sebaceous cyst of left axilla Referred to dermatology. Also instructed patient to apply a warm compress to relieve discomfort and to help decrease swelling.  -  Ambulatory referral to Dermatology  2. Needs flu shot Administered in office today.  - Flu Vaccine MDCK QUAD PF  3. Overweight with body mass index (BMI) of 28 to 64.3 in adult Metabolic test done, patient provided with sample meal plans according to recommended daily calorie intake.  - Metabolic Test   General Counseling: Gwendoline verbalizes understanding of the findings of todays visit and agrees with plan of treatment. I have discussed any further diagnostic evaluation that may be needed or ordered today. We also reviewed her medications today. she has been encouraged to call the office with any questions or concerns that should arise related to todays visit.    Orders Placed This Encounter  Procedures   Metabolic Test   Flu Vaccine MDCK QUAD PF   Ambulatory referral to Dermatology    Meds ordered this encounter  Medications   Tdap (BOOSTRIX) 5-2.5-18.5 LF-MCG/0.5 injection    Sig: Inject 0.5 mLs into the muscle once for 1 dose.    Dispense:  0.5 mL    Refill:  0    Return in about 1 month (around 03/08/2021) for F/U, Weight loss, Kenta Laster PCP.   Total time spent:30 Minutes Time spent includes review of chart, medications, test results, and follow up plan with the patient.   [place] Controlled Substance Database was reviewed by me.  This patient was seen by Jonetta Osgood, FNP-C in collaboration with Dr. Clayborn Bigness as a part of collaborative care agreement.   Nivea Wojdyla R. Valetta Fuller, MSN, FNP-C Internal medicine

## 2021-02-08 ENCOUNTER — Telehealth: Payer: Self-pay

## 2021-02-08 NOTE — Telephone Encounter (Signed)
Dermatology referral sent to Dr. Karle Barr via [facility]

## 2021-02-12 ENCOUNTER — Other Ambulatory Visit: Payer: Self-pay | Admitting: Physician Assistant

## 2021-02-12 DIAGNOSIS — R339 Retention of urine, unspecified: Secondary | ICD-10-CM

## 2021-02-18 ENCOUNTER — Ambulatory Visit: Payer: BC Managed Care – PPO | Admitting: Urology

## 2021-02-18 ENCOUNTER — Encounter: Payer: Self-pay | Admitting: Urology

## 2021-02-18 ENCOUNTER — Other Ambulatory Visit: Payer: Self-pay

## 2021-02-18 VITALS — BP 156/100 | HR 98 | Ht 63.0 in | Wt 158.0 lb

## 2021-02-18 DIAGNOSIS — R339 Retention of urine, unspecified: Secondary | ICD-10-CM | POA: Diagnosis not present

## 2021-02-18 LAB — BLADDER SCAN AMB NON-IMAGING: Scan Result: 67

## 2021-02-18 NOTE — Progress Notes (Signed)
02/18/2021 3:28 PM   Kayla Jimenez 07-17-1975 [id number]  Referring provider: Lavera Guise, [place],  [place] [address]  No chief complaint on file.   HPI: Dr Bethann Goo: Recurrent urinary tract infections and a previous sling.  Has stress incontinence.  Recent stone with 6 mm stone right kidney nonobstructing.  Was given Flomax for mildly elevated postvoid residual of 134 mL.   Patient still has right-sided pain.  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  She is continent.  Flow was reasonable.  CT stone protocol ordered   PMH: Past Medical History:  Diagnosis Date   Anxiety 08/29/10   Bursitis    left foot   BV (bacterial vaginosis) 6/44/03   Complication of anesthesia 2004   vomited   Gross hematuria 07/08/2015   H/O bladder infections    H/O mumps    H/O toxoplasmosis    H/O varicella    History of kidney stones    Infections of kidney    several kidney stones, bladder infections   Kidney disease 09/03/2011   Left ureteral calculus with partial obstruction, passed 06/09/2014. CT did show perinephric stranding.    Menometrorrhagia 06/26/10   Plantar fasciitis    PMS (premenstrual syndrome) 07/10/11   Post - coital bleeding 08/29/10   Renal calculus, left    Sinus infection    UTI (lower urinary tract infection) 07/08/2015   Yeast infection     Surgical History: Past Surgical History:  Procedure Laterality Date   ABDOMINAL HYSTERECTOMY     ANTERIOR AND POSTERIOR REPAIR  03/16/2012   Procedure: ANTERIOR (CYSTOCELE) AND POSTERIOR REPAIR (RECTOCELE);  Surgeon: [name], MD;  Location: [facility];  Service: Gynecology;  Laterality: N/A;  anterior repair/cysto   BLADDER SUSPENSION  03/16/2012   Procedure: TRANSVAGINAL TAPE (TVT) PROCEDURE;  Surgeon: [name], MD;  Location: [facility];  Service: Gynecology;  Laterality: N/A;   BREAST REDUCTION SURGERY  6/04   CHOLECYSTECTOMY  06/28/14   CYSTOSCOPY  03/16/2012   Procedure: CYSTOSCOPY;  Surgeon: [name], MD;  Location: [facility];  Service: Gynecology;  Laterality: N/A;   CYSTOSCOPY  03/16/2017   Procedure: CYSTOSCOPY;  Surgeon: Everett Graff, MD;  Location: [facility];  Service: Gynecology;;   [place]  03/16/2012   Procedure: DILATATION & CURETTAGE/HYSTEROSCOPY WITH NOVASURE ABLATION;  Surgeon: [name], MD;  Location: [facility];  Service: Gynecology;  Laterality: N/A;   TONSILLECTOMY AND ADENOIDECTOMY     VAGINAL HYSTERECTOMY Bilateral 03/16/2017   Procedure: HYSTERECTOMY VAGINAL Bilateral Salpingectomy ;  Surgeon: Everett Graff, MD;  Location: [facility];  Service: Gynecology;  Laterality: Bilateral;    Home Medications:  Allergies as of 02/18/2021       Reactions   Percocet [oxycodone-acetaminophen] Itching   Sulfa Antibiotics Itching   Sulfonamide Derivatives    REACTION: Eyes Itch        Medication List        Accurate as of February 18, 2021  3:28 PM. If you have any questions, ask your nurse or doctor.          buPROPion ER 100 MG 12 hr tablet Commonly known as: WELLBUTRIN SR TAKE 1 TABLET BY MOUTH EVERY DAY   Cranberry Concentrate/VitaminC 15000-100 MG Caps Generic drug: Cranberry-Vitamin C Take by mouth.   cyanocobalamin 1000 MCG/ML injection Commonly known as: (VITAMIN B-12) once aweek for 3 weeks than once a month   eszopiclone 2 MG  Tabs tablet Commonly known as: LUNESTA Take 1 tablet (2 mg total) by mouth at bedtime as needed for sleep. Take immediately before bedtime   INSULIN SYRINGE 1CC/29G 29G X 1/2" 1 ML Misc Commonly known as: Safety Insulin Syringes Use for b12 injection   montelukast 10 MG tablet Commonly known as: SINGULAIR TAKE 1 TABLET BY MOUTH EVERYDAY AT BEDTIME   Rybelsus 3 MG Tabs Generic drug: Semaglutide Take 3 mg by mouth daily. On an empty stomach with no more than 4 oz of water, may eat, drink and take other medications 30 minutes later   sertraline 50 MG  tablet Commonly known as: ZOLOFT Take 1.5 tablets (75 mg total) by mouth at bedtime.   tamsulosin 0.4 MG Caps capsule Commonly known as: FLOMAX Take 1 capsule (0.4 mg total) by mouth daily.   Vitamin D (Ergocalciferol) 1.25 MG (50000 UNIT) Caps capsule Commonly known as: DRISDOL TAKE 1 CAPSULE BY MOUTH ONE TIME PER WEEK        Allergies:  Allergies  Allergen Reactions   Percocet [Oxycodone-Acetaminophen] Itching   Sulfa Antibiotics Itching   Sulfonamide Derivatives     REACTION: Eyes Itch    Family History: Family History  Problem Relation Age of Onset   Cancer Other        breast   Cancer Mother        cervical   Depression Mother    Drug abuse Mother    Anemia Mother        blood transfusion   Migraines Mother     Social History:  reports that she has never smoked. She has never used smokeless tobacco. She reports that she does not drink alcohol and does not use drugs.  ROS:                                        Physical Exam: There were no vitals taken for this visit.  Constitutional:  Alert and oriented, No acute distress. HEENT: [place] AT, moist mucus membranes.  Trachea midline, no masses. Cardiovascular: No clubbing, cyanosis, or edema. Respiratory: Normal respiratory effort, no increased   Laboratory Data: Lab Results  Component Value Date   WBC 10.2 11/08/2020   HGB 14.3 11/08/2020   HCT 41.3 11/08/2020   MCV 94 11/08/2020   PLT 332 11/08/2020    Lab Results  Component Value Date   CREATININE 0.67 11/08/2020    No results found for: PSA  No results found for: TESTOSTERONE  Lab Results  Component Value Date   HGBA1C 5.2 11/28/2020    Urinalysis    Component Value Date/Time   COLORURINE STRAW (A) 03/17/2017 1000   APPEARANCEUR Clear 01/21/2021 1059   LABSPEC 1.006 03/17/2017 1000   PHURINE 7.0 03/17/2017 1000   GLUCOSEU Negative 01/21/2021 1059   HGBUR SMALL (A) 03/17/2017 1000   BILIRUBINUR Negative  01/21/2021 1059   KETONESUR NEGATIVE 03/17/2017 1000   PROTEINUR Negative 01/21/2021 1059   PROTEINUR NEGATIVE 03/17/2017 1000   UROBILINOGEN 2.0 (A) 01/17/2021 1113   NITRITE Negative 01/21/2021 1059   NITRITE NEGATIVE 03/17/2017 1000   LEUKOCYTESUR Negative 01/21/2021 1059    Pertinent Imaging:   Assessment & Plan:  Rt renal stone - not related to rt sided pain No ct ordered Minimal voiding dysfunction  There are no diagnoses linked to this encounter.  No follow-ups on file.  Reece Packer, MD  [facility] [address] [place], [place] [address] [phone]

## 2021-03-07 ENCOUNTER — Encounter: Payer: Self-pay | Admitting: Nurse Practitioner

## 2021-04-05 ENCOUNTER — Encounter: Payer: Self-pay | Admitting: Physician Assistant

## 2021-04-05 ENCOUNTER — Ambulatory Visit (INDEPENDENT_AMBULATORY_CARE_PROVIDER_SITE_OTHER): Payer: BC Managed Care – PPO | Admitting: Physician Assistant

## 2021-04-05 ENCOUNTER — Other Ambulatory Visit: Payer: Self-pay

## 2021-04-05 VITALS — BP 140/80 | HR 74 | Temp 97.8°F | Resp 16 | Ht 63.0 in | Wt 158.0 lb

## 2021-04-05 DIAGNOSIS — J0100 Acute maxillary sinusitis, unspecified: Secondary | ICD-10-CM

## 2021-04-05 MED ORDER — AMOXICILLIN-POT CLAVULANATE 875-125 MG PO TABS: 1.0000 | ORAL_TABLET | Freq: Two times a day (BID) | ORAL | 0 refills | Status: DC

## 2021-04-05 NOTE — Progress Notes (Signed)
[facility] [place], [place] [address]  Internal MEDICINE  Office Visit Note  Patient Name: Kayla Jimenez  [id number]  [id number]  Date of Service: 04/05/2021  Chief Complaint  Patient presents with   Acute Visit    Covid negative   Sinusitis   Ear Pain    Right      HPI Pt is here for a sick visit. -Has had sinus congestion, right ear pain, and sinus headaches for 2weeks. -Denies any cough, SOB, wheezing -Tried pseudoephedrine and ibuprofen without any relief and continues her allergy pill and nasal spray -Took a covid test on christmas Day which was negative  Current Medication:  Outpatient Encounter Medications as of 04/05/2021  Medication Sig   amoxicillin-clavulanate (AUGMENTIN) 875-125 MG tablet Take 1 tablet by mouth 2 (two) times daily. Take with food.   buPROPion ER (WELLBUTRIN SR) 100 MG 12 hr tablet TAKE 1 TABLET BY MOUTH EVERY DAY   CLENPIQ 10-3.5-12 MG-GM -GM/160ML SOLN Take by mouth.   Cranberry-Vitamin C (CRANBERRY CONCENTRATE/VITAMINC) 15000-100 MG CAPS Take by mouth.   cyanocobalamin (,VITAMIN B-12,) 1000 MCG/ML injection once aweek for 3 weeks than once a month   eszopiclone (LUNESTA) 2 MG TABS tablet Take 1 tablet (2 mg total) by mouth at bedtime as needed for sleep. Take immediately before bedtime   INSULIN SYRINGE 1CC/29G (SAFETY INSULIN SYRINGES) 29G X 1/2" 1 ML MISC Use for b12 injection   montelukast (SINGULAIR) 10 MG tablet TAKE 1 TABLET BY MOUTH EVERYDAY AT BEDTIME   Semaglutide (RYBELSUS) 3 MG TABS Take 3 mg by mouth daily. On an empty stomach with no more than 4 oz of water, may eat, drink and take other medications 30 minutes later   sertraline (ZOLOFT) 50 MG tablet Take 1.5 tablets (75 mg total) by mouth at bedtime.   Vitamin D, Ergocalciferol, (DRISDOL) 1.25 MG (50000 UNIT) CAPS capsule TAKE 1 CAPSULE BY MOUTH ONE TIME PER WEEK   No facility-administered encounter medications on file as of 04/05/2021.       Medical History: Past Medical History:  Diagnosis Date   Anxiety 08/29/10   Bursitis    left foot   BV (bacterial vaginosis) 5/46/56   Complication of anesthesia 2004   vomited   Gross hematuria 07/08/2015   H/O bladder infections    H/O mumps    H/O toxoplasmosis    H/O varicella    History of kidney stones    Infections of kidney    several kidney stones, bladder infections   Kidney disease 09/03/2011   Left ureteral calculus with partial obstruction, passed 06/09/2014. CT did show perinephric stranding.    Menometrorrhagia 06/26/10   Plantar fasciitis    PMS (premenstrual syndrome) 07/10/11   Post - coital bleeding 08/29/10   Renal calculus, left    Sinus infection    UTI (lower urinary tract infection) 07/08/2015   Yeast infection      Vital Signs: BP 140/80    Pulse 74    Temp 97.8 F (36.6 C)    Resp 16    Ht 5\' 3"  (1.6 m)    Wt 158 lb (71.7 kg)    SpO2 98%    BMI 27.99 kg/m    Review of Systems  Constitutional:  Negative for fatigue and fever.  HENT:  Positive for congestion, ear pain, postnasal drip and sinus pressure. Negative for mouth sores.   Respiratory:  Negative for cough, shortness of breath and wheezing.   Cardiovascular:  Negative for chest pain.  Genitourinary:  Negative for flank pain.  Neurological:  Positive for headaches.  Psychiatric/Behavioral: Negative.     Physical Exam Vitals and nursing note reviewed.  Constitutional:      General: She is not in acute distress.    Appearance: She is well-developed. She is not diaphoretic.  HENT:     Head: Normocephalic and atraumatic.     Ears:     Comments: Erythema of right ear canal    Nose: Congestion present.     Mouth/Throat:     Pharynx: Posterior oropharyngeal erythema present. No oropharyngeal exudate.  Eyes:     Pupils: Pupils are equal, round, and reactive to light.  Neck:     Thyroid: No thyromegaly.     Vascular: No JVD.     Trachea: No tracheal deviation.  Cardiovascular:      Rate and Rhythm: Normal rate and regular rhythm.     Heart sounds: Normal heart sounds. No murmur heard.   No friction rub. No gallop.  Pulmonary:     Effort: Pulmonary effort is normal. No respiratory distress.     Breath sounds: No wheezing or rales.  Chest:     Chest wall: No tenderness.  Musculoskeletal:        General: Normal range of motion.     Cervical back: Normal range of motion and neck supple.  Lymphadenopathy:     Cervical: No cervical adenopathy.  Skin:    General: Skin is warm and dry.  Neurological:     Mental Status: She is alert and oriented to person, place, and time.     Cranial Nerves: No cranial nerve deficit.  Psychiatric:        Behavior: Behavior normal.        Thought Content: Thought content normal.        Judgment: Judgment normal.      Assessment/Plan: 1. Acute non-recurrent maxillary sinusitis Will start augmentin BID with food. May continue allergy med and nasal spray and may start on mucinex to help with congestion. Advised to rest and stay well hydrated. - amoxicillin-clavulanate (AUGMENTIN) 875-125 MG tablet; Take 1 tablet by mouth 2 (two) times daily. Take with food.  Dispense: 20 tablet; Refill: 0   General Counseling: Amarri verbalizes understanding of the findings of todays visit and agrees with plan of treatment. I have discussed any further diagnostic evaluation that may be needed or ordered today. We also reviewed her medications today. she has been encouraged to call the office with any questions or concerns that should arise related to todays visit.    Counseling:    No orders of the defined types were placed in this encounter.   Meds ordered this encounter  Medications   amoxicillin-clavulanate (AUGMENTIN) 875-125 MG tablet    Sig: Take 1 tablet by mouth 2 (two) times daily. Take with food.    Dispense:  20 tablet    Refill:  0    Time spent:25 Minutes

## 2021-04-10 ENCOUNTER — Encounter: Payer: BC Managed Care – PPO | Admitting: Nurse Practitioner

## 2021-04-19 ENCOUNTER — Telehealth: Payer: Self-pay

## 2021-04-19 NOTE — Telephone Encounter (Signed)
Left vm and sent mychart message to confirm 04/22/21 appointment-Toni

## 2021-04-22 ENCOUNTER — Encounter: Payer: Self-pay | Admitting: Nurse Practitioner

## 2021-04-22 ENCOUNTER — Other Ambulatory Visit: Payer: Self-pay

## 2021-04-22 ENCOUNTER — Ambulatory Visit (INDEPENDENT_AMBULATORY_CARE_PROVIDER_SITE_OTHER): Payer: BC Managed Care – PPO | Admitting: Nurse Practitioner

## 2021-04-22 VITALS — BP 140/88 | HR 90 | Temp 98.5°F | Resp 16 | Ht 63.0 in | Wt 160.4 lb

## 2021-04-22 DIAGNOSIS — E782 Mixed hyperlipidemia: Secondary | ICD-10-CM | POA: Diagnosis not present

## 2021-04-22 DIAGNOSIS — E663 Overweight: Secondary | ICD-10-CM

## 2021-04-22 DIAGNOSIS — Z0001 Encounter for general adult medical examination with abnormal findings: Secondary | ICD-10-CM | POA: Diagnosis not present

## 2021-04-22 DIAGNOSIS — G43009 Migraine without aura, not intractable, without status migrainosus: Secondary | ICD-10-CM

## 2021-04-22 DIAGNOSIS — F5101 Primary insomnia: Secondary | ICD-10-CM | POA: Diagnosis not present

## 2021-04-22 DIAGNOSIS — R300 Dysuria: Secondary | ICD-10-CM

## 2021-04-22 DIAGNOSIS — Z6828 Body mass index (BMI) 28.0-28.9, adult: Secondary | ICD-10-CM

## 2021-04-22 MED ORDER — ESZOPICLONE 2 MG PO TABS: 2.0000 mg | ORAL_TABLET | Freq: Every evening | ORAL | 2 refills | Status: DC | PRN

## 2021-04-22 MED ORDER — RYBELSUS 7 MG PO TABS
7.0000 mg | ORAL_TABLET | Freq: Every day | ORAL | 2 refills | Status: DC
Start: 2021-04-22 — End: 2021-05-27

## 2021-04-22 MED ORDER — BUTALBITAL-APAP-CAFFEINE 50-325-40 MG PO TABS: 1.0000 | ORAL_TABLET | Freq: Four times a day (QID) | ORAL | 0 refills | Status: DC | PRN

## 2021-04-22 NOTE — Progress Notes (Signed)
[facility] [place], [place] [address]  Internal MEDICINE  Office Visit Note  Patient Name: Kayla Jimenez  [id number]  [id number]  Date of Service: 04/22/2021  Chief Complaint  Patient presents with   Annual Exam   Anxiety   Weight Loss   Migraine    For the last week    HPI [name] presents for an annual well visit and physical exam. She is a well appearing 46 yo female with anxiety and impaired fasting glucose. She had her routine colonoscopy done in December and it was normal. She was seen by her OBGYN at [facility] in [place] for her pap smear and mammogram. Both were normal. She has been taking rybelsus 3 mg daily for weight loss and impaired fasting glucose but has noticed that she has been more hungry lately.  She has a migraine today that has been going on for 1 week. She does not typically have migraines. Her head feels tender and a pressure pain. No nausea or photosensitivity. She is currently taking bupropion and sertraline for anxiety.   Current Medication: Outpatient Encounter Medications as of 04/22/2021  Medication Sig   buPROPion ER (WELLBUTRIN SR) 100 MG 12 hr tablet TAKE 1 TABLET BY MOUTH EVERY DAY   Cranberry-Vitamin C (CRANBERRY CONCENTRATE/VITAMINC) 15000-100 MG CAPS Take by mouth.   cyanocobalamin (,VITAMIN B-12,) 1000 MCG/ML injection once aweek for 3 weeks than once a month   INSULIN SYRINGE 1CC/29G (SAFETY INSULIN SYRINGES) 29G X 1/2" 1 ML MISC Use for b12 injection   montelukast (SINGULAIR) 10 MG tablet TAKE 1 TABLET BY MOUTH EVERYDAY AT BEDTIME   Semaglutide (RYBELSUS) 7 MG TABS Take 7 mg by mouth daily.   sertraline (ZOLOFT) 50 MG tablet Take 1.5 tablets (75 mg total) by mouth at bedtime.   SUMAtriptan (IMITREX) 50 MG tablet Take 1 tablet (50 mg total) by mouth every 2 (two) hours as needed for migraine. May repeat in 2 hours if migraine persists or recurs.max 200 mg per 24 hours.   Vitamin D, Ergocalciferol,  (DRISDOL) 1.25 MG (50000 UNIT) CAPS capsule TAKE 1 CAPSULE BY MOUTH ONE TIME PER WEEK   [DISCONTINUED] amoxicillin-clavulanate (AUGMENTIN) 875-125 MG tablet Take 1 tablet by mouth 2 (two) times daily. Take with food.   [DISCONTINUED] butalbital-acetaminophen-caffeine (FIORICET) 50-325-40 MG tablet Take 1 tablet by mouth every 6 (six) hours as needed for headache or migraine. No more than 4 tablets per 24 hours.   [DISCONTINUED] CLENPIQ 10-3.5-12 MG-GM -GM/160ML SOLN Take by mouth.   [DISCONTINUED] eszopiclone (LUNESTA) 2 MG TABS tablet Take 1 tablet (2 mg total) by mouth at bedtime as needed for sleep. Take immediately before bedtime   [DISCONTINUED] Semaglutide (RYBELSUS) 3 MG TABS Take 3 mg by mouth daily. On an empty stomach with no more than 4 oz of water, may eat, drink and take other medications 30 minutes later   eszopiclone (LUNESTA) 2 MG TABS tablet Take 1 tablet (2 mg total) by mouth at bedtime as needed for sleep. Take immediately before bedtime   [EXPIRED] Tdap (BOOSTRIX) 5-2.5-18.5 LF-MCG/0.5 injection Inject 0.5 mLs into the muscle once for 1 dose.   [DISCONTINUED] buPROPion ER (WELLBUTRIN SR) 100 MG 12 hr tablet Take 1 tablet (100 mg total) by mouth daily.   [DISCONTINUED] tamsulosin (FLOMAX) 0.4 MG CAPS capsule Take 1 capsule (0.4 mg total) by mouth daily.   No facility-administered encounter medications on file as of 04/22/2021.    Surgical History: Past Surgical History:  Procedure Laterality  Date   ABDOMINAL HYSTERECTOMY     ANTERIOR AND POSTERIOR REPAIR  03/16/2012   Procedure: ANTERIOR (CYSTOCELE) AND POSTERIOR REPAIR (RECTOCELE);  Surgeon: [name], MD;  Location: [facility];  Service: Gynecology;  Laterality: N/A;  anterior repair/cysto   BLADDER SUSPENSION  03/16/2012   Procedure: TRANSVAGINAL TAPE (TVT) PROCEDURE;  Surgeon: [name], MD;  Location: [facility];  Service: Gynecology;  Laterality: N/A;   BREAST REDUCTION SURGERY  6/04   CHOLECYSTECTOMY  06/28/14    CYSTOSCOPY  03/16/2012   Procedure: CYSTOSCOPY;  Surgeon: [name], MD;  Location: [facility];  Service: Gynecology;  Laterality: N/A;   CYSTOSCOPY  03/16/2017   Procedure: CYSTOSCOPY;  Surgeon: Everett Graff, MD;  Location: [facility];  Service: Gynecology;;   [place]  03/16/2012   Procedure: DILATATION & CURETTAGE/HYSTEROSCOPY WITH NOVASURE ABLATION;  Surgeon: [name], MD;  Location: [facility];  Service: Gynecology;  Laterality: N/A;   TONSILLECTOMY AND ADENOIDECTOMY     VAGINAL HYSTERECTOMY Bilateral 03/16/2017   Procedure: HYSTERECTOMY VAGINAL Bilateral Salpingectomy ;  Surgeon: Everett Graff, MD;  Location: [facility];  Service: Gynecology;  Laterality: Bilateral;    Medical History: Past Medical History:  Diagnosis Date   Anxiety 08/29/10   Bursitis    left foot   BV (bacterial vaginosis) 6/56/81   Complication of anesthesia 2004   vomited   Gross hematuria 07/08/2015   H/O bladder infections    H/O mumps    H/O toxoplasmosis    H/O varicella    History of kidney stones    Infections of kidney    several kidney stones, bladder infections   Kidney disease 09/03/2011   Left ureteral calculus with partial obstruction, passed 06/09/2014. CT did show perinephric stranding.    Menometrorrhagia 06/26/10   Plantar fasciitis    PMS (premenstrual syndrome) 07/10/11   Post - coital bleeding 08/29/10   Renal calculus, left    Sinus infection    UTI (lower urinary tract infection) 07/08/2015   Yeast infection     Family History: Family History  Problem Relation Age of Onset   Cancer Other        breast   Cancer Mother        cervical   Depression Mother    Drug abuse Mother    Anemia Mother        blood transfusion   Migraines Mother     Social History   Socioeconomic History   Marital status: Married    Spouse name: Not on file   Number of children: Not on file   Years of education: Not on file   Highest education  level: Not on file  Occupational History   Not on file  Tobacco Use   Smoking status: Never   Smokeless tobacco: Never  Vaping Use   Vaping Use: Never used  Substance and Sexual Activity   Alcohol use: No   Drug use: No   Sexual activity: Yes    Birth control/protection: Surgical    Comment: pt spouse had vasec.   Other Topics Concern   Not on file  Social History Narrative   Not on file   Social Determinants of Health   Financial Resource Strain: Not on file  Food Insecurity: Not on file  Transportation Needs: Not on file  Physical Activity: Not on file  Stress: Not on file  Social Connections: Not on file  Intimate Partner Violence: Not on file  Review of Systems  Constitutional:  Negative for activity change, appetite change, chills, fatigue, fever and unexpected weight change.  HENT: Negative.  Negative for congestion, ear pain, rhinorrhea, sore throat and trouble swallowing.   Eyes: Negative.   Respiratory: Negative.  Negative for cough, chest tightness, shortness of breath and wheezing.   Cardiovascular: Negative.  Negative for chest pain.  Gastrointestinal: Negative.  Negative for abdominal pain, blood in stool, constipation, diarrhea, nausea and vomiting.  Endocrine: Negative.   Genitourinary: Negative.  Negative for difficulty urinating, dysuria, frequency, hematuria and urgency.  Musculoskeletal: Negative.  Negative for arthralgias, back pain, joint swelling, myalgias and neck pain.  Skin: Negative.  Negative for rash and wound.  Allergic/Immunologic: Negative.  Negative for immunocompromised state.  Neurological: Negative.  Negative for dizziness, seizures, numbness and headaches.  Hematological: Negative.   Psychiatric/Behavioral: Negative.  Negative for behavioral problems, self-injury and suicidal ideas. The patient is not nervous/anxious.    Vital Signs: BP 140/88    Pulse 90    Temp 98.5 F (36.9 C)    Resp 16    Ht 5\' 3"  (1.6 m)    Wt 160 lb 6.4  oz (72.8 kg)    SpO2 98%    BMI 28.41 kg/m    Physical Exam Vitals reviewed.  Constitutional:      General: She is awake. She is not in acute distress.    Appearance: Normal appearance. She is well-developed, well-groomed and overweight. She is not ill-appearing or diaphoretic.  HENT:     Head: Normocephalic and atraumatic.     Right Ear: Tympanic membrane, ear canal and external ear normal.     Left Ear: Tympanic membrane, ear canal and external ear normal.     Nose: Nose normal. No congestion or rhinorrhea.     Mouth/Throat:     Lips: Pink.     Mouth: Mucous membranes are moist.     Pharynx: Oropharynx is clear. Uvula midline. No oropharyngeal exudate or posterior oropharyngeal erythema.  Eyes:     General: Lids are normal. Vision grossly intact. Gaze aligned appropriately. No scleral icterus.       Right eye: No discharge.        Left eye: No discharge.     Extraocular Movements: Extraocular movements intact.     Conjunctiva/sclera: Conjunctivae normal.     Pupils: Pupils are equal, round, and reactive to light.     Funduscopic exam:    Right eye: Red reflex present.        Left eye: Red reflex present. Neck:     Thyroid: No thyromegaly.     Vascular: No carotid bruit or JVD.     Trachea: Trachea and phonation normal. No tracheal deviation.  Cardiovascular:     Rate and Rhythm: Normal rate and regular rhythm.     Pulses: Normal pulses.     Heart sounds: Normal heart sounds, S1 normal and S2 normal. No murmur heard.   No friction rub. No gallop.  Pulmonary:     Effort: Pulmonary effort is normal. No accessory muscle usage or respiratory distress.     Breath sounds: Normal breath sounds and air entry. No stridor. No wheezing or rales.  Chest:     Chest wall: No tenderness.     Comments: Declined clinical breast exam Abdominal:     General: Bowel sounds are normal. There is no distension.     Palpations: Abdomen is soft. There is no shifting dullness, fluid wave, mass or  pulsatile  mass.     Tenderness: There is no abdominal tenderness. There is no guarding or rebound.  Musculoskeletal:        General: No tenderness or deformity. Normal range of motion.     Cervical back: Normal range of motion and neck supple.     Right lower leg: No edema.     Left lower leg: No edema.  Lymphadenopathy:     Cervical: No cervical adenopathy.  Skin:    General: Skin is warm and dry.     Capillary Refill: Capillary refill takes less than 2 seconds.     Coloration: Skin is not pale.     Findings: No erythema or rash.  Neurological:     Mental Status: She is alert and oriented to person, place, and time.     Cranial Nerves: No cranial nerve deficit.     Motor: No abnormal muscle tone.     Coordination: Coordination normal.     Gait: Gait normal.     Deep Tendon Reflexes: Reflexes are normal and symmetric.  Psychiatric:        Mood and Affect: Mood normal.        Behavior: Behavior normal. Behavior is cooperative.        Thought Content: Thought content normal.        Judgment: Judgment normal.       Assessment/Plan: 1. Encounter for routine adult health examination with abnormal findings Age-appropriate preventive screenings and vaccinations discussed, annual physical exam completed. Routine labs for health maintenance ordered, see lipid panel order below. PHM updated.   2. Migraine without aura and without status migrainosus, not intractable Roselyn Meier given to patient in office today and her headache eased off some, sumatriptan prescribed to alleviate acute migraine. Fioricet did not help.  - SUMAtriptan (IMITREX) 50 MG tablet; Take 1 tablet (50 mg total) by mouth every 2 (two) hours as needed for migraine. May repeat in 2 hours if migraine persists or recurs.max 200 mg per 24 hours.  Dispense: 16 tablet; Refill: 0  3. Mixed hyperlipidemia Repeat lipid panel - Lipid Profile  4. Dysuria Routine urinalysis done - UA/M w/rflx Culture, Routine  5. Primary  insomnia Continue lunesta as prescribed, refills ordered - eszopiclone (LUNESTA) 2 MG TABS tablet; Take 1 tablet (2 mg total) by mouth at bedtime as needed for sleep. Take immediately before bedtime  Dispense: 30 tablet; Refill: 2  6. Overweight with body mass index (BMI) of 28 to 28.9 in adult Rybelsus dose increase to 7 mg daily, follow up in 1 month - Semaglutide (RYBELSUS) 7 MG TABS; Take 7 mg by mouth daily.  Dispense: 30 tablet; Refill: 2      General Counseling: Deneisha verbalizes understanding of the findings of todays visit and agrees with plan of treatment. I have discussed any further diagnostic evaluation that may be needed or ordered today. We also reviewed her medications today. she has been encouraged to call the office with any questions or concerns that should arise related to todays visit.    Orders Placed This Encounter  Procedures   UA/M w/rflx Culture, Routine   Lipid Profile    Meds ordered this encounter  Medications   Semaglutide (RYBELSUS) 7 MG TABS    Sig: Take 7 mg by mouth daily.    Dispense:  30 tablet    Refill:  2   eszopiclone (LUNESTA) 2 MG TABS tablet    Sig: Take 1 tablet (2 mg total) by mouth at bedtime as needed  for sleep. Take immediately before bedtime    Dispense:  30 tablet    Refill:  2   DISCONTD: butalbital-acetaminophen-caffeine (FIORICET) 50-325-40 MG tablet    Sig: Take 1 tablet by mouth every 6 (six) hours as needed for headache or migraine. No more than 4 tablets per 24 hours.    Dispense:  14 tablet    Refill:  0   SUMAtriptan (IMITREX) 50 MG tablet    Sig: Take 1 tablet (50 mg total) by mouth every 2 (two) hours as needed for migraine. May repeat in 2 hours if migraine persists or recurs.max 200 mg per 24 hours.    Dispense:  16 tablet    Refill:  0    Please fill now, discontinue fioricet, not helping. Please give patient 16 tablets    Return in about 1 month (around 05/23/2021) for F/U, Lindsee Labarre PCP increased rybelsus dose may  do virtual or in person.   Total time spent:30 Minutes Time spent includes review of chart, medications, test results, and follow up plan with the patient.   [place] Controlled Substance Database was reviewed by me.  This patient was seen by Jonetta Osgood, FNP-C in collaboration with Dr. Clayborn Bigness as a part of collaborative care agreement.  Trevan Messman R. Valetta Fuller, MSN, FNP-C Internal medicine

## 2021-04-23 ENCOUNTER — Telehealth: Payer: Self-pay

## 2021-04-23 ENCOUNTER — Emergency Department
Admission: EM | Admit: 2021-04-23 | Discharge: 2021-04-23 | Disposition: A | Discharge status: Home / Self Care | Payer: BC Managed Care – PPO | Attending: Emergency Medicine | Admitting: Emergency Medicine

## 2021-04-23 ENCOUNTER — Other Ambulatory Visit: Payer: Self-pay

## 2021-04-23 ENCOUNTER — Emergency Department: Payer: BC Managed Care – PPO

## 2021-04-23 ENCOUNTER — Encounter: Payer: Self-pay | Admitting: Emergency Medicine

## 2021-04-23 DIAGNOSIS — G43001 Migraine without aura, not intractable, with status migrainosus: Secondary | ICD-10-CM | POA: Diagnosis present

## 2021-04-23 DIAGNOSIS — Z20822 Contact with and (suspected) exposure to covid-19: Secondary | ICD-10-CM | POA: Diagnosis not present

## 2021-04-23 LAB — RESP PANEL BY RT-PCR (FLU A&B, COVID) ARPGX2
Influenza A by PCR: NEGATIVE
Influenza B by PCR: NEGATIVE
SARS Coronavirus 2 by RT PCR: NEGATIVE

## 2021-04-23 MED ORDER — METOCLOPRAMIDE HCL 5 MG/ML IJ SOLN
10.0000 mg | Freq: Once | INTRAMUSCULAR | Status: AC
  Administered 2021-04-23: 10 mg via INTRAVENOUS
  Filled 2021-04-23: qty 2

## 2021-04-23 MED ORDER — DEXAMETHASONE SODIUM PHOSPHATE 10 MG/ML IJ SOLN
10.0000 mg | Freq: Once | INTRAMUSCULAR | Status: AC
Start: 2021-04-23 — End: 2021-04-23
  Administered 2021-04-23: 10 mg via INTRAVENOUS
  Filled 2021-04-23: qty 1

## 2021-04-23 MED ORDER — SUMATRIPTAN SUCCINATE 50 MG PO TABS: 50.0000 mg | ORAL_TABLET | ORAL | 0 refills | Status: DC | PRN

## 2021-04-23 MED ORDER — SODIUM CHLORIDE 0.9 % IV BOLUS
1000.0000 mL | Freq: Once | INTRAVENOUS | Status: AC
  Administered 2021-04-23: 1000 mL via INTRAVENOUS

## 2021-04-23 MED ORDER — PREDNISONE 10 MG (21) PO TBPK: ORAL_TABLET | ORAL | 0 refills | Status: DC

## 2021-04-23 NOTE — ED Triage Notes (Signed)
Pt presents to ED with c/o of constant headache for 3 days. Pt states she saw PCP and states they gave her Fioricet and some other medication and pt states no relief. Pt denies injury or trauma. Pt denies HX of migraines.   Pt states recently being Tx'ed for sinus infection in Dec.   Pt states floater in L eye but that is normal.

## 2021-04-23 NOTE — ED Provider Notes (Signed)
[facility] Provider Note    Event Date/Time   First MD Initiated Contact with Patient 04/23/21 1251     (approximate)   History   Migraine   HPI  Kayla Jimenez is a 46 y.o. female with history of chronic migraines presents with worsening type headaches.  Patient was started on medication by her PCP yesterday which none of it helped.  Also states this is different than her normal migraine.  Symptoms had started in December and have lingered but worsened over the last 3 days.  She denies fever or chills.  No slurred speech or weakness.      Physical Exam   Triage Vital Signs: ED Triage Vitals  Enc Vitals Group     BP 04/23/21 1244 (!) 180/98     Pulse Rate 04/23/21 1242 86     Resp 04/23/21 1242 16     Temp 04/23/21 1242 98.6 F (37 C)     Temp Source 04/23/21 1242 Oral     SpO2 04/23/21 1242 98 %     Weight 04/23/21 1307 160 lb 7.9 oz (72.8 kg)     Height 04/23/21 1307 5\' 3"  (1.6 m)     Head Circumference --      Peak Flow --      Pain Score 04/23/21 1240 10     Pain Loc --      Pain Edu? --      Excl. in [place]? --     Most recent vital signs: Vitals:   04/23/21 1242 04/23/21 1244  BP:  (!) 180/98  Pulse: 86   Resp: 16   Temp: 98.6 F (37 C)   SpO2: 98%      General: Awake, no distress.   CV:  Good peripheral perfusion. regular rate and  rhythm Resp:  Normal effort. Lungs  Abd:  No distention.   Other:  Cranial nerves II through XII grossly, grips are equal bilaterally   ED Results / Procedures / Treatments   Labs (all labs ordered are listed, but only abnormal results are displayed) Labs Reviewed  RESP PANEL BY RT-PCR (FLU A&B, COVID) [id number]     EKG     RADIOLOGY CT of the head    PROCEDURES:   Procedures   MEDICATIONS ORDERED IN ED: Medications  sodium chloride 0.9 % bolus 1,000 mL (1,000 mLs Intravenous New Bag/Given 04/23/21 1342)  metoCLOPramide (REGLAN) injection 10 mg (10 mg Intravenous Given  04/23/21 1342)  dexamethasone (DECADRON) injection 10 mg (10 mg Intravenous Given 04/23/21 1342)     IMPRESSION / MDM / ASSESSMENT AND PLAN / ED COURSE  I reviewed the triage vital signs and the nursing notes.                              Differential diagnosis includes, but is not limited to, migraine, covid, mass, subdural, subarachnoid hemorrhage  Labs and imaging ordered.  Patient will be given 1 L normal saline, Reglan, and 10 of Decadron.  She did drive herself to the hospital so do not want to give her anything sedating.  CT of the head was reviewed by me and I do not see any acute abnormality.  Confirmed by radiology  Patient states she had a lot of relief with that medication.  She will be discharged with steroid taper to use if headache returns tomorrow.  Her COVID test is negative, flu test negative.  I do not feel that the headache is due to COVID.  Feel this migraine is normal.  She is stable to be discharged.  She is in agreement treatment plan   FINAL CLINICAL IMPRESSION(S) / ED DIAGNOSES   Final diagnoses:  Migraine without aura and with status migrainosus, not intractable     Rx / DC Orders   ED Discharge Orders          Ordered    predniSONE (STERAPRED UNI-PAK 21 TAB) 10 MG (21) TBPK tablet        04/23/21 1510             Note:  This document was prepared using Dragon voice recognition software and may include unintentional dictation errors.    Versie Starks, PA-C 04/23/21 1513    Harvest Dark, MD 04/24/21 2322

## 2021-04-23 NOTE — Discharge Instructions (Signed)
Follow up with your regular doctor if not improving in 3 days Return to the ER if worsening

## 2021-04-23 NOTE — Telephone Encounter (Signed)
Spoke to pt and informed her that Alyssa sent Imitrex to her pharmacy to try for her migraines

## 2021-04-26 ENCOUNTER — Encounter: Payer: Self-pay | Admitting: Nurse Practitioner

## 2021-05-04 LAB — LIPID PANEL
Chol/HDL Ratio: 6.7 ratio — ABNORMAL HIGH (ref 0.0–4.4)
Cholesterol, Total: 229 mg/dL — ABNORMAL HIGH (ref 100–199)
HDL: 34 mg/dL — ABNORMAL LOW (ref >39)
LDL Chol Calc (NIH): 150 mg/dL — ABNORMAL HIGH (ref 0–99)
Triglycerides: 243 mg/dL — ABNORMAL HIGH (ref 0–149)
VLDL Cholesterol Cal: 45 mg/dL — ABNORMAL HIGH (ref 5–40)

## 2021-05-09 NOTE — Progress Notes (Signed)
Please call patient and let her know that the repeat lipid panel is significantly abnormal. I would like to have a follow up visit to discuss in person. We most likely need to start medication for it with high intensity statin therapy. If she does not want to come in but is ok with me starting medication for it, I would like to use rosuvastatin 10 mg daily.

## 2021-05-10 NOTE — Progress Notes (Signed)
Lmom to call back. 

## 2021-05-16 ENCOUNTER — Telehealth: Payer: Self-pay

## 2021-05-16 NOTE — Telephone Encounter (Signed)
-----   Message from Jonetta Osgood, NP sent at 05/09/2021  8:48 AM EST ----- Please call patient and let her know that the repeat lipid panel is significantly abnormal. I would like to have a follow up visit to discuss in person. We most likely need to start medication for it with high intensity statin therapy. If she does not want to come in but is ok with me starting medication for it, I would like to use rosuvastatin 10 mg daily.

## 2021-05-16 NOTE — Telephone Encounter (Signed)
LMOM pt needs appt to discuss labs in person and may need to start med

## 2021-05-17 ENCOUNTER — Telehealth: Payer: Self-pay

## 2021-05-17 NOTE — Telephone Encounter (Signed)
LMOM to review labs  

## 2021-05-17 NOTE — Telephone Encounter (Signed)
-----   Message from Jonetta Osgood, NP sent at 05/09/2021  8:48 AM EST ----- Please call patient and let her know that the repeat lipid panel is significantly abnormal. I would like to have a follow up visit to discuss in person. We most likely need to start medication for it with high intensity statin therapy. If she does not want to come in but is ok with me starting medication for it, I would like to use rosuvastatin 10 mg daily.

## 2021-05-18 ENCOUNTER — Ambulatory Visit
Admission: EM | Admit: 2021-05-18 | Discharge: 2021-05-18 | Disposition: A | Discharge status: Home / Self Care | Payer: BC Managed Care – PPO | Attending: Family Medicine | Admitting: Family Medicine

## 2021-05-18 ENCOUNTER — Encounter: Payer: Self-pay | Admitting: Emergency Medicine

## 2021-05-18 ENCOUNTER — Other Ambulatory Visit: Payer: Self-pay

## 2021-05-18 DIAGNOSIS — J029 Acute pharyngitis, unspecified: Secondary | ICD-10-CM

## 2021-05-18 DIAGNOSIS — J069 Acute upper respiratory infection, unspecified: Secondary | ICD-10-CM

## 2021-05-18 LAB — POCT RAPID STREP A (OFFICE): Rapid Strep A Screen: NEGATIVE

## 2021-05-18 MED ORDER — LIDOCAINE VISCOUS HCL 2 % MT SOLN: 10.0000 mL | OROMUCOSAL | 0 refills | Status: DC | PRN

## 2021-05-18 MED ORDER — PREDNISONE 50 MG PO TABS: ORAL_TABLET | ORAL | 0 refills | Status: DC

## 2021-05-18 NOTE — ED Provider Notes (Signed)
[facility]    CSN: [id number] Arrival date & time: 05/18/21  0825      History   Chief Complaint Chief Complaint  Patient presents with   Sore Throat    HPI Kayla Jimenez is a 46 y.o. female.   Presenting today with several day history of difficulty swallowing, sore swollen throat worse on the left, nasal congestion, postnasal drip.  Denies known fever, chills, chest pain, shortness of breath, abdominal pain, nausea vomiting or diarrhea.  Trying over-the-counter cold and congestion medication with minimal relief of the sore throat with some relief of the congestion.  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  Does have a history of seasonal allergies compliant with her allergy regimen.   Past Medical History:  Diagnosis Date   Anxiety 08/29/10   Bursitis    left foot   BV (bacterial vaginosis) 0/93/11   Complication of anesthesia 2004   vomited   Gross hematuria 07/08/2015   H/O bladder infections    H/O mumps    H/O toxoplasmosis    H/O varicella    History of kidney stones    Infections of kidney    several kidney stones, bladder infections   Kidney disease 09/03/2011   Left ureteral calculus with partial obstruction, passed 06/09/2014. CT did show perinephric stranding.    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   Loss of hair 10/07/2018   Body mass index (BMI) of 25.0 to 29.9 10/07/2018   Hematuria 06/28/2018   Right flank pain 06/28/2018   H/O:  hysterectomy 11/19/2017   Dysuria 10/20/2017   Allergic rhinitis due to pollen 08/12/2017   Attention and concentration deficit 08/12/2017   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   Primary insomnia 08/12/2017   Irregular bleeding 03/16/2017   Plantar fasciitis 03/10/2017   SUI (stress urinary incontinence, female) 08/14/2015   Cystocele, midline 08/14/2015   Renal calculi 07/26/2015   Urinary tract infection with hematuria 07/08/2015   Gross hematuria 07/08/2015   History of renal stone 07/08/2015   Abdominal bloating 07/10/2014   Gallstones 06/13/2014   Yeast infection 09/03/2011   H/O varicella 09/03/2011   H/O mumps 09/03/2011   Post - coital bleeding 09/03/2011   Anxiety 09/03/2011   Premenstrual tension syndrome 09/03/2011   H/O bladder infections 09/03/2011   Kidney disease 09/03/2011   Menorrhagia 07/10/2011    Past Surgical History:  Procedure Laterality Date   ABDOMINAL HYSTERECTOMY     ANTERIOR AND POSTERIOR REPAIR  03/16/2012   Procedure: ANTERIOR (CYSTOCELE) AND POSTERIOR REPAIR (RECTOCELE);  Surgeon: [name], MD;  Location: [facility];  Service: Gynecology;  Laterality: N/A;  anterior repair/cysto   BLADDER SUSPENSION  03/16/2012   Procedure: TRANSVAGINAL TAPE (TVT) PROCEDURE;  Surgeon: [name], MD;  Location: [facility];  Service: Gynecology;  Laterality: N/A;   BREAST REDUCTION SURGERY  6/04   CHOLECYSTECTOMY  06/28/14   CYSTOSCOPY  03/16/2012   Procedure: CYSTOSCOPY;  Surgeon:  [name], MD;  Location: [facility];  Service: Gynecology;  Laterality: N/A;   CYSTOSCOPY  03/16/2017   Procedure: CYSTOSCOPY;  Surgeon: Everett Graff, MD;  Location: [facility];  Service: Gynecology;;   [place]  03/16/2012   Procedure: DILATATION & CURETTAGE/HYSTEROSCOPY WITH NOVASURE ABLATION;  Surgeon: [name], MD;  Location: [facility];  Service: Gynecology;  Laterality: N/A;   TONSILLECTOMY AND ADENOIDECTOMY      VAGINAL HYSTERECTOMY Bilateral 03/16/2017   Procedure: HYSTERECTOMY VAGINAL Bilateral Salpingectomy ;  Surgeon: Everett Graff, MD;  Location: [facility];  Service: Gynecology;  Laterality: Bilateral;    OB History     Gravida  2   Para  2   Term  2   Preterm      AB      Living  2      SAB      IAB      Ectopic      Multiple      Live Births  2            Home Medications    Prior to Admission medications   Medication Sig Start Date End Date Taking? Authorizing Provider  lidocaine (XYLOCAINE) 2 % solution Use as directed 10 mLs in the mouth or throat every 3 (three) hours as needed for mouth pain. 05/18/21  Yes Volney American, PA-C  predniSONE (DELTASONE) 50 MG tablet Take 1 tab daily with breakfast for 3 days 05/18/21  Yes Volney American, PA-C  buPROPion ER [facility] SR) 100 MG 12 hr tablet TAKE 1 TABLET BY MOUTH EVERY DAY 02/06/21   Jonetta Osgood, NP  Cranberry-Vitamin C (CRANBERRY CONCENTRATE/VITAMINC) 15000-100 MG CAPS Take by mouth.    [provider]  cyanocobalamin (,VITAMIN B-12,) 1000 MCG/ML injection once aweek for 3 weeks than once a month 02/29/20   Luiz Ochoa, NP  eszopiclone (LUNESTA) 2 MG TABS tablet Take 1 tablet (2 mg total) by mouth at bedtime as needed for sleep. Take immediately before bedtime 04/22/21   Jonetta Osgood, NP  INSULIN SYRINGE 1CC/29G (SAFETY INSULIN SYRINGES) 29G X 1/2" 1 ML MISC Use for b12 injection 02/29/20   Luiz Ochoa, NP  montelukast (SINGULAIR) 10 MG tablet TAKE 1 TABLET BY MOUTH EVERYDAY AT BEDTIME 11/07/20   Jonetta Osgood, NP  predniSONE (STERAPRED UNI-PAK 21 TAB) 10 MG (21) TBPK tablet Take 6 pills on day one then decrease by 1 pill each day 04/23/21   Versie Starks, PA-C  Semaglutide (RYBELSUS) 7 MG TABS Take 7 mg by mouth daily. 04/22/21   Jonetta Osgood, NP  sertraline (ZOLOFT) 50 MG tablet Take 1.5 tablets (75 mg total) by mouth at bedtime. 11/07/20   Jonetta Osgood, NP   SUMAtriptan (IMITREX) 50 MG tablet Take 1 tablet (50 mg total) by mouth every 2 (two) hours as needed for migraine. May repeat in 2 hours if migraine persists or recurs.max 200 mg per 24 hours. 04/23/21   Jonetta Osgood, NP  Vitamin D, Ergocalciferol, (DRISDOL) 1.25 MG (50000 UNIT) CAPS capsule TAKE 1 CAPSULE BY MOUTH ONE TIME PER WEEK 02/05/21   Jonetta Osgood, NP    Family History Family History  Problem Relation Age of Onset   Cancer Other        breast   Cancer Mother        cervical   Depression Mother    Drug abuse Mother    Anemia Mother  blood transfusion   Migraines Mother     Social History Social History   Tobacco Use   Smoking status: Never   Smokeless tobacco: Never  Vaping Use   Vaping Use: Never used  Substance Use Topics   Alcohol use: No   Drug use: No     Allergies   Percocet [oxycodone-acetaminophen], Sulfa antibiotics, and Sulfonamide derivatives   Review of Systems Review of Systems Per HPI  Physical Exam Triage Vital Signs ED Triage Vitals  Enc Vitals Group     BP 05/18/21 1014 138/87     Pulse Rate 05/18/21 1014 (!) 103     Resp 05/18/21 1014 18     Temp 05/18/21 1014 98.7 F (37.1 C)     Temp Source 05/18/21 1014 Oral     SpO2 05/18/21 1014 95 %     Weight 05/18/21 1015 157 lb (71.2 kg)     Height 05/18/21 1015 5\' 3"  (1.6 m)     Head Circumference --      Peak Flow --      Pain Score 05/18/21 1014 6     Pain Loc --      Pain Edu? --      Excl. in [place]? --    No data found.  Updated Vital Signs BP 138/87 (BP Location: Right Arm)    Pulse (!) 103    Temp 98.7 F (37.1 C) (Oral)    Resp 18    Ht 5\' 3"  (1.6 m)    Wt 157 lb (71.2 kg)    LMP 02/23/2017 (Exact Date)    SpO2 95%    BMI 27.81 kg/m   Visual Acuity Right Eye Distance:   Left Eye Distance:   Bilateral Distance:    Right Eye Near:   Left Eye Near:    Bilateral Near:     Physical Exam Vitals and nursing note reviewed.  Constitutional:      Appearance:  Normal appearance.  HENT:     Head: Atraumatic.     Right Ear: Tympanic membrane and external ear normal.     Left Ear: Tympanic membrane and external ear normal.     Nose: Rhinorrhea present.     Mouth/Throat:     Mouth: Mucous membranes are moist.     Pharynx: Posterior oropharyngeal erythema present. No oropharyngeal exudate.     Comments: Posterior oropharyngeal and tonsillar erythema, worse on the left with tonsillar edema on the left.  Uvula midline, oral airway patent Eyes:     Extraocular Movements: Extraocular movements intact.     Conjunctiva/sclera: Conjunctivae normal.  Cardiovascular:     Rate and Rhythm: Normal rate and regular rhythm.     Heart sounds: Normal heart sounds.  Pulmonary:     Effort: Pulmonary effort is normal.     Breath sounds: Normal breath sounds. No wheezing or rales.  Musculoskeletal:        General: Normal range of motion.     Cervical back: Normal range of motion and neck supple.  Skin:    General: Skin is warm and dry.  Neurological:     Mental Status: She is alert and oriented to person, place, and time.     Motor: No weakness.     Gait: Gait normal.  Psychiatric:        Mood and Affect: Mood normal.        Thought Content: Thought content normal.     UC Treatments / Results  Labs (all  labs ordered are listed, but only abnormal results are displayed) Labs Reviewed  CULTURE, GROUP A STREP ([place])  COVID-19, FLU A+B NAA  POCT RAPID STREP A (OFFICE)    EKG   Radiology No results found.  Procedures Procedures (including critical care time)  Medications Ordered in UC Medications - No data to display  Initial Impression / Assessment and Plan / UC Course  I have reviewed the triage vital signs and the nursing notes.  Pertinent labs & imaging results that were available during my care of the patient were reviewed by me and considered in my medical decision making (see chart for details).     Mildly tachycardic in triage,  otherwise vital signs overall reassuring.  Rapid strep negative, throat culture and COVID and flu testing pending.  We will treat with prednisone, viscous lidocaine while awaiting results.  Return for acutely worsening symptoms.  Final Clinical Impressions(s) / UC Diagnoses   Final diagnoses:  Sore throat  Viral URI   Discharge Instructions   None    ED Prescriptions     Medication Sig Dispense Auth. Provider   predniSONE (DELTASONE) 50 MG tablet Take 1 tab daily with breakfast for 3 days 3 tablet Volney American, PA-C   lidocaine (XYLOCAINE) 2 % solution Use as directed 10 mLs in the mouth or throat every 3 (three) hours as needed for mouth pain. 100 mL Volney American, [place]      PDMP not reviewed this encounter.   Volney American, [place] 05/18/21 1126

## 2021-05-18 NOTE — ED Triage Notes (Signed)
Pt reports dysphagia, left ear pain for last several days. Pt reports sore throat since last night. Airway patent. Moderate redness noted to left side of throat.

## 2021-05-20 ENCOUNTER — Telehealth (HOSPITAL_COMMUNITY): Payer: Self-pay | Admitting: Emergency Medicine

## 2021-05-20 LAB — COVID-19, FLU A+B NAA
Influenza A, NAA: NOT DETECTED
Influenza B, NAA: NOT DETECTED
SARS-CoV-2, NAA: NOT DETECTED

## 2021-05-20 LAB — CULTURE, GROUP A STREP (THRC)

## 2021-05-20 MED ORDER — AMOXICILLIN 500 MG PO CAPS: 500.0000 mg | ORAL_CAPSULE | Freq: Two times a day (BID) | ORAL | 0 refills | Status: DC

## 2021-05-27 ENCOUNTER — Encounter: Payer: Self-pay | Admitting: Nurse Practitioner

## 2021-05-27 ENCOUNTER — Other Ambulatory Visit: Payer: Self-pay

## 2021-05-27 ENCOUNTER — Ambulatory Visit: Payer: BC Managed Care – PPO | Admitting: Nurse Practitioner

## 2021-05-27 VITALS — BP 140/80 | HR 88 | Temp 98.4°F | Resp 16 | Ht 63.0 in | Wt 155.8 lb

## 2021-05-27 DIAGNOSIS — E559 Vitamin D deficiency, unspecified: Secondary | ICD-10-CM | POA: Diagnosis not present

## 2021-05-27 DIAGNOSIS — E782 Mixed hyperlipidemia: Secondary | ICD-10-CM

## 2021-05-27 DIAGNOSIS — J301 Allergic rhinitis due to pollen: Secondary | ICD-10-CM | POA: Diagnosis not present

## 2021-05-27 DIAGNOSIS — E663 Overweight: Secondary | ICD-10-CM | POA: Diagnosis not present

## 2021-05-27 DIAGNOSIS — Z6827 Body mass index (BMI) 27.0-27.9, adult: Secondary | ICD-10-CM

## 2021-05-27 MED ORDER — RYBELSUS 3 MG PO TABS: ORAL_TABLET | ORAL | 3 refills | Status: DC

## 2021-05-27 MED ORDER — ROSUVASTATIN CALCIUM 5 MG PO TABS: 5.0000 mg | ORAL_TABLET | Freq: Every day | ORAL | 2 refills | Status: DC

## 2021-05-27 MED ORDER — VITAMIN D (ERGOCALCIFEROL) 1.25 MG (50000 UNIT) PO CAPS: 50000.0000 [IU] | ORAL_CAPSULE | ORAL | 1 refills | Status: DC

## 2021-05-27 MED ORDER — MONTELUKAST SODIUM 10 MG PO TABS: ORAL_TABLET | ORAL | 1 refills | Status: DC

## 2021-05-27 NOTE — Progress Notes (Unsigned)
[facility] [place], [place] [address]  Internal MEDICINE  Office Visit Note  Patient Name: Kayla Jimenez  [id number]  [id number]  Date of Service: 05/27/2021  Chief Complaint  Patient presents with   Follow-up   Anxiety   Results    HPI Kayla Jimenez presents for a follow-up visit for  140/80 Start rosuvastatin Decrease rybelsus    Current Medication: Outpatient Encounter Medications as of 05/27/2021  Medication Sig   buPROPion ER (WELLBUTRIN SR) 100 MG 12 hr tablet TAKE 1 TABLET BY MOUTH EVERY DAY   Cranberry-Vitamin C (CRANBERRY CONCENTRATE/VITAMINC) 15000-100 MG CAPS Take by mouth.   cyanocobalamin (,VITAMIN B-12,) 1000 MCG/ML injection once aweek for 3 weeks than once a month   eszopiclone (LUNESTA) 2 MG TABS tablet Take 1 tablet (2 mg total) by mouth at bedtime as needed for sleep. Take immediately before bedtime   INSULIN SYRINGE 1CC/29G (SAFETY INSULIN SYRINGES) 29G X 1/2" 1 ML MISC Use for b12 injection   lidocaine (XYLOCAINE) 2 % solution Use as directed 10 mLs in the mouth or throat every 3 (three) hours as needed for mouth pain.   predniSONE (STERAPRED UNI-PAK 21 TAB) 10 MG (21) TBPK tablet Take 6 pills on day one then decrease by 1 pill each day   rosuvastatin (CRESTOR) 5 MG tablet Take 1 tablet (5 mg total) by mouth daily.   Semaglutide (RYBELSUS) 3 MG TABS Take one tab po qd in am on empty stomach   sertraline (ZOLOFT) 50 MG tablet Take 1.5 tablets (75 mg total) by mouth at bedtime.   SUMAtriptan (IMITREX) 50 MG tablet Take 1 tablet (50 mg total) by mouth every 2 (two) hours as needed for migraine. May repeat in 2 hours if migraine persists or recurs.max 200 mg per 24 hours.   [DISCONTINUED] montelukast (SINGULAIR) 10 MG tablet TAKE 1 TABLET BY MOUTH EVERYDAY AT BEDTIME   [DISCONTINUED] Semaglutide (RYBELSUS) 7 MG TABS Take 7 mg by mouth daily.   [DISCONTINUED] Vitamin D, Ergocalciferol, (DRISDOL) 1.25 MG (50000 UNIT) CAPS capsule TAKE 1  CAPSULE BY MOUTH ONE TIME PER WEEK   montelukast (SINGULAIR) 10 MG tablet TAKE 1 TABLET BY MOUTH EVERYDAY AT BEDTIME   Vitamin D, Ergocalciferol, (DRISDOL) 1.25 MG (50000 UNIT) CAPS capsule Take 1 capsule (50,000 Units total) by mouth every 7 (seven) days.   [DISCONTINUED] amoxicillin (AMOXIL) 500 MG capsule Take 1 capsule (500 mg total) by mouth 2 (two) times daily for 10 days. (Patient not taking: Reported on 05/27/2021)   [DISCONTINUED] predniSONE (DELTASONE) 50 MG tablet Take 1 tab daily with breakfast for 3 days (Patient not taking: Reported on 05/27/2021)   No facility-administered encounter medications on file as of 05/27/2021.    Surgical History: Past Surgical History:  Procedure Laterality Date   ABDOMINAL HYSTERECTOMY     ANTERIOR AND POSTERIOR REPAIR  03/16/2012   Procedure: ANTERIOR (CYSTOCELE) AND POSTERIOR REPAIR (RECTOCELE);  Surgeon: [name], MD;  Location: [facility];  Service: Gynecology;  Laterality: N/A;  anterior repair/cysto   BLADDER SUSPENSION  03/16/2012   Procedure: TRANSVAGINAL TAPE (TVT) PROCEDURE;  Surgeon: [name], MD;  Location: [facility];  Service: Gynecology;  Laterality: N/A;   BREAST REDUCTION SURGERY  6/04   CHOLECYSTECTOMY  06/28/14   CYSTOSCOPY  03/16/2012   Procedure: CYSTOSCOPY;  Surgeon: [name], MD;  Location: [facility];  Service: Gynecology;  Laterality: N/A;   CYSTOSCOPY  03/16/2017   Procedure: CYSTOSCOPY;  Surgeon: Everett Graff, MD;  Location:  [facility];  Service: Gynecology;;   [place]  03/16/2012   Procedure: DILATATION & CURETTAGE/HYSTEROSCOPY WITH NOVASURE ABLATION;  Surgeon: [name], MD;  Location: [facility];  Service: Gynecology;  Laterality: N/A;   TONSILLECTOMY AND ADENOIDECTOMY     VAGINAL HYSTERECTOMY Bilateral 03/16/2017   Procedure: HYSTERECTOMY VAGINAL Bilateral Salpingectomy ;  Surgeon: Everett Graff, MD;  Location: [facility];  Service: Gynecology;  Laterality:  Bilateral;    Medical History: Past Medical History:  Diagnosis Date   Anxiety 08/29/10   Bursitis    left foot   BV (bacterial vaginosis) 5/72/62   Complication of anesthesia 2004   vomited   Gross hematuria 07/08/2015   H/O bladder infections    H/O mumps    H/O toxoplasmosis    H/O varicella    History of kidney stones    Infections of kidney    several kidney stones, bladder infections   Kidney disease 09/03/2011   Left ureteral calculus with partial obstruction, passed 06/09/2014. CT did show perinephric stranding.    Menometrorrhagia 06/26/10   Plantar fasciitis    PMS (premenstrual syndrome) 07/10/11   Post - coital bleeding 08/29/10   Renal calculus, left    Sinus infection    UTI (lower urinary tract infection) 07/08/2015   Yeast infection     Family History: Family History  Problem Relation Age of Onset   Cancer Other        breast   Cancer Mother        cervical   Depression Mother    Drug abuse Mother    Anemia Mother        blood transfusion   Migraines Mother     Social History   Socioeconomic History   Marital status: Married    Spouse name: Not on file   Number of children: Not on file   Years of education: Not on file   Highest education level: Not on file  Occupational History   Not on file  Tobacco Use   Smoking status: Never   Smokeless tobacco: Never  Vaping Use   Vaping Use: Never used  Substance and Sexual Activity   Alcohol use: No   Drug use: No   Sexual activity: Yes    Birth control/protection: Surgical    Comment: pt spouse had vasec.   Other Topics Concern   Not on file  Social History Narrative   Not on file   Social Determinants of Health   Financial Resource Strain: Not on file  Food Insecurity: Not on file  Transportation Needs: Not on file  Physical Activity: Not on file  Stress: Not on file  Social Connections: Not on file  Intimate Partner Violence: Not on file      Review of Systems  Vital Signs: BP (!)  160/90    Pulse 88    Temp 98.4 F (36.9 C)    Resp 16    Ht 5\' 3"  (1.6 m)    Wt 155 lb 12.8 oz (70.7 kg)    LMP 02/23/2017 (Exact Date)    SpO2 99%    BMI 27.60 kg/m    Physical Exam     Assessment/Plan:   General Counseling: Kayla Jimenez verbalizes understanding of the findings of todays visit and agrees with plan of treatment. I have discussed any further diagnostic evaluation that may be needed or ordered today. We also reviewed her medications today. she has been encouraged to call the office with  any questions or concerns that should arise related to todays visit.    No orders of the defined types were placed in this encounter.   Meds ordered this encounter  Medications   rosuvastatin (CRESTOR) 5 MG tablet    Sig: Take 1 tablet (5 mg total) by mouth daily.    Dispense:  30 tablet    Refill:  2   Semaglutide (RYBELSUS) 3 MG TABS    Sig: Take one tab po qd in am on empty stomach    Dispense:  30 tablet    Refill:  3    Note decreased dose. Discontinue rybelsus 7 mg tablet   montelukast (SINGULAIR) 10 MG tablet    Sig: TAKE 1 TABLET BY MOUTH EVERYDAY AT BEDTIME    Dispense:  90 tablet    Refill:  1   Vitamin D, Ergocalciferol, (DRISDOL) 1.25 MG (50000 UNIT) CAPS capsule    Sig: Take 1 capsule (50,000 Units total) by mouth every 7 (seven) days.    Dispense:  12 capsule    Refill:  1    Return in about 1 month (around 06/24/2021) for F/U, eval new med, [place] PCP.   Total time spent:*** Minutes Time spent includes review of chart, medications, test results, and follow up plan with the patient.   [place] Controlled Substance Database was reviewed by me.  This patient was seen by Jonetta Osgood, FNP-C in collaboration with Dr. Clayborn Bigness as a part of collaborative care agreement.   Eppie Barhorst R. Valetta Fuller, MSN, FNP-C Internal medicine

## 2021-06-05 ENCOUNTER — Ambulatory Visit (INDEPENDENT_AMBULATORY_CARE_PROVIDER_SITE_OTHER): Payer: BC Managed Care – PPO | Admitting: Nurse Practitioner

## 2021-06-05 ENCOUNTER — Other Ambulatory Visit: Payer: Self-pay

## 2021-06-05 ENCOUNTER — Encounter: Payer: Self-pay | Admitting: Nurse Practitioner

## 2021-06-05 VITALS — BP 140/90 | HR 91 | Temp 98.5°F | Resp 16 | Ht 63.0 in | Wt 157.4 lb

## 2021-06-05 DIAGNOSIS — J020 Streptococcal pharyngitis: Secondary | ICD-10-CM

## 2021-06-05 DIAGNOSIS — J029 Acute pharyngitis, unspecified: Secondary | ICD-10-CM | POA: Diagnosis not present

## 2021-06-05 LAB — POCT RAPID STREP A (OFFICE): Rapid Strep A Screen: NEGATIVE

## 2021-06-05 MED ORDER — AMOXICILLIN-POT CLAVULANATE 875-125 MG PO TABS: 1.0000 | ORAL_TABLET | Freq: Two times a day (BID) | ORAL | 0 refills | Status: AC

## 2021-06-05 NOTE — Progress Notes (Signed)
[facility] ?[address] ?[place], [place] [address] ? ?Internal MEDICINE  ?Office Visit Note ? ?Patient Name: Kayla Jimenez ? [id number]  ?[id number] ? ?Date of Service: 06/05/2021 ? ?Chief Complaint  ?Patient presents with  ? Acute Visit  ?  Drainage down throat   ? Sore Throat  ? Ear Pain  ?  Right   ? ? ? ?HPI ?Kayla Jimenez presents for an acute sick visit for possible strep throat. 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 She had a strep infection in mid February and was treated with amoxicillin 500 mg twice daily but the symptoms returned. Strep was negative today. It was negative last time but was sent for culture and came back positive.  ?Reports that her throat is painful and sharp, and difficult and painful to swallow. Also right ear pain ? ?Current Medication: ? ?Outpatient Encounter Medications as of 06/05/2021  ?Medication Sig  ? amoxicillin-clavulanate (AUGMENTIN) 875-125 MG tablet Take 1 tablet by mouth 2 (two) times daily for 14 days.  ? buPROPion ER (WELLBUTRIN SR) 100 MG 12 hr tablet TAKE 1 TABLET BY MOUTH EVERY DAY  ? Cranberry-Vitamin C (CRANBERRY CONCENTRATE/VITAMINC) 15000-100 MG CAPS Take by mouth.  ? cyanocobalamin (,VITAMIN B-12,) 1000 MCG/ML injection once aweek for 3 weeks than once a month  ? eszopiclone (LUNESTA) 2 MG TABS tablet Take 1 tablet (2 mg total) by mouth at bedtime as needed for sleep. Take immediately before bedtime  ? INSULIN SYRINGE 1CC/29G (SAFETY INSULIN SYRINGES) 29G X 1/2" 1 ML MISC Use for b12 injection  ? lidocaine (XYLOCAINE) 2 % solution Use as directed 10 mLs in the mouth or throat every 3 (three) hours as needed for mouth pain.  ? montelukast (SINGULAIR) 10 MG tablet TAKE 1 TABLET BY MOUTH EVERYDAY AT BEDTIME  ? predniSONE (STERAPRED UNI-PAK 21 TAB) 10 MG (21) TBPK tablet Take 6 pills on day one then decrease by 1 pill each day  ? rosuvastatin (CRESTOR) 5 MG tablet Take 1 tablet (5 mg total) by mouth daily.  ? Semaglutide  (RYBELSUS) 3 MG TABS Take one tab po qd in am on empty stomach  ? sertraline (ZOLOFT) 50 MG tablet Take 1.5 tablets (75 mg total) by mouth at bedtime.  ? SUMAtriptan (IMITREX) 50 MG tablet Take 1 tablet (50 mg total) by mouth every 2 (two) hours as needed for migraine. May repeat in 2 hours if migraine persists or recurs.max 200 mg per 24 hours.  ? Vitamin D, Ergocalciferol, (DRISDOL) 1.25 MG (50000 UNIT) CAPS capsule Take 1 capsule (50,000 Units total) by mouth every 7 (seven) days.  ? ?No facility-administered encounter medications on file as of 06/05/2021.  ? ? ? ? ?Medical History: ?Past Medical History:  ?Diagnosis Date  ? Anxiety 08/29/10  ? Bursitis   ? left foot  ? BV (bacterial vaginosis) 08/29/10  ? Complication of anesthesia 2004  ? vomited  ? Gross hematuria 07/08/2015  ? H/O bladder infections   ? H/O mumps   ? H/O toxoplasmosis   ? H/O varicella   ? History of kidney stones   ? Infections of kidney   ? several kidney stones, bladder infections  ? Kidney disease 09/03/2011  ? Left ureteral calculus with partial obstruction, passed 06/09/2014. CT did show perinephric stranding.   ? Menometrorrhagia 06/26/10  ? Plantar fasciitis   ? PMS (premenstrual syndrome) 07/10/11  ? Post - coital bleeding 08/29/10  ? Renal calculus, left   ?  Sinus infection   ? UTI (lower urinary tract infection) 07/08/2015  ? Yeast infection   ? ? ? ?Vital Signs: ?BP 140/90   Pulse 91   Temp 98.5 ?F (36.9 ?C)   Resp 16   Ht 5\' 3"  (1.6 m)   Wt 157 lb 6.4 oz (71.4 kg)   LMP 02/23/2017 (Exact Date)   SpO2 97%   BMI 27.88 kg/m?  ? ? ?Review of Systems  ?Constitutional:  Negative for chills, fatigue and fever.  ?HENT:  Positive for congestion, ear pain, postnasal drip, sore throat and trouble swallowing.   ?Eyes:  Negative for pain.  ?Respiratory: Negative.  Negative for cough, chest tightness, shortness of breath and wheezing.   ?Cardiovascular: Negative.  Negative for chest pain and palpitations.  ?Gastrointestinal: Negative.  Negative for  abdominal pain, diarrhea and nausea.  ?Neurological:  Negative for headaches.  ? ?Physical Exam ?Vitals reviewed.  ?Constitutional:   ?   General: She is not in acute distress. ?   Appearance: She is well-developed. She is ill-appearing.  ?HENT:  ?   Head: Normocephalic and atraumatic.  ?   Right Ear: Tympanic membrane and ear canal normal. Tenderness present.  ?   Left Ear: Tympanic membrane and ear canal normal.  ?   Nose: Congestion present. No rhinorrhea.  ?   Mouth/Throat:  ?   Mouth: Mucous membranes are moist.  ?   Pharynx: Uvula midline. Pharyngeal swelling, oropharyngeal exudate, posterior oropharyngeal erythema and uvula swelling present.  ?   Tonsils: 0 on the right. 0 on the left.  ?Cardiovascular:  ?   Rate and Rhythm: Normal rate and regular rhythm.  ?Pulmonary:  ?   Effort: Pulmonary effort is normal. No respiratory distress.  ?Neurological:  ?   Mental Status: She is alert and oriented to person, place, and time.  ?Psychiatric:     ?   Mood and Affect: Mood normal.     ?   Behavior: Behavior normal.  ? ? ? ? ?Assessment/Plan: ?1. Strep pharyngitis ?Treatment failure- recurrent infection after initial treatment in mid February. Augmentin prescribed for 14 days. May return to work on Friday if she feels able.  ?- amoxicillin-clavulanate (AUGMENTIN) 875-125 MG tablet; Take 1 tablet by mouth 2 (two) times daily for 14 days.  Dispense: 28 tablet; Refill: 0 ? ?2. Sore throat ?Strep negative but clinical manifestations of strep are present. With recent treatment within a month for strep.  ?- POCT rapid strep A ? ? ?General Counseling: Kayla Jimenez verbalizes understanding of the findings of todays visit and agrees with plan of treatment. I have discussed any further diagnostic evaluation that may be needed or ordered today. We also reviewed her medications today. she has been encouraged to call the office with any questions or concerns that should arise related to todays visit. ? ? ? ?Counseling: ? ? ? ?Orders  Placed This Encounter  ?Procedures  ? POCT rapid strep A  ? ? ?Meds ordered this encounter  ?Medications  ? amoxicillin-clavulanate (AUGMENTIN) 875-125 MG tablet  ?  Sig: Take 1 tablet by mouth 2 (two) times daily for 14 days.  ?  Dispense:  28 tablet  ?  Refill:  0  ? ? ?Return if symptoms worsen or fail to improve. ? ?[place] Controlled Substance Database was reviewed by me for overdose risk score (ORS) ? ?Time spent:15 Minutes ?Time spent with patient included reviewing progress notes, labs, imaging studies, and discussing plan for follow up.  ? ?This patient  was seen by Jonetta Osgood, FNP-C in collaboration with Dr. Clayborn Bigness as a part of collaborative care agreement. ? ?Avyon Herendeen R. Valetta Fuller, MSN, FNP-C ?Internal Medicine ?

## 2021-06-25 ENCOUNTER — Ambulatory Visit: Payer: BC Managed Care – PPO | Admitting: Nurse Practitioner

## 2021-07-01 ENCOUNTER — Encounter: Payer: Self-pay | Admitting: Nurse Practitioner

## 2021-07-09 ENCOUNTER — Emergency Department: Payer: BC Managed Care – PPO

## 2021-07-09 ENCOUNTER — Other Ambulatory Visit: Payer: Self-pay

## 2021-07-09 ENCOUNTER — Emergency Department
Admission: EM | Admit: 2021-07-09 | Discharge: 2021-07-09 | Disposition: A | Discharge status: Home / Self Care | Payer: BC Managed Care – PPO | Attending: Emergency Medicine | Admitting: Emergency Medicine

## 2021-07-09 DIAGNOSIS — N132 Hydronephrosis with renal and ureteral calculous obstruction: Secondary | ICD-10-CM | POA: Diagnosis not present

## 2021-07-09 DIAGNOSIS — N201 Calculus of ureter: Secondary | ICD-10-CM

## 2021-07-09 DIAGNOSIS — R1031 Right lower quadrant pain: Secondary | ICD-10-CM | POA: Diagnosis present

## 2021-07-09 LAB — URINALYSIS, ROUTINE W REFLEX MICROSCOPIC
Bacteria, UA: NONE SEEN
Bilirubin Urine: NEGATIVE
Glucose, UA: NEGATIVE mg/dL
Ketones, ur: NEGATIVE mg/dL
Leukocytes,Ua: NEGATIVE
Nitrite: NEGATIVE
Protein, ur: 100 mg/dL — AB
RBC / HPF: >50 RBC/hpf — ABNORMAL HIGH (ref 0–5)
Specific Gravity, Urine: 1.024 (ref 1.005–1.030)
pH: 5 (ref 5.0–8.0)

## 2021-07-09 LAB — CBC
HCT: 40.1 % (ref 36.0–46.0)
Hemoglobin: 13.7 g/dL (ref 12.0–15.0)
MCH: 31.8 pg (ref 26.0–34.0)
MCHC: 34.2 g/dL (ref 30.0–36.0)
MCV: 93 fL (ref 80.0–100.0)
Platelets: 325 10*3/uL (ref 150–400)
RBC: 4.31 MIL/uL (ref 3.87–5.11)
RDW: 11.1 % — ABNORMAL LOW (ref 11.5–15.5)
WBC: 13.4 10*3/uL — ABNORMAL HIGH (ref 4.0–10.5)
nRBC: 0 % (ref 0.0–0.2)

## 2021-07-09 LAB — BASIC METABOLIC PANEL
Anion gap: 7 (ref 5–15)
BUN: 11 mg/dL (ref 6–20)
CO2: 26 mmol/L (ref 22–32)
Calcium: 8.6 mg/dL — ABNORMAL LOW (ref 8.9–10.3)
Chloride: 105 mmol/L (ref 98–111)
Creatinine, Ser: 0.68 mg/dL (ref 0.44–1.00)
GFR, Estimated: >60 mL/min (ref >60)
Glucose, Bld: 104 mg/dL — ABNORMAL HIGH (ref 70–99)
Potassium: 3.6 mmol/L (ref 3.5–5.1)
Sodium: 138 mmol/L (ref 135–145)

## 2021-07-09 LAB — PREGNANCY, URINE: Preg Test, Ur: NEGATIVE

## 2021-07-09 MED ORDER — SODIUM CHLORIDE 0.9 % IV BOLUS
1000.0000 mL | Freq: Once | INTRAVENOUS | Status: AC
  Administered 2021-07-09: 1000 mL via INTRAVENOUS

## 2021-07-09 MED ORDER — IOHEXOL 300 MG/ML  SOLN
100.0000 mL | Freq: Once | INTRAMUSCULAR | Status: AC | PRN
  Administered 2021-07-09: 100 mL via INTRAVENOUS
  Filled 2021-07-09: qty 100

## 2021-07-09 MED ORDER — KETOROLAC TROMETHAMINE 15 MG/ML IJ SOLN
15.0000 mg | Freq: Once | INTRAMUSCULAR | Status: AC
  Administered 2021-07-09: 15 mg via INTRAVENOUS
  Filled 2021-07-09: qty 1

## 2021-07-09 MED ORDER — HYDROMORPHONE HCL 2 MG PO TABS: 1.0000 mg | ORAL_TABLET | ORAL | 0 refills | Status: AC | PRN

## 2021-07-09 MED ORDER — KETOROLAC TROMETHAMINE 10 MG PO TABS: 10.0000 mg | ORAL_TABLET | Freq: Four times a day (QID) | ORAL | 0 refills | Status: DC | PRN

## 2021-07-09 MED ORDER — TAMSULOSIN HCL 0.4 MG PO CAPS: 0.4000 mg | ORAL_CAPSULE | Freq: Every day | ORAL | 0 refills | Status: AC

## 2021-07-09 MED ORDER — PHENAZOPYRIDINE HCL 100 MG PO TABS: 100.0000 mg | ORAL_TABLET | Freq: Three times a day (TID) | ORAL | 0 refills | Status: AC | PRN

## 2021-07-09 NOTE — ED Triage Notes (Signed)
Pt here with right flank pain that started this weekend and has been consistent. Pt states pain starts in her back and radiates around her abd. Pt also c/o nausea. Pt stable in triage. ?

## 2021-07-09 NOTE — Discharge Instructions (Addendum)
Take toradol every 6 hours for pain control.  ?Take phenazopyridine every 8 hours for additional pain relief. ?Take hydromorphone 0.5 - 1.0 tablet every 4-6 hours as needed for additional severe pain.  ? ?

## 2021-07-09 NOTE — ED Provider Notes (Signed)
? ?[facility] ?Provider Note ? ? ? Event Date/Time  ? First MD Initiated Contact with Patient 07/09/21 1325   ?  (approximate) ? ? ?History  ? ?Flank Pain ? ? ?HPI ? ?Kayla Jimenez is a 46 y.o. female with no significant past medical history who comes ED complaining of right flank pain radiating to her right lower quadrant abdomen for the past 2 days, gradual onset, waxing waning, currently severe.  No aggravating or alleviating factors.  No fever.  She has nausea but no vomiting, no constipation or diarrhea.   ?  ? ? ?Physical Exam  ? ?Triage Vital Signs: ?ED Triage Vitals  ?Enc Vitals Group  ?   BP 07/09/21 1249 (!) 194/88  ?   Pulse Rate 07/09/21 1249 86  ?   Resp 07/09/21 1249 18  ?   Temp 07/09/21 1249 98.4 ?F (36.9 ?C)  ?   Temp Source 07/09/21 1249 Oral  ?   SpO2 07/09/21 1249 95 %  ?   Weight 07/09/21 1248 157 lb (71.2 kg)  ?   Height 07/09/21 1248 '5\' 3"'$  (1.6 m)  ?   Head Circumference --   ?   Peak Flow --   ?   Pain Score 07/09/21 1248 10  ?   Pain Loc --   ?   Pain Edu? --   ?   Excl. in [place]? --   ? ? ?Most recent vital signs: ?Vitals:  ? 07/09/21 1411 07/09/21 1422  ?BP: (!) 152/84   ?Pulse: 93 100  ?Resp: 18   ?Temp:    ?SpO2: 96% 96%  ? ? ? ?General: Awake, no distress.  ?CV:  Good peripheral perfusion.  Regular rate and rhythm ?Resp:  Normal effort.  Clear to auscultation bilaterally ?Abd:  No distention.  Soft with focal tenderness in the right lower quadrant.  No peritoneal signs ?Other:  Moist oral mucosa. ? ? ?ED Results / Procedures / Treatments  ? ?Labs ?(all labs ordered are listed, but only abnormal results are displayed) ?Labs Reviewed  ?URINALYSIS, ROUTINE W REFLEX MICROSCOPIC - Abnormal; Notable for the following components:  ?    Result Value  ? Color, Urine YELLOW (*)   ? APPearance HAZY (*)   ? Hgb urine dipstick LARGE (*)   ? Protein, ur 100 (*)   ? RBC / HPF >50 (*)   ? All other components within normal limits  ?BASIC METABOLIC PANEL - Abnormal; Notable for  the following components:  ? Glucose, Bld 104 (*)   ? Calcium 8.6 (*)   ? All other components within normal limits  ?CBC - Abnormal; Notable for the following components:  ? WBC 13.4 (*)   ? RDW 11.1 (*)   ? All other components within normal limits  ?URINE CULTURE  ?PREGNANCY, URINE  ? ? ? ?EKG ? ? ? ? ?RADIOLOGY ?CT abdomen pelvis viewed and interpreted by me, appendix appears normal.  There is a 6 mm stone in the proximal right ureter.  Radiology report reviewed, noting that there is some developing hydronephrosis but no frank obstruction given passage of contrast past the lesion.. ? ? ? ?PROCEDURES: ? ?Critical Care performed: No ? ?Procedures ? ? ?MEDICATIONS ORDERED IN ED: ?Medications  ?sodium chloride 0.9 % bolus 1,000 mL (1,000 mLs Intravenous New Bag/Given 07/09/21 1411)  ?ketorolac (TORADOL) 15 MG/ML injection 15 mg (15 mg Intravenous Given 07/09/21 1411)  ?iohexol (OMNIPAQUE) 300 MG/ML solution 100 mL (100 mLs Intravenous  Contrast Given 07/09/21 1429)  ? ? ? ?IMPRESSION / MDM / ASSESSMENT AND PLAN / ED COURSE  ?I reviewed the triage vital signs and the nursing notes. ?             ?               ? ?Differential diagnosis includes, but is not limited to, appendicitis, ureterolithiasis, cystitis/pyelonephritis, ovarian cyst ? ? ? ?Patient presents with right flank and right lower quadrant pain with tenderness.  Vital signs are normal.  Labs are essentially unremarkable, kidney function is normal, no signs of infection on the urinalysis.  There is large red blood cells in the urine and CT scan shows a 6 mm stone in the right proximal ureter as the source of her pain.  This is likely to be it will pass on its own, patient counseled on pain control strategies, follow-up with urology if symptoms not improving in the next few days.  Will start on Flomax as well. ?  ? ? ?FINAL CLINICAL IMPRESSION(S) / ED DIAGNOSES  ? ?Final diagnoses:  ?Ureterolithiasis  ? ? ? ?Rx / DC Orders  ? ?ED Discharge Orders   ? ?       Ordered  ?  ketorolac (TORADOL) 10 MG tablet  Every 6 hours PRN       ? 07/09/21 1524  ?  HYDROmorphone (DILAUDID) 2 MG tablet  Every 4 hours PRN       ? 07/09/21 1524  ?  phenazopyridine (PYRIDIUM) 100 MG tablet  3 times daily PRN       ? 07/09/21 1524  ?  tamsulosin (FLOMAX) 0.4 MG CAPS capsule  Daily       ? 07/09/21 1529  ? ?  ?  ? ?  ? ? ? ?Note:  This document was prepared using Dragon voice recognition software and may include unintentional dictation errors. ?  ?Carrie Mew, MD ?07/09/21 1530 ? ?

## 2021-07-11 LAB — URINE CULTURE: Culture: NO GROWTH

## 2021-07-15 ENCOUNTER — Encounter: Payer: Self-pay | Admitting: Nurse Practitioner

## 2021-07-15 ENCOUNTER — Ambulatory Visit (INDEPENDENT_AMBULATORY_CARE_PROVIDER_SITE_OTHER): Payer: BC Managed Care – PPO | Admitting: Nurse Practitioner

## 2021-07-15 VITALS — BP 140/90 | HR 88 | Temp 98.6°F | Resp 16 | Ht 63.0 in | Wt 158.2 lb

## 2021-07-15 DIAGNOSIS — F5101 Primary insomnia: Secondary | ICD-10-CM | POA: Diagnosis not present

## 2021-07-15 DIAGNOSIS — E782 Mixed hyperlipidemia: Secondary | ICD-10-CM

## 2021-07-15 DIAGNOSIS — F321 Major depressive disorder, single episode, moderate: Secondary | ICD-10-CM | POA: Diagnosis not present

## 2021-07-15 DIAGNOSIS — G43009 Migraine without aura, not intractable, without status migrainosus: Secondary | ICD-10-CM | POA: Diagnosis not present

## 2021-07-15 MED ORDER — SERTRALINE HCL 50 MG PO TABS: 75.0000 mg | ORAL_TABLET | Freq: Every day | ORAL | 2 refills | Status: DC

## 2021-07-15 MED ORDER — SUMATRIPTAN SUCCINATE 50 MG PO TABS: 50.0000 mg | ORAL_TABLET | ORAL | 0 refills | Status: DC | PRN

## 2021-07-15 MED ORDER — ESZOPICLONE 2 MG PO TABS: 2.0000 mg | ORAL_TABLET | Freq: Every evening | ORAL | 2 refills | Status: DC | PRN

## 2021-07-15 MED ORDER — ROSUVASTATIN CALCIUM 5 MG PO TABS: 5.0000 mg | ORAL_TABLET | Freq: Every day | ORAL | 2 refills | Status: DC

## 2021-07-15 MED ORDER — BUPROPION HCL ER (SR) 100 MG PO TB12: 100.0000 mg | ORAL_TABLET | Freq: Every day | ORAL | 1 refills | Status: DC

## 2021-07-15 NOTE — Progress Notes (Signed)
[facility] ?[address] ?[place], [place] [address] ? ?Internal MEDICINE  ?Office Visit Note ? ?Patient Name: Kayla Jimenez ? [id number]  ?[id number] ? ?Date of Service: 07/15/2021 ? ?Chief Complaint  ?Patient presents with  ? Follow-up  ?  Discuss meds, kidney stone   ? ? ?HPI ?Kayla Jimenez presents for a follow up visit for hyperlipidemia and starting a new medication. At her previous office visit, she was started on rosuvastatin for hyperlipidemia at 5 mg daily. She is tolerating this new medication well and denies any adverse side effects of the medication.  ?She is due for several refills of medications as well. ?She recently went to the ER for pain related to a kidney stone she is trying to pass from the right kidney. She has been working on passing this stone since October last year. She is following up with urology and they opted not to do the procedure to break up the kidney stone.  ? ? ? ? ? ?Current Medication: ?Outpatient Encounter Medications as of 07/15/2021  ?Medication Sig  ? Cranberry-Vitamin C (CRANBERRY CONCENTRATE/VITAMINC) 15000-100 MG CAPS Take by mouth.  ? ketorolac (TORADOL) 10 MG tablet Take 1 tablet (10 mg total) by mouth every 6 (six) hours as needed for moderate pain.  ? lidocaine (XYLOCAINE) 2 % solution Use as directed 10 mLs in the mouth or throat every 3 (three) hours as needed for mouth pain.  ? montelukast (SINGULAIR) 10 MG tablet TAKE 1 TABLET BY MOUTH EVERYDAY AT BEDTIME  ? phenazopyridine (PYRIDIUM) 100 MG tablet Take 1 tablet (100 mg total) by mouth 3 (three) times daily as needed for up to 7 days for pain.  ? Semaglutide (RYBELSUS) 3 MG TABS Take one tab po qd in am on empty stomach  ? tamsulosin (FLOMAX) 0.4 MG CAPS capsule Take 1 capsule (0.4 mg total) by mouth daily for 10 days. Discontinue after symptoms improve  ? Vitamin D, Ergocalciferol, (DRISDOL) 1.25 MG (50000 UNIT) CAPS capsule Take 1 capsule (50,000 Units total) by mouth every 7 (seven) days.  ? [DISCONTINUED]  buPROPion ER (WELLBUTRIN SR) 100 MG 12 hr tablet TAKE 1 TABLET BY MOUTH EVERY DAY  ? [DISCONTINUED] cyanocobalamin (,VITAMIN B-12,) 1000 MCG/ML injection once aweek for 3 weeks than once a month  ? [DISCONTINUED] eszopiclone (LUNESTA) 2 MG TABS tablet Take 1 tablet (2 mg total) by mouth at bedtime as needed for sleep. Take immediately before bedtime  ? [DISCONTINUED] INSULIN SYRINGE 1CC/29G (SAFETY INSULIN SYRINGES) 29G X 1/2" 1 ML MISC Use for b12 injection  ? [DISCONTINUED] predniSONE (STERAPRED UNI-PAK 21 TAB) 10 MG (21) TBPK tablet Take 6 pills on day one then decrease by 1 pill each day  ? [DISCONTINUED] rosuvastatin (CRESTOR) 5 MG tablet Take 1 tablet (5 mg total) by mouth daily.  ? [DISCONTINUED] sertraline (ZOLOFT) 50 MG tablet Take 1.5 tablets (75 mg total) by mouth at bedtime.  ? [DISCONTINUED] SUMAtriptan (IMITREX) 50 MG tablet Take 1 tablet (50 mg total) by mouth every 2 (two) hours as needed for migraine. May repeat in 2 hours if migraine persists or recurs.max 200 mg per 24 hours.  ? buPROPion ER (WELLBUTRIN SR) 100 MG 12 hr tablet Take 1 tablet (100 mg total) by mouth daily.  ? eszopiclone (LUNESTA) 2 MG TABS tablet Take 1 tablet (2 mg total) by mouth at bedtime as needed for sleep. Take immediately before bedtime  ? rosuvastatin (CRESTOR) 5 MG tablet Take 1 tablet (5 mg total) by mouth daily.  ?  sertraline (ZOLOFT) 50 MG tablet Take 1.5 tablets (75 mg total) by mouth at bedtime.  ? SUMAtriptan (IMITREX) 50 MG tablet Take 1 tablet (50 mg total) by mouth every 2 (two) hours as needed for migraine. May repeat in 2 hours if migraine persists or recurs.max 200 mg per 24 hours.  ? ?No facility-administered encounter medications on file as of 07/15/2021.  ? ? ?Surgical History: ?Past Surgical History:  ?Procedure Laterality Date  ? ABDOMINAL HYSTERECTOMY    ? ANTERIOR AND POSTERIOR REPAIR  03/16/2012  ? Procedure: ANTERIOR (CYSTOCELE) AND POSTERIOR REPAIR (RECTOCELE);  Surgeon: [name], MD;   Location: [facility];  Service: Gynecology;  Laterality: N/A;  anterior repair/cysto  ? BLADDER SUSPENSION  03/16/2012  ? Procedure: TRANSVAGINAL TAPE (TVT) PROCEDURE;  Surgeon: [name], MD;  Location: [facility];  Service: Gynecology;  Laterality: N/A;  ? BREAST REDUCTION SURGERY  6/04  ? CHOLECYSTECTOMY  06/28/14  ? CYSTOSCOPY  03/16/2012  ? Procedure: CYSTOSCOPY;  Surgeon: [name], MD;  Location: [facility];  Service: Gynecology;  Laterality: N/A;  ? CYSTOSCOPY  03/16/2017  ? Procedure: CYSTOSCOPY;  Surgeon: Everett Graff, MD;  Location: [facility];  Service: Gynecology;;  ? [place]  03/16/2012  ? Procedure: DILATATION & CURETTAGE/HYSTEROSCOPY WITH NOVASURE ABLATION;  Surgeon: [name], MD;  Location: [facility];  Service: Gynecology;  Laterality: N/A;  ? TONSILLECTOMY AND ADENOIDECTOMY    ? VAGINAL HYSTERECTOMY Bilateral 03/16/2017  ? Procedure: HYSTERECTOMY VAGINAL Bilateral Salpingectomy ;  Surgeon: Everett Graff, MD;  Location: [facility];  Service: Gynecology;  Laterality: Bilateral;  ? ? ?Medical History: ?Past Medical History:  ?Diagnosis Date  ? Anxiety 08/29/10  ? Bursitis   ? left foot  ? BV (bacterial vaginosis) 08/29/10  ? Complication of anesthesia 2004  ? vomited  ? Gross hematuria 07/08/2015  ? H/O bladder infections   ? H/O mumps   ? H/O toxoplasmosis   ? H/O varicella   ? History of kidney stones   ? Infections of kidney   ? several kidney stones, bladder infections  ? Kidney disease 09/03/2011  ? Left ureteral calculus with partial obstruction, passed 06/09/2014. CT did show perinephric stranding.   ? Menometrorrhagia 06/26/10  ? Plantar fasciitis   ? PMS (premenstrual syndrome) 07/10/11  ? Post - coital bleeding 08/29/10  ? Renal calculus, left   ? Sinus infection   ? UTI (lower urinary tract infection) 07/08/2015  ? Yeast infection   ? ? ?Family History: ?Family History  ?Problem Relation Age of Onset  ? Cancer Other   ?     breast  ? Cancer Mother    ?     cervical  ? Depression Mother   ? Drug abuse Mother   ? Anemia Mother   ?     blood transfusion  ? Migraines Mother   ? ? ?Social History  ? ?Socioeconomic History  ? Marital status: Married  ?  Spouse name: Not on file  ? Number of children: Not on file  ? Years of education: Not on file  ? Highest education level: Not on file  ?Occupational History  ? Not on file  ?Tobacco Use  ? Smoking status: Never  ? Smokeless tobacco: Never  ?Vaping Use  ? Vaping Use: Never used  ?Substance and Sexual Activity  ? Alcohol use: No  ? Drug use: No  ? Sexual activity: Yes  ?  Birth control/protection: Surgical  ?  Comment: pt spouse had vasec.   ?Other Topics Concern  ? Not on file  ?Social History Narrative  ? Not on file  ? ?Social Determinants of Health  ? ?Financial Resource Strain: Not on file  ?Food Insecurity: Not on file  ?Transportation Needs: Not on file  ?Physical Activity: Not on file  ?Stress: Not on file  ?Social Connections: Not on file  ?Intimate Partner Violence: Not on file  ? ? ? ? ?Review of Systems  ?Constitutional:  Negative for chills, fatigue and unexpected weight change.  ?HENT:  Negative for congestion, rhinorrhea, sneezing and sore throat.   ?Eyes:  Negative for redness.  ?Respiratory:  Negative for cough, chest tightness and shortness of breath.   ?Cardiovascular:  Negative for chest pain and palpitations.  ?Gastrointestinal:  Negative for abdominal pain, constipation, diarrhea, nausea and vomiting.  ?Genitourinary:  Positive for dysuria and flank pain. Negative for frequency.  ?     Kidney stones  ?Musculoskeletal:  Negative for arthralgias, back pain, joint swelling and neck pain.  ?Skin:  Negative for rash.  ?Neurological: Negative.  Negative for tremors and numbness.  ?Hematological:  Negative for adenopathy. Does not bruise/bleed easily.  ?Psychiatric/Behavioral:  Negative for behavioral problems (Depression), sleep disturbance and suicidal ideas. The patient is not nervous/anxious.    ? ?Vital Signs: ?BP 140/90   Pulse 88   Temp 98.6 ?F (37 ?C)   Resp 16   Ht '5\' 3"'$  (1.6 m)   Wt 158 lb 3.2 oz (71.8 kg)   LMP 02/23/2017 (Exact Date)   SpO2 97%   BMI 28.02 kg/m?  ? ? ?Physical Exam ?Vitals revi

## 2021-07-16 ENCOUNTER — Ambulatory Visit: Payer: BC Managed Care – PPO | Admitting: Physician Assistant

## 2021-07-16 ENCOUNTER — Other Ambulatory Visit: Payer: Self-pay | Admitting: Physician Assistant

## 2021-07-16 ENCOUNTER — Ambulatory Visit
Admission: RE | Admit: 2021-07-16 | Discharge: 2021-07-16 | Disposition: A | Discharge status: Home / Self Care | Payer: BC Managed Care – PPO | Attending: Physician Assistant | Admitting: Physician Assistant

## 2021-07-16 ENCOUNTER — Encounter: Payer: Self-pay | Admitting: Physician Assistant

## 2021-07-16 ENCOUNTER — Ambulatory Visit
Admission: RE | Admit: 2021-07-16 | Discharge: 2021-07-16 | Disposition: A | Discharge status: Home / Self Care | Payer: BC Managed Care – PPO | Source: Ambulatory Visit | Attending: Physician Assistant | Admitting: Physician Assistant

## 2021-07-16 VITALS — BP 140/88 | HR 84 | Ht 63.0 in | Wt 158.0 lb

## 2021-07-16 DIAGNOSIS — N201 Calculus of ureter: Secondary | ICD-10-CM | POA: Insufficient documentation

## 2021-07-16 LAB — URINALYSIS, COMPLETE
Bilirubin, UA: NEGATIVE
Glucose, UA: NEGATIVE
Ketones, UA: NEGATIVE
Leukocytes,UA: NEGATIVE
Nitrite, UA: NEGATIVE
Protein,UA: NEGATIVE
RBC, UA: NEGATIVE
Specific Gravity, UA: 1.01 (ref 1.005–1.030)
Urobilinogen, Ur: 0.2 mg/dL (ref 0.2–1.0)
pH, UA: 6.5 (ref 5.0–7.5)

## 2021-07-16 LAB — MICROSCOPIC EXAMINATION: Epithelial Cells (non renal): >10 /hpf — AB (ref 0–10)

## 2021-07-16 NOTE — H&P (View-Only) (Signed)
? ?07/16/2021 ?1:50 PM  ? ?Kayla Jimenez ?1975/07/04 ?[id number] ? ?CC: ?Chief Complaint  ?Patient presents with  ? Nephrolithiasis  ? ?HPI: ?Kayla Jimenez is a 46 y.o. female with PMH recurrent UTI and nephrolithiasis who presents today for ED follow-up of an acute stone episode.  ? ?She was seen in the emergency department 7 days ago with CT findings of a 6 mm right UPJ stone.  UA was notable for microscopic hematuria without pyuria, bacteriuria, or nitrites.  Urine culture was negative. ? ?Today she reports taking Flomax, ibuprofen, and hydromorphone but she has not yet seen the stone pass.  She believes she felt it shift distally this week.  She denies fever, chills, nausea, vomiting, and dysuria.  She has not taken any medication yet today. ? ?KUB today reveals interval migration of the 6 mm right UPJ stone to the distal right ureter. ? ?In-office UA today pan negative; urine microscopy with 6-10 WBCs/HPF, >10 epithelial cells/HPF, and moderate bacteria.  ? ?PMH: ?Past Medical History:  ?Diagnosis Date  ? Anxiety 08/29/10  ? Bursitis   ? left foot  ? BV (bacterial vaginosis) 08/29/10  ? Complication of anesthesia 2004  ? vomited  ? Gross hematuria 07/08/2015  ? H/O bladder infections   ? H/O mumps   ? H/O toxoplasmosis   ? H/O varicella   ? History of kidney stones   ? Infections of kidney   ? several kidney stones, bladder infections  ? Kidney disease 09/03/2011  ? Left ureteral calculus with partial obstruction, passed 06/09/2014. CT did show perinephric stranding.   ? Menometrorrhagia 06/26/10  ? Plantar fasciitis   ? PMS (premenstrual syndrome) 07/10/11  ? Post - coital bleeding 08/29/10  ? Renal calculus, left   ? Sinus infection   ? UTI (lower urinary tract infection) 07/08/2015  ? Yeast infection   ? ? ?Surgical History: ?Past Surgical History:  ?Procedure Laterality Date  ? ABDOMINAL HYSTERECTOMY    ? ANTERIOR AND POSTERIOR REPAIR  03/16/2012  ? Procedure: ANTERIOR (CYSTOCELE) AND POSTERIOR REPAIR (RECTOCELE);   Surgeon: [name], MD;  Location: [facility];  Service: Gynecology;  Laterality: N/A;  anterior repair/cysto  ? BLADDER SUSPENSION  03/16/2012  ? Procedure: TRANSVAGINAL TAPE (TVT) PROCEDURE;  Surgeon: [name], MD;  Location: [facility];  Service: Gynecology;  Laterality: N/A;  ? BREAST REDUCTION SURGERY  6/04  ? CHOLECYSTECTOMY  06/28/14  ? CYSTOSCOPY  03/16/2012  ? Procedure: CYSTOSCOPY;  Surgeon: [name], MD;  Location: [facility];  Service: Gynecology;  Laterality: N/A;  ? CYSTOSCOPY  03/16/2017  ? Procedure: CYSTOSCOPY;  Surgeon: Everett Graff, MD;  Location: [facility];  Service: Gynecology;;  ? [place]  03/16/2012  ? Procedure: DILATATION & CURETTAGE/HYSTEROSCOPY WITH NOVASURE ABLATION;  Surgeon: [name], MD;  Location: [facility];  Service: Gynecology;  Laterality: N/A;  ? TONSILLECTOMY AND ADENOIDECTOMY    ? VAGINAL HYSTERECTOMY Bilateral 03/16/2017  ? Procedure: HYSTERECTOMY VAGINAL Bilateral Salpingectomy ;  Surgeon: Everett Graff, MD;  Location: [facility];  Service: Gynecology;  Laterality: Bilateral;  ? ? ?Home Medications:  ?Allergies as of 07/16/2021   ? ?   Reactions  ? Other   ? Percocet [oxycodone-acetaminophen] Itching  ? Sulfa Antibiotics Itching  ? Sulfonamide Derivatives   ? REACTION: Eyes Itch  ? ?  ? ?  ?Medication List  ?  ? ?  ? Accurate as of July 16, 2021  1:50 PM. If you  have any questions, ask your nurse or doctor.  ?  ?  ? ?  ? ?buPROPion ER 100 MG 12 hr tablet ?Commonly known as: WELLBUTRIN SR ?Take 1 tablet (100 mg total) by mouth daily. ?  ?Cranberry Concentrate/VitaminC 15000-100 MG Caps ?Generic drug: Cranberry-Vitamin C ?Take by mouth. ?  ?eszopiclone 2 MG Tabs tablet ?Commonly known as: LUNESTA ?Take 1 tablet (2 mg total) by mouth at bedtime as needed for sleep. Take immediately before bedtime ?  ?ketorolac 10 MG tablet ?Commonly known as: TORADOL ?Take 1 tablet (10 mg total) by mouth every 6 (six) hours as  needed for moderate pain. ?  ?lidocaine 2 % solution ?Commonly known as: XYLOCAINE ?Use as directed 10 mLs in the mouth or throat every 3 (three) hours as needed for mouth pain. ?  ?montelukast 10 MG tablet ?Commonly known as: SINGULAIR ?TAKE 1 TABLET BY MOUTH EVERYDAY AT BEDTIME ?  ?phenazopyridine 100 MG tablet ?Commonly known as: PYRIDIUM ?Take 1 tablet (100 mg total) by mouth 3 (three) times daily as needed for up to 7 days for pain. ?  ?rosuvastatin 5 MG tablet ?Commonly known as: CRESTOR ?Take 1 tablet (5 mg total) by mouth daily. ?  ?Rybelsus 3 MG Tabs ?Generic drug: Semaglutide ?Take one tab po qd in am on empty stomach ?  ?sertraline 50 MG tablet ?Commonly known as: ZOLOFT ?Take 1.5 tablets (75 mg total) by mouth at bedtime. ?  ?SUMAtriptan 50 MG tablet ?Commonly known as: IMITREX ?Take 1 tablet (50 mg total) by mouth every 2 (two) hours as needed for migraine. May repeat in 2 hours if migraine persists or recurs.max 200 mg per 24 hours. ?  ?tamsulosin 0.4 MG Caps capsule ?Commonly known as: FLOMAX ?Take 1 capsule (0.4 mg total) by mouth daily for 10 days. Discontinue after symptoms improve ?  ?Vitamin D (Ergocalciferol) 1.25 MG (50000 UNIT) Caps capsule ?Commonly known as: DRISDOL ?Take 1 capsule (50,000 Units total) by mouth every 7 (seven) days. ?  ? ?  ? ? ?Allergies:  ?Allergies  ?Allergen Reactions  ? Other   ? Percocet [Oxycodone-Acetaminophen] Itching  ? Sulfa Antibiotics Itching  ? Sulfonamide Derivatives   ?  REACTION: Eyes Itch  ? ? ?Family History: ?Family History  ?Problem Relation Age of Onset  ? Cancer Other   ?     breast  ? Cancer Mother   ?     cervical  ? Depression Mother   ? Drug abuse Mother   ? Anemia Mother   ?     blood transfusion  ? Migraines Mother   ? ? ?Social History:  ? reports that she has never smoked. She has never used smokeless tobacco. She reports that she does not drink alcohol and does not use drugs. ? ?Physical Exam: ?BP 140/88   Pulse 84   Ht '5\' 3"'$  (1.6 m)   Wt  158 lb (71.7 kg)   LMP 02/23/2017 (Exact Date)   BMI 27.99 kg/m?   ?Constitutional:  Alert and oriented, no acute distress, nontoxic appearing ?HEENT: , AT ?Cardiovascular: No clubbing, cyanosis, or edema ?Respiratory: Normal respiratory effort, no increased work of breathing ?Skin: No rashes, bruises or suspicious lesions ?Neurologic: Grossly intact, no focal deficits, moving all 4 extremities ?Psychiatric: Normal mood and affect ? ?Laboratory Data: ?Results for orders placed or performed in visit on 07/16/21  ?Microscopic Examination  ? Urine  ?Result Value Ref Range  ? WBC, UA 6-10 (A) 0 - 5 /hpf  ? RBC  0-2 0 - 2 /hpf  ? Epithelial Cells (non renal) >10 (A) 0 - 10 /hpf  ? Bacteria, UA Moderate (A) None seen/Few  ?Urinalysis, Complete  ?Result Value Ref Range  ? Specific Gravity, UA 1.010 1.005 - 1.030  ? pH, UA 6.5 5.0 - 7.5  ? Color, UA Yellow Yellow  ? Appearance Ur Clear Clear  ? Leukocytes,UA Negative Negative  ? Protein,UA Negative Negative/Trace  ? Glucose, UA Negative Negative  ? Ketones, UA Negative Negative  ? RBC, UA Negative Negative  ? Bilirubin, UA Negative Negative  ? Urobilinogen, Ur 0.2 0.2 - 1.0 mg/dL  ? Nitrite, UA Negative Negative  ? Microscopic Examination See below:   ? ?Pertinent Imaging: ?KUB, 07/16/2021: ?CLINICAL DATA:  Proximal right ureteral stone ?  ?EXAM: ?ABDOMEN - 1 VIEW ?  ?COMPARISON:  CT from 07/09/2021 ?  ?FINDINGS: ?Scattered large and small bowel gas is noted. No obstructive changes ?are seen. There is a vague calcification to the right of the midline ?in the pelvis new from the prior exam. This may represent interval ?migration of the patient's previously seen proximal ureteral stone. ?No other definitive ureteral stone is seen. ?  ?IMPRESSION: ?Vague calcification in the right hemipelvis as described this may ?represent interval migration of the known ureteral stone. ?  ?  ?Electronically Signed ?  By: Inez Catalina M.D. ?  On: 07/17/2021 01:39 ? ?I personally reviewed  the images referenced above and note interval migration of the 6 mm stone to the distal right ureter. ? ?Assessment & Plan:   ?1. Right ureteral stone ?Migration of the 6 mm right ureteral stone to the distal ure

## 2021-07-16 NOTE — Progress Notes (Signed)
? ?07/16/2021 ?1:50 PM  ? ?Kayla Jimenez ?13-Oct-1975 ?[id number] ? ?CC: ?Chief Complaint  ?Patient presents with  ? Nephrolithiasis  ? ?HPI: ?Kayla Jimenez is a 46 y.o. female with PMH recurrent UTI and nephrolithiasis who presents today for ED follow-up of an acute stone episode.  ? ?She was seen in the emergency department 7 days ago with CT findings of a 6 mm right UPJ stone.  UA was notable for microscopic hematuria without pyuria, bacteriuria, or nitrites.  Urine culture was negative. ? ?Today she reports taking Flomax, ibuprofen, and hydromorphone but she has not yet seen the stone pass.  She believes she felt it shift distally this week.  She denies fever, chills, nausea, vomiting, and dysuria.  She has not taken any medication yet today. ? ?KUB today reveals interval migration of the 6 mm right UPJ stone to the distal right ureter. ? ?In-office UA today pan negative; urine microscopy with 6-10 WBCs/HPF, >10 epithelial cells/HPF, and moderate bacteria.  ? ?PMH: ?Past Medical History:  ?Diagnosis Date  ? Anxiety 08/29/10  ? Bursitis   ? left foot  ? BV (bacterial vaginosis) 08/29/10  ? Complication of anesthesia 2004  ? vomited  ? Gross hematuria 07/08/2015  ? H/O bladder infections   ? H/O mumps   ? H/O toxoplasmosis   ? H/O varicella   ? History of kidney stones   ? Infections of kidney   ? several kidney stones, bladder infections  ? Kidney disease 09/03/2011  ? Left ureteral calculus with partial obstruction, passed 06/09/2014. CT did show perinephric stranding.   ? Menometrorrhagia 06/26/10  ? Plantar fasciitis   ? PMS (premenstrual syndrome) 07/10/11  ? Post - coital bleeding 08/29/10  ? Renal calculus, left   ? Sinus infection   ? UTI (lower urinary tract infection) 07/08/2015  ? Yeast infection   ? ? ?Surgical History: ?Past Surgical History:  ?Procedure Laterality Date  ? ABDOMINAL HYSTERECTOMY    ? ANTERIOR AND POSTERIOR REPAIR  03/16/2012  ? Procedure: ANTERIOR (CYSTOCELE) AND POSTERIOR REPAIR (RECTOCELE);   Surgeon: [name], MD;  Location: [facility];  Service: Gynecology;  Laterality: N/A;  anterior repair/cysto  ? BLADDER SUSPENSION  03/16/2012  ? Procedure: TRANSVAGINAL TAPE (TVT) PROCEDURE;  Surgeon: [name], MD;  Location: [facility];  Service: Gynecology;  Laterality: N/A;  ? BREAST REDUCTION SURGERY  6/04  ? CHOLECYSTECTOMY  06/28/14  ? CYSTOSCOPY  03/16/2012  ? Procedure: CYSTOSCOPY;  Surgeon: [name], MD;  Location: [facility];  Service: Gynecology;  Laterality: N/A;  ? CYSTOSCOPY  03/16/2017  ? Procedure: CYSTOSCOPY;  Surgeon: Everett Graff, MD;  Location: [facility];  Service: Gynecology;;  ? [place]  03/16/2012  ? Procedure: DILATATION & CURETTAGE/HYSTEROSCOPY WITH NOVASURE ABLATION;  Surgeon: [name], MD;  Location: [facility];  Service: Gynecology;  Laterality: N/A;  ? TONSILLECTOMY AND ADENOIDECTOMY    ? VAGINAL HYSTERECTOMY Bilateral 03/16/2017  ? Procedure: HYSTERECTOMY VAGINAL Bilateral Salpingectomy ;  Surgeon: Everett Graff, MD;  Location: [facility];  Service: Gynecology;  Laterality: Bilateral;  ? ? ?Home Medications:  ?Allergies as of 07/16/2021   ? ?   Reactions  ? Other   ? Percocet [oxycodone-acetaminophen] Itching  ? Sulfa Antibiotics Itching  ? Sulfonamide Derivatives   ? REACTION: Eyes Itch  ? ?  ? ?  ?Medication List  ?  ? ?  ? Accurate as of July 16, 2021  1:50 PM. If you  have any questions, ask your nurse or doctor.  ?  ?  ? ?  ? ?buPROPion ER 100 MG 12 hr tablet ?Commonly known as: WELLBUTRIN SR ?Take 1 tablet (100 mg total) by mouth daily. ?  ?Cranberry Concentrate/VitaminC 15000-100 MG Caps ?Generic drug: Cranberry-Vitamin C ?Take by mouth. ?  ?eszopiclone 2 MG Tabs tablet ?Commonly known as: LUNESTA ?Take 1 tablet (2 mg total) by mouth at bedtime as needed for sleep. Take immediately before bedtime ?  ?ketorolac 10 MG tablet ?Commonly known as: TORADOL ?Take 1 tablet (10 mg total) by mouth every 6 (six) hours as  needed for moderate pain. ?  ?lidocaine 2 % solution ?Commonly known as: XYLOCAINE ?Use as directed 10 mLs in the mouth or throat every 3 (three) hours as needed for mouth pain. ?  ?montelukast 10 MG tablet ?Commonly known as: SINGULAIR ?TAKE 1 TABLET BY MOUTH EVERYDAY AT BEDTIME ?  ?phenazopyridine 100 MG tablet ?Commonly known as: PYRIDIUM ?Take 1 tablet (100 mg total) by mouth 3 (three) times daily as needed for up to 7 days for pain. ?  ?rosuvastatin 5 MG tablet ?Commonly known as: CRESTOR ?Take 1 tablet (5 mg total) by mouth daily. ?  ?Rybelsus 3 MG Tabs ?Generic drug: Semaglutide ?Take one tab po qd in am on empty stomach ?  ?sertraline 50 MG tablet ?Commonly known as: ZOLOFT ?Take 1.5 tablets (75 mg total) by mouth at bedtime. ?  ?SUMAtriptan 50 MG tablet ?Commonly known as: IMITREX ?Take 1 tablet (50 mg total) by mouth every 2 (two) hours as needed for migraine. May repeat in 2 hours if migraine persists or recurs.max 200 mg per 24 hours. ?  ?tamsulosin 0.4 MG Caps capsule ?Commonly known as: FLOMAX ?Take 1 capsule (0.4 mg total) by mouth daily for 10 days. Discontinue after symptoms improve ?  ?Vitamin D (Ergocalciferol) 1.25 MG (50000 UNIT) Caps capsule ?Commonly known as: DRISDOL ?Take 1 capsule (50,000 Units total) by mouth every 7 (seven) days. ?  ? ?  ? ? ?Allergies:  ?Allergies  ?Allergen Reactions  ? Other   ? Percocet [Oxycodone-Acetaminophen] Itching  ? Sulfa Antibiotics Itching  ? Sulfonamide Derivatives   ?  REACTION: Eyes Itch  ? ? ?Family History: ?Family History  ?Problem Relation Age of Onset  ? Cancer Other   ?     breast  ? Cancer Mother   ?     cervical  ? Depression Mother   ? Drug abuse Mother   ? Anemia Mother   ?     blood transfusion  ? Migraines Mother   ? ? ?Social History:  ? reports that she has never smoked. She has never used smokeless tobacco. She reports that she does not drink alcohol and does not use drugs. ? ?Physical Exam: ?BP 140/88   Pulse 84   Ht '5\' 3"'$  (1.6 m)   Wt  158 lb (71.7 kg)   LMP 02/23/2017 (Exact Date)   BMI 27.99 kg/m?   ?Constitutional:  Alert and oriented, no acute distress, nontoxic appearing ?HEENT: [place], AT ?Cardiovascular: No clubbing, cyanosis, or edema ?Respiratory: Normal respiratory effort, no increased work of breathing ?Skin: No rashes, bruises or suspicious lesions ?Neurologic: Grossly intact, no focal deficits, moving all 4 extremities ?Psychiatric: Normal mood and affect ? ?Laboratory Data: ?Results for orders placed or performed in visit on 07/16/21  ?Microscopic Examination  ? Urine  ?Result Value Ref Range  ? WBC, UA 6-10 (A) 0 - 5 /hpf  ? RBC  0-2 0 - 2 /hpf  ? Epithelial Cells (non renal) >10 (A) 0 - 10 /hpf  ? Bacteria, UA Moderate (A) None seen/Few  ?Urinalysis, Complete  ?Result Value Ref Range  ? Specific Gravity, UA 1.010 1.005 - 1.030  ? pH, UA 6.5 5.0 - 7.5  ? Color, UA Yellow Yellow  ? Appearance Ur Clear Clear  ? Leukocytes,UA Negative Negative  ? Protein,UA Negative Negative/Trace  ? Glucose, UA Negative Negative  ? Ketones, UA Negative Negative  ? RBC, UA Negative Negative  ? Bilirubin, UA Negative Negative  ? Urobilinogen, Ur 0.2 0.2 - 1.0 mg/dL  ? Nitrite, UA Negative Negative  ? Microscopic Examination See below:   ? ?Pertinent Imaging: ?KUB, 07/16/2021: ?CLINICAL DATA:  Proximal right ureteral stone ?  ?EXAM: ?ABDOMEN - 1 VIEW ?  ?COMPARISON:  CT from 07/09/2021 ?  ?FINDINGS: ?Scattered large and small bowel gas is noted. No obstructive changes ?are seen. There is a vague calcification to the right of the midline ?in the pelvis new from the prior exam. This may represent interval ?migration of the patient's previously seen proximal ureteral stone. ?No other definitive ureteral stone is seen. ?  ?IMPRESSION: ?Vague calcification in the right hemipelvis as described this may ?represent interval migration of the known ureteral stone. ?  ?  ?Electronically Signed ?  By: Inez Catalina M.D. ?  On: 07/17/2021 01:39 ? ?I personally reviewed  the images referenced above and note interval migration of the 6 mm stone to the distal right ureter. ? ?Assessment & Plan:   ?1. Right ureteral stone ?Migration of the 6 mm right ureteral stone to the distal ure

## 2021-07-16 NOTE — Progress Notes (Signed)
ES[facility] ORDER FORM ? ?Expected date of procedure: 07/18/2021 ? ?Surgeon: Nickolas Madrid, MD ? ?Post op standing: 2-4wk follow up w/KUB prior ? ?Anticoagulation/Aspirin/NSAID standing order: Hold all 72 hours prior ? ?Anesthesia standing order: MAC ? ?VTE standing: SCD's ? ?Dx: Right Ureteral Stone ? ?Procedure: right Extracorporeal shock wave lithotripsy ? ?CPT : [phone] ? ?Standing Order Set:  ? ?*NPO after mn, KUB ? ?*NS 171m/hr, Keflex 50108mPO, Benadryl 2573mO, Valium 52m52m, Zofran 4mg 46m? ? ? ?Medications if other than standing orders:   NONE ?  ?

## 2021-07-17 MED ORDER — DIPHENHYDRAMINE HCL 25 MG PO CAPS: 25.0000 mg | ORAL_CAPSULE | ORAL | Status: AC

## 2021-07-17 MED ORDER — ONDANSETRON HCL 4 MG/2ML IJ SOLN: 4.0000 mg | Freq: Once | INTRAMUSCULAR | Status: AC

## 2021-07-17 MED ORDER — SODIUM CHLORIDE 0.9 % IV SOLN: INTRAVENOUS | Status: DC

## 2021-07-17 MED ORDER — CEPHALEXIN 500 MG PO CAPS: 500.0000 mg | ORAL_CAPSULE | Freq: Once | ORAL | Status: AC

## 2021-07-17 MED ORDER — DIAZEPAM 5 MG PO TABS: 10.0000 mg | ORAL_TABLET | ORAL | Status: AC

## 2021-07-18 ENCOUNTER — Other Ambulatory Visit: Payer: Self-pay

## 2021-07-18 ENCOUNTER — Ambulatory Visit
Admission: RE | Admit: 2021-07-18 | Discharge: 2021-07-18 | Disposition: A | Discharge status: Home / Self Care | Payer: BC Managed Care – PPO | Source: Ambulatory Visit | Attending: Urology | Admitting: Urology

## 2021-07-18 ENCOUNTER — Encounter
Admission: RE | Disposition: A | Discharge status: Home / Self Care | Payer: Self-pay | Source: Ambulatory Visit | Attending: Urology

## 2021-07-18 ENCOUNTER — Ambulatory Visit: Payer: BC Managed Care – PPO

## 2021-07-18 ENCOUNTER — Encounter: Payer: Self-pay | Admitting: Urology

## 2021-07-18 DIAGNOSIS — N202 Calculus of kidney with calculus of ureter: Secondary | ICD-10-CM | POA: Insufficient documentation

## 2021-07-18 DIAGNOSIS — Z87442 Personal history of urinary calculi: Secondary | ICD-10-CM | POA: Insufficient documentation

## 2021-07-18 DIAGNOSIS — Z87440 Personal history of urinary (tract) infections: Secondary | ICD-10-CM | POA: Diagnosis not present

## 2021-07-18 DIAGNOSIS — Z79899 Other long term (current) drug therapy: Secondary | ICD-10-CM | POA: Insufficient documentation

## 2021-07-18 DIAGNOSIS — N201 Calculus of ureter: Secondary | ICD-10-CM

## 2021-07-18 HISTORY — PX: EXTRACORPOREAL SHOCK WAVE LITHOTRIPSY: SHX1557

## 2021-07-18 SURGERY — LITHOTRIPSY, ESWL
Anesthesia: Moderate Sedation | Laterality: Right

## 2021-07-18 MED ORDER — CEPHALEXIN 500 MG PO CAPS
ORAL_CAPSULE | ORAL | Status: AC
  Administered 2021-07-18: 500 mg via ORAL
  Filled 2021-07-18: qty 1

## 2021-07-18 MED ORDER — DIPHENHYDRAMINE HCL 25 MG PO CAPS
ORAL_CAPSULE | ORAL | Status: AC
  Administered 2021-07-18: 25 mg via ORAL
  Filled 2021-07-18: qty 1

## 2021-07-18 MED ORDER — ONDANSETRON HCL 4 MG/2ML IJ SOLN
INTRAMUSCULAR | Status: AC
  Administered 2021-07-18: 4 mg via INTRAVENOUS
  Filled 2021-07-18: qty 2

## 2021-07-18 MED ORDER — DIAZEPAM 5 MG PO TABS
ORAL_TABLET | ORAL | Status: AC
  Administered 2021-07-18: 10 mg via ORAL
  Filled 2021-07-18: qty 2

## 2021-07-18 NOTE — Brief Op Note (Signed)
07/18/2021 ? ?11:07 AM ? ?PATIENT:  Kayla Jimenez  46 y.o. female ? ?PRE-OPERATIVE DIAGNOSIS:  30m Right distal Ureteral Stone ? ?POST-OPERATIVE DIAGNOSIS:  Same ? ?PROCEDURE:  Procedure(s): ?EXTRACORPOREAL SHOCK WAVE LITHOTRIPSY (ESWL) (Right) ? ?SURGEON:  Surgeon(s) and Role: ?   * Liliana Dang, BHerbert Seta MD - Primary ? ?ANESTHESIA: Conscious Sedation ? ?EBL:  None ? ?Drains: None ? ?Specimen: None ? ?Findings:  ?Tolerated SWL well, stone appeared to smudge ? ?DISPO: ?Flomax, pain meds PRN, RTC 2 weeks KUB ? ?BNickolas Madrid MD ?07/18/2021 ? ? ? ? ? ?

## 2021-07-18 NOTE — Interval H&P Note (Signed)
UROLOGY H&P UPDATE ? ?Agree with prior H&P dated 07/16/21. 49m right stone appeared to have migrated distally on KUB. ? ?Cardiac: RRR ?Lungs: CTA bilaterally ? ?Laterality: RIGHT ?Procedure: Shockwave lithotripsy ? ?Informed consent obtained, we specifically discussed the risks of bleeding, infection, post-operative pain, obstructive fragments, steinstrasse, need for additional procedures. ? ?BBilley Co MD ?07/18/2021 ? ? ? ?

## 2021-07-18 NOTE — Discharge Instructions (Signed)

## 2021-07-20 LAB — CULTURE, URINE COMPREHENSIVE

## 2021-07-22 ENCOUNTER — Encounter: Payer: Self-pay | Admitting: Urology

## 2021-08-08 ENCOUNTER — Encounter: Payer: Self-pay | Admitting: Physician Assistant

## 2021-08-08 ENCOUNTER — Ambulatory Visit: Payer: BC Managed Care – PPO | Admitting: Physician Assistant

## 2021-08-08 ENCOUNTER — Ambulatory Visit
Admission: RE | Admit: 2021-08-08 | Discharge: 2021-08-08 | Disposition: A | Discharge status: Home / Self Care | Payer: BC Managed Care – PPO | Attending: Physician Assistant | Admitting: Physician Assistant

## 2021-08-08 ENCOUNTER — Ambulatory Visit
Admission: RE | Admit: 2021-08-08 | Discharge: 2021-08-08 | Disposition: A | Discharge status: Home / Self Care | Payer: BC Managed Care – PPO | Source: Ambulatory Visit | Attending: Physician Assistant | Admitting: Physician Assistant

## 2021-08-08 VITALS — BP 163/96 | HR 90 | Ht 63.0 in | Wt 158.0 lb

## 2021-08-08 DIAGNOSIS — N201 Calculus of ureter: Secondary | ICD-10-CM | POA: Insufficient documentation

## 2021-08-08 DIAGNOSIS — Z87442 Personal history of urinary calculi: Secondary | ICD-10-CM | POA: Diagnosis not present

## 2021-08-08 LAB — MICROSCOPIC EXAMINATION

## 2021-08-08 LAB — URINALYSIS, COMPLETE
Bilirubin, UA: NEGATIVE
Glucose, UA: NEGATIVE
Ketones, UA: NEGATIVE
Leukocytes,UA: NEGATIVE
Nitrite, UA: NEGATIVE
Protein,UA: NEGATIVE
RBC, UA: NEGATIVE
Specific Gravity, UA: <1.005 — ABNORMAL LOW (ref 1.005–1.030)
Urobilinogen, Ur: 0.2 mg/dL (ref 0.2–1.0)
pH, UA: 6 (ref 5.0–7.5)

## 2021-08-08 NOTE — Progress Notes (Signed)
? ?08/08/2021 ?4:12 PM  ? ?ERI MCEVERS ?11/23/1975 ?[id number] ? ?CC: ?Chief Complaint  ?Patient presents with  ? Nephrolithiasis  ? ?HPI: ?Kayla Jimenez is a 46 y.o. female with PMH recurrent UTI and nephrolithiasis who underwent ESWL with Dr. Diamantina Providence on 07/18/2021 for management of a 6 mm distal right ureteral stone who presents today for postop follow-up.  Stone appeared to smudge during the procedure. ? ?Today she reports she passed several stone fragments and sand in the 3 to 4 days following her ESWL.  Her pain resolved several days after that.  Today she feels well.  She bring stone fragments with her for analysis. ? ?KUB today with interval clearance of the distal right ureteral stone. ? ?She reports that she used to drink soda nearly exclusively but has cut back significantly.  She still has an occasional soda. ? ?In-office UA today pan negative; urine microscopy with moderate bacteria.  ? ?PMH: ?Past Medical History:  ?Diagnosis Date  ? Anxiety 08/29/10  ? Bursitis   ? left foot  ? BV (bacterial vaginosis) 08/29/10  ? Complication of anesthesia 2004  ? vomited  ? Gross hematuria 07/08/2015  ? H/O bladder infections   ? H/O mumps   ? H/O toxoplasmosis   ? H/O varicella   ? History of kidney stones   ? Infections of kidney   ? several kidney stones, bladder infections  ? Kidney disease 09/03/2011  ? Left ureteral calculus with partial obstruction, passed 06/09/2014. CT did show perinephric stranding.   ? Menometrorrhagia 06/26/10  ? Plantar fasciitis   ? PMS (premenstrual syndrome) 07/10/11  ? Post - coital bleeding 08/29/10  ? Renal calculus, left   ? Sinus infection   ? UTI (lower urinary tract infection) 07/08/2015  ? Yeast infection   ? ? ?Surgical History: ?Past Surgical History:  ?Procedure Laterality Date  ? ABDOMINAL HYSTERECTOMY    ? ANTERIOR AND POSTERIOR REPAIR  03/16/2012  ? Procedure: ANTERIOR (CYSTOCELE) AND POSTERIOR REPAIR (RECTOCELE);  Surgeon: [name], MD;  Location: [facility];  Service:  Gynecology;  Laterality: N/A;  anterior repair/cysto  ? BLADDER SUSPENSION  03/16/2012  ? Procedure: TRANSVAGINAL TAPE (TVT) PROCEDURE;  Surgeon: [name], MD;  Location: [facility];  Service: Gynecology;  Laterality: N/A;  ? BREAST REDUCTION SURGERY  6/04  ? CHOLECYSTECTOMY  06/28/14  ? CYSTOSCOPY  03/16/2012  ? Procedure: CYSTOSCOPY;  Surgeon: [name], MD;  Location: [facility];  Service: Gynecology;  Laterality: N/A;  ? CYSTOSCOPY  03/16/2017  ? Procedure: CYSTOSCOPY;  Surgeon: Everett Graff, MD;  Location: [facility];  Service: Gynecology;;  ? [place]  03/16/2012  ? Procedure: DILATATION & CURETTAGE/HYSTEROSCOPY WITH NOVASURE ABLATION;  Surgeon: [name], MD;  Location: [facility];  Service: Gynecology;  Laterality: N/A;  ? EXTRACORPOREAL SHOCK WAVE LITHOTRIPSY Right 07/18/2021  ? Procedure: EXTRACORPOREAL SHOCK WAVE LITHOTRIPSY (ESWL);  Surgeon: Billey Co, MD;  Location: [facility];  Service: Urology;  Laterality: Right;  ? TONSILLECTOMY AND ADENOIDECTOMY    ? VAGINAL HYSTERECTOMY Bilateral 03/16/2017  ? Procedure: HYSTERECTOMY VAGINAL Bilateral Salpingectomy ;  Surgeon: Everett Graff, MD;  Location: [facility];  Service: Gynecology;  Laterality: Bilateral;  ? ? ?Home Medications:  ?Allergies as of 08/08/2021   ? ?   Reactions  ? Other   ? Percocet [oxycodone-acetaminophen] Itching  ? Sulfa Antibiotics Itching  ? Sulfonamide Derivatives   ? REACTION: Eyes Itch  ? ?  ? ?  ?  Medication List  ?  ? ?  ? Accurate as of Aug 08, 2021  4:12 PM. If you have any questions, ask your nurse or doctor.  ?  ?  ? ?  ? ?STOP taking these medications   ? ?ketorolac 10 MG tablet ?Commonly known as: TORADOL ?Stopped by: Debroah Loop, PA-C ?  ?lidocaine 2 % solution ?Commonly known as: XYLOCAINE ?Stopped by: Debroah Loop, PA-C ?  ?Vitamin D (Ergocalciferol) 1.25 MG (50000 UNIT) Caps capsule ?Commonly known as: DRISDOL ?Stopped by: Debroah Loop,  PA-C ?  ? ?  ? ?TAKE these medications   ? ?buPROPion ER 100 MG 12 hr tablet ?Commonly known as: WELLBUTRIN SR ?Take 1 tablet (100 mg total) by mouth daily. ?  ?Cranberry Concentrate/VitaminC 15000-100 MG Caps ?Generic drug: Cranberry-Vitamin C ?Take by mouth. ?  ?eszopiclone 2 MG Tabs tablet ?Commonly known as: LUNESTA ?Take 1 tablet (2 mg total) by mouth at bedtime as needed for sleep. Take immediately before bedtime ?  ?montelukast 10 MG tablet ?Commonly known as: SINGULAIR ?TAKE 1 TABLET BY MOUTH EVERYDAY AT BEDTIME ?  ?rosuvastatin 5 MG tablet ?Commonly known as: CRESTOR ?Take 1 tablet (5 mg total) by mouth daily. ?  ?Rybelsus 3 MG Tabs ?Generic drug: Semaglutide ?Take one tab po qd in am on empty stomach ?  ?sertraline 50 MG tablet ?Commonly known as: ZOLOFT ?Take 1.5 tablets (75 mg total) by mouth at bedtime. ?  ?SUMAtriptan 50 MG tablet ?Commonly known as: IMITREX ?Take 1 tablet (50 mg total) by mouth every 2 (two) hours as needed for migraine. May repeat in 2 hours if migraine persists or recurs.max 200 mg per 24 hours. ?  ? ?  ? ? ?Allergies:  ?Allergies  ?Allergen Reactions  ? Other   ? Percocet [Oxycodone-Acetaminophen] Itching  ? Sulfa Antibiotics Itching  ? Sulfonamide Derivatives   ?  REACTION: Eyes Itch  ? ? ?Family History: ?Family History  ?Problem Relation Age of Onset  ? Cancer Other   ?     breast  ? Cancer Mother   ?     cervical  ? Depression Mother   ? Drug abuse Mother   ? Anemia Mother   ?     blood transfusion  ? Migraines Mother   ? ? ?Social History:  ? reports that she has never smoked. She has never used smokeless tobacco. She reports that she does not drink alcohol and does not use drugs. ? ?Physical Exam: ?BP (!) 163/96   Pulse 90   Ht '5\' 3"'$  (1.6 m)   Wt 158 lb (71.7 kg)   LMP 02/23/2017 (Exact Date)   BMI 27.99 kg/m?   ?Constitutional:  Alert and oriented, no acute distress, nontoxic appearing ?HEENT: [place], AT ?Cardiovascular: No clubbing, cyanosis, or edema ?Respiratory: Normal  respiratory effort, no increased work of breathing ?Skin: No rashes, bruises or suspicious lesions ?Neurologic: Grossly intact, no focal deficits, moving all 4 extremities ?Psychiatric: Normal mood and affect ? ?Laboratory Data: ?Results for orders placed or performed in visit on 07/16/21  ?CULTURE, URINE COMPREHENSIVE  ? Specimen: Urine  ? UR  ?Result Value Ref Range  ? Urine Culture, Comprehensive Final report   ? Organism ID, Bacteria Comment   ?Microscopic Examination  ? Urine  ?Result Value Ref Range  ? WBC, UA 6-10 (A) 0 - 5 /hpf  ? RBC 0-2 0 - 2 /hpf  ? Epithelial Cells (non renal) >10 (A) 0 - 10 /hpf  ? Bacteria, UA Moderate (  A) None seen/Few  ?Urinalysis, Complete  ?Result Value Ref Range  ? Specific Gravity, UA 1.010 1.005 - 1.030  ? pH, UA 6.5 5.0 - 7.5  ? Color, UA Yellow Yellow  ? Appearance Ur Clear Clear  ? Leukocytes,UA Negative Negative  ? Protein,UA Negative Negative/Trace  ? Glucose, UA Negative Negative  ? Ketones, UA Negative Negative  ? RBC, UA Negative Negative  ? Bilirubin, UA Negative Negative  ? Urobilinogen, Ur 0.2 0.2 - 1.0 mg/dL  ? Nitrite, UA Negative Negative  ? Microscopic Examination See below:   ? ?Pertinent Imaging: ?KUB, 08/08/2021: ?CLINICAL DATA:  Status post ESWL for right ureteral stone on April ?13, 2023 ?  ?EXAM: ?ABDOMEN - 1 VIEW ?  ?COMPARISON:  July 18, 2021 ?  ?FINDINGS: ?The bowel gas pattern is normal. Previously questioned 5 mm proximal ?right ureteral calculus is identified unchanged. Previously noted ?calcific densities in the right pelvis are unchanged. ?  ?IMPRESSION: ?Previously questioned 5 mm proximal right ureteral calculus is ?identified unchanged. Previously noted calcific densities in the ?right pelvis are unchanged. ?  ?  ?Electronically Signed ?  By: Abelardo Diesel M.D. ?  On: 08/09/2021 11:00 ? ?I personally reviewed the images referenced above and note interval clearance of the distal right ureteral stone. ? ?Assessment & Plan:   ?1. Right ureteral  stone ?Interval clearance of the stone on KUB, bland UA, numerous fragments passed, and symptoms resolved all consistent with successful ESWL.  We will send fragments for analysis today and reach out to her via [facility]

## 2021-08-15 LAB — CALCULI, WITH PHOTOGRAPH (CLINICAL LAB)
Calcium Oxalate Dihydrate: 50 %
Calcium Oxalate Monohydrate: 50 %
Weight Calculi: 56 mg

## 2021-09-15 ENCOUNTER — Ambulatory Visit
Admission: EM | Admit: 2021-09-15 | Discharge: 2021-09-15 | Disposition: A | Discharge status: Home / Self Care | Payer: BC Managed Care – PPO | Attending: Nurse Practitioner | Admitting: Nurse Practitioner

## 2021-09-15 ENCOUNTER — Encounter: Payer: Self-pay | Admitting: Emergency Medicine

## 2021-09-15 DIAGNOSIS — J069 Acute upper respiratory infection, unspecified: Secondary | ICD-10-CM | POA: Insufficient documentation

## 2021-09-15 DIAGNOSIS — J029 Acute pharyngitis, unspecified: Secondary | ICD-10-CM | POA: Insufficient documentation

## 2021-09-15 LAB — POCT RAPID STREP A (OFFICE): Rapid Strep A Screen: NEGATIVE

## 2021-09-15 MED ORDER — CETIRIZINE HCL 10 MG PO TABS: 10.0000 mg | ORAL_TABLET | Freq: Every day | ORAL | 0 refills | Status: DC

## 2021-09-15 MED ORDER — FLUTICASONE PROPIONATE 50 MCG/ACT NA SUSP: 2.0000 | Freq: Every day | NASAL | 0 refills | Status: DC

## 2021-09-15 MED ORDER — PROMETHAZINE-DM 6.25-15 MG/5ML PO SYRP: 5.0000 mL | ORAL_SOLUTION | Freq: Four times a day (QID) | ORAL | 0 refills | Status: DC | PRN

## 2021-09-15 NOTE — Discharge Instructions (Addendum)
The rapid strep test is negative today.  Throat culture and COVID/flu test are pending.  If the results are positive, you will be contacted.  If you have access to MyChart, you will also be able to see the results there. Take medication as prescribed. Increase fluids and allow for plenty of rest. Recommend Tylenol or ibuprofen as needed for pain, fever, or general discomfort. Warm salt water gargles 3-4 times daily to help with throat pain or discomfort. Recommend a soft diet until symptoms improve.  May also benefit from use of warm tea with honey. Recommend using a humidifier at bedtime during sleep to help with cough and nasal congestion. Sleep elevated on 2 pillows. Follow-up if your symptoms do not improve or if they suddenly worsen.Marland Kitchen

## 2021-09-15 NOTE — ED Triage Notes (Signed)
Sore throat that stared on Wednesday with a cough.  States she is coughing up yellow sputum.  States both ears hurt.

## 2021-09-15 NOTE — ED Provider Notes (Signed)
[facility]    CSN: [id number] Arrival date & time: 09/15/21  1607      History   Chief Complaint No chief complaint on file.   HPI Kayla Jimenez is a 46 y.o. female.   HPI  Patient presents for complaints of cough sore throat and cough.  Symptoms have been present for the past 4 days.  She states that she has also had some nasal congestion.  Patient is now coughing up yellow sputum and she complains of bilateral ear pressure.  She denies fever, chills, headache, wheezing, shortness of breath, difficulty breathing, or GI symptoms.  Patient has been taking an old prescription of Tessalon Perles, Sudafed, and ibuprofen.  She also takes Singulair at bedtime for her allergies.  Past Medical History:  Diagnosis Date   Anxiety 08/29/10   Bursitis    left foot   BV (bacterial vaginosis) 3/71/06   Complication of anesthesia 2004   vomited   Gross hematuria 07/08/2015   H/O bladder infections    H/O mumps    H/O toxoplasmosis    H/O varicella    History of kidney stones    Infections of kidney    several kidney stones, bladder infections   Kidney disease 09/03/2011   Left ureteral calculus with partial obstruction, passed 06/09/2014. CT did show perinephric stranding.    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   Loss of hair 10/07/2018   Body mass index (BMI) of 25.0 to 29.9 10/07/2018   Hematuria 06/28/2018   Right flank pain 06/28/2018   H/O: hysterectomy 11/19/2017   Dysuria  10/20/2017   Allergic rhinitis due to pollen 08/12/2017   Attention and concentration deficit 08/12/2017   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   Primary insomnia 08/12/2017   Irregular bleeding 03/16/2017   Plantar fasciitis 03/10/2017   SUI (stress urinary incontinence, female) 08/14/2015   Cystocele, midline 08/14/2015   Renal calculi 07/26/2015   Urinary tract infection with hematuria 07/08/2015   Gross hematuria 07/08/2015   History of renal stone 07/08/2015   Abdominal bloating 07/10/2014   Gallstones 06/13/2014   Yeast infection 09/03/2011   H/O varicella 09/03/2011   H/O mumps 09/03/2011   Post - coital bleeding 09/03/2011   Anxiety 09/03/2011   Premenstrual tension syndrome 09/03/2011   H/O bladder infections 09/03/2011   Kidney disease 09/03/2011   Menorrhagia 07/10/2011    Past Surgical History:  Procedure Laterality Date   ABDOMINAL HYSTERECTOMY     ANTERIOR AND POSTERIOR REPAIR  03/16/2012   Procedure: ANTERIOR (CYSTOCELE) AND POSTERIOR REPAIR (RECTOCELE);  Surgeon: [name], MD;  Location: [facility];  Service: Gynecology;  Laterality: N/A;  anterior repair/cysto   BLADDER SUSPENSION  03/16/2012   Procedure: TRANSVAGINAL TAPE (TVT) PROCEDURE;  Surgeon: [name], MD;  Location: [facility];  Service: Gynecology;  Laterality: N/A;   BREAST REDUCTION SURGERY  6/04   CHOLECYSTECTOMY  06/28/14   CYSTOSCOPY  03/16/2012   Procedure: CYSTOSCOPY;  Surgeon:  [name], MD;  Location: [facility];  Service: Gynecology;  Laterality: N/A;   CYSTOSCOPY  03/16/2017   Procedure: CYSTOSCOPY;  Surgeon: Everett Graff, MD;  Location: [facility];  Service: Gynecology;;   [place]  03/16/2012   Procedure: DILATATION & CURETTAGE/HYSTEROSCOPY WITH NOVASURE ABLATION;  Surgeon: [name], MD;  Location: [facility];  Service: Gynecology;  Laterality: N/A;   EXTRACORPOREAL SHOCK WAVE LITHOTRIPSY Right 07/18/2021   Procedure:  EXTRACORPOREAL SHOCK WAVE LITHOTRIPSY (ESWL);  Surgeon: Billey Co, MD;  Location: [facility];  Service: Urology;  Laterality: Right;   TONSILLECTOMY AND ADENOIDECTOMY     VAGINAL HYSTERECTOMY Bilateral 03/16/2017   Procedure: HYSTERECTOMY VAGINAL Bilateral Salpingectomy ;  Surgeon: Everett Graff, MD;  Location: [facility];  Service: Gynecology;  Laterality: Bilateral;    OB History     Gravida  2   Para  2   Term  2   Preterm      AB      Living  2      SAB      IAB      Ectopic      Multiple      Live Births  2            Home Medications    Prior to Admission medications   Medication Sig Start Date End Date Taking? Authorizing Provider  buPROPion ER (WELLBUTRIN SR) 100 MG 12 hr tablet Take 1 tablet (100 mg total) by mouth daily. 07/15/21   Jonetta Osgood, NP  Cranberry-Vitamin C (CRANBERRY CONCENTRATE/VITAMINC) 15000-100 MG CAPS Take by mouth.    [provider]  eszopiclone (LUNESTA) 2 MG TABS tablet Take 1 tablet (2 mg total) by mouth at bedtime as needed for sleep. Take immediately before bedtime 07/15/21   Jonetta Osgood, NP  montelukast (SINGULAIR) 10 MG tablet TAKE 1 TABLET BY MOUTH EVERYDAY AT BEDTIME 05/27/21   Jonetta Osgood, NP  rosuvastatin (CRESTOR) 5 MG tablet Take 1 tablet (5 mg total) by mouth daily. 07/15/21   Jonetta Osgood, NP  Semaglutide (RYBELSUS) 3 MG TABS Take one tab po qd in am on empty stomach 05/27/21   Jonetta Osgood, NP  sertraline (ZOLOFT) 50 MG tablet Take 1.5 tablets (75 mg total) by mouth at bedtime. 07/15/21   Jonetta Osgood, NP  SUMAtriptan (IMITREX) 50 MG tablet Take 1 tablet (50 mg total) by mouth every 2 (two) hours as needed for migraine. May repeat in 2 hours if migraine persists or recurs.max 200 mg per 24 hours. 07/15/21   Jonetta Osgood, NP    Family History Family History  Problem Relation Age of Onset   Cancer Other        breast   Cancer Mother        cervical   Depression Mother     Drug abuse Mother    Anemia Mother        blood transfusion   Migraines Mother     Social History Social History   Tobacco Use   Smoking status: Never   Smokeless tobacco: Never  Vaping Use   Vaping Use: Never used  Substance Use Topics   Alcohol use: No   Drug use: No     Allergies   Other, Percocet [oxycodone-acetaminophen], Sulfa antibiotics, and Sulfonamide derivatives   Review of Systems Review of Systems Per HPI  Physical Exam Triage Vital Signs ED Triage Vitals [09/15/21 0820]  Enc Vitals Group     BP Marland Kitchen)  156/95     Pulse Rate 94     Resp 18     Temp 98.3 F (36.8 C)     Temp Source Oral     SpO2 96 %     Weight      Height      Head Circumference      Peak Flow      Pain Score 7     Pain Loc      Pain Edu?      Excl. in [place]?    No data found.  Updated Vital Signs BP (!) 156/95 (BP Location: Right Arm)   Pulse 94   Temp 98.3 F (36.8 C) (Oral)   Resp 18   LMP 02/23/2017 (Exact Date)   SpO2 96%   Visual Acuity Right Eye Distance:   Left Eye Distance:   Bilateral Distance:    Right Eye Near:   Left Eye Near:    Bilateral Near:     Physical Exam Vitals and nursing note reviewed.  Constitutional:      General: She is not in acute distress.    Appearance: Normal appearance. She is well-developed.  HENT:     Head: Normocephalic and atraumatic.     Right Ear: Tympanic membrane, ear canal and external ear normal.     Left Ear: Tympanic membrane, ear canal and external ear normal.     Nose: Congestion present.     Right Turbinates: Enlarged and swollen.     Left Turbinates: Enlarged and swollen.     Right Sinus: No maxillary sinus tenderness or frontal sinus tenderness.     Left Sinus: No maxillary sinus tenderness or frontal sinus tenderness.     Mouth/Throat:     Mouth: Mucous membranes are moist.     Pharynx: Uvula midline. Pharyngeal swelling and posterior oropharyngeal erythema present. No oropharyngeal exudate or uvula swelling.      Tonsils: 1+ on the right. 1+ on the left.  Eyes:     Conjunctiva/sclera: Conjunctivae normal.     Pupils: Pupils are equal, round, and reactive to light.  Neck:     Thyroid: No thyromegaly.     Trachea: No tracheal deviation.  Cardiovascular:     Rate and Rhythm: Normal rate and regular rhythm.     Pulses: Normal pulses.     Heart sounds: Normal heart sounds.  Pulmonary:     Effort: Pulmonary effort is normal.     Breath sounds: Normal breath sounds.  Abdominal:     General: Bowel sounds are normal. There is no distension.     Palpations: Abdomen is soft.     Tenderness: There is no abdominal tenderness.  Musculoskeletal:     Cervical back: Normal range of motion and neck supple.  Skin:    General: Skin is warm and dry.  Neurological:     General: No focal deficit present.     Mental Status: She is alert and oriented to person, place, and time.  Psychiatric:        Behavior: Behavior normal.        Thought Content: Thought content normal.        Judgment: Judgment normal.      UC Treatments / Results  Labs (all labs ordered are listed, but only abnormal results are displayed) Labs Reviewed  CULTURE, GROUP A STREP ([place])  COVID-19, FLU A+B NAA  POCT RAPID STREP A (OFFICE)    EKG   Radiology No results found.  Procedures Procedures (including critical care time)  Medications Ordered in UC Medications - No data to display  Initial Impression / Assessment and Plan / UC Course  I have reviewed the triage vital signs and the nursing notes.  Pertinent labs & imaging results that were available during my care of the patient were reviewed by me and considered in my medical decision making (see chart for details).  Patient presents with upper respiratory symptoms that have been present for the past 4 days.  Rapid strep test is negative, throat culture and  COVID/flu tests are pending.  Will provide symptomatic treatment with Promethazine DM, fluticasone, and  cetirizine.  Supportive care recommendations were provided for the patient.  Return precautions were discussed with the patient. Final Clinical Impressions(s) / UC Diagnoses   Final diagnoses:  Sore throat  Acute upper respiratory infection     Discharge Instructions      The rapid strep test is negative today.  Throat culture and COVID/flu test are pending.  If the results are positive, you will be contacted.  If you have access to MyChart, you will also be able to see the results there. Take medication as prescribed. Increase fluids and allow for plenty of rest. Recommend Tylenol or ibuprofen as needed for pain, fever, or general discomfort. Warm salt water gargles 3-4 times daily to help with throat pain or discomfort. Recommend a soft diet until symptoms improve.  May also benefit from use of warm tea with honey. Recommend using a humidifier at bedtime during sleep to help with cough and nasal congestion. Sleep elevated on 2 pillows. Follow-up if your symptoms do not improve or if they suddenly worsen..      ED Prescriptions   None    PDMP not reviewed this encounter.   Tish Men, NP 09/15/21 [phone]

## 2021-09-17 LAB — CULTURE, GROUP A STREP (THRC)

## 2021-09-17 LAB — COVID-19, FLU A+B NAA
Influenza A, NAA: NOT DETECTED
Influenza B, NAA: NOT DETECTED
SARS-CoV-2, NAA: NOT DETECTED

## 2021-10-02 ENCOUNTER — Encounter: Payer: Self-pay | Admitting: Nurse Practitioner

## 2021-10-02 ENCOUNTER — Telehealth (INDEPENDENT_AMBULATORY_CARE_PROVIDER_SITE_OTHER): Payer: BC Managed Care – PPO | Admitting: Nurse Practitioner

## 2021-10-02 VITALS — Ht 63.0 in | Wt 160.0 lb

## 2021-10-02 DIAGNOSIS — N951 Menopausal and female climacteric states: Secondary | ICD-10-CM | POA: Diagnosis not present

## 2021-10-02 DIAGNOSIS — F5101 Primary insomnia: Secondary | ICD-10-CM

## 2021-10-02 DIAGNOSIS — R5383 Other fatigue: Secondary | ICD-10-CM | POA: Diagnosis not present

## 2021-10-02 DIAGNOSIS — J0141 Acute recurrent pansinusitis: Secondary | ICD-10-CM

## 2021-10-02 DIAGNOSIS — E538 Deficiency of other specified B group vitamins: Secondary | ICD-10-CM

## 2021-10-02 DIAGNOSIS — R051 Acute cough: Secondary | ICD-10-CM | POA: Diagnosis not present

## 2021-10-02 DIAGNOSIS — E559 Vitamin D deficiency, unspecified: Secondary | ICD-10-CM

## 2021-10-02 DIAGNOSIS — R7301 Impaired fasting glucose: Secondary | ICD-10-CM

## 2021-10-02 DIAGNOSIS — E782 Mixed hyperlipidemia: Secondary | ICD-10-CM

## 2021-10-02 MED ORDER — HYDROCOD POLI-CHLORPHE POLI ER 10-8 MG/5ML PO SUER: 5.0000 mL | Freq: Two times a day (BID) | ORAL | 0 refills | Status: DC | PRN

## 2021-10-02 MED ORDER — AMOXICILLIN-POT CLAVULANATE 875-125 MG PO TABS: 1.0000 | ORAL_TABLET | Freq: Two times a day (BID) | ORAL | 0 refills | Status: DC

## 2021-10-02 NOTE — Progress Notes (Signed)
[facility] [place], [place] [address]  Internal MEDICINE  Telephone Visit  Patient Name: Kayla Jimenez  [id number]  [id number]  Date of Service: 10/02/2021  I connected with the patient at 4:45 PM by telephone and verified the patients identity using two identifiers.   I discussed the limitations, risks, security and privacy concerns of performing an evaluation and management service by telephone and the availability of in person appointments. I also discussed with the patient that there may be a patient responsible charge related to the service.  The patient expressed understanding and agrees to proceed.    Chief Complaint  Patient presents with   Telephone Assessment    [id number]   Telephone Screen    Pt went to [place] [facility] on 6/11 they Covid and flu test is negative and advised to Tylenol ,zyrtec and ibuprofen    Sinusitis   Cough    Dry    Ear Pain    Right     HPI [name] presents for a telehealth virtual visit for symptoms of sinusitis. She reports dry cough, right ear pain, nasal congestion, runny nose, headache.  Symptoms have been present for >2 weeks. She went to urgent care on 09/15/21 and was instructed to take zyrtec and a nasal steroid spray. This did initially improve her symptoms but they did not fully resolve her symptoms and she has continued to exhibit signs of sinusitis in addition to allergic rhinitis symptoms.  She also needs routine labs ordered for her upcoming annual wellness and physical exam visit.    Current Medication: Outpatient Encounter Medications as of 10/02/2021  Medication Sig   amoxicillin-clavulanate (AUGMENTIN) 875-125 MG tablet Take 1 tablet by mouth 2 (two) times daily. Take with food   buPROPion ER (WELLBUTRIN SR) 100 MG 12 hr tablet Take 1 tablet (100 mg total) by mouth daily.   cetirizine (ZYRTEC) 10 MG tablet Take 1 tablet (10 mg total) by mouth daily.   chlorpheniramine-HYDROcodone (TUSSIONEX  PENNKINETIC ER) 10-8 MG/5ML Take 5 mLs by mouth every 12 (twelve) hours as needed for cough.   Cranberry-Vitamin C (CRANBERRY CONCENTRATE/VITAMINC) 15000-100 MG CAPS Take by mouth.   eszopiclone (LUNESTA) 2 MG TABS tablet Take 1 tablet (2 mg total) by mouth at bedtime as needed for sleep. Take immediately before bedtime   fluticasone (FLONASE) 50 MCG/ACT nasal spray Place 2 sprays into both nostrils daily.   montelukast (SINGULAIR) 10 MG tablet TAKE 1 TABLET BY MOUTH EVERYDAY AT BEDTIME   promethazine-dextromethorphan (PROMETHAZINE-DM) 6.25-15 MG/5ML syrup Take 5 mLs by mouth 4 (four) times daily as needed for cough.   rosuvastatin (CRESTOR) 5 MG tablet Take 1 tablet (5 mg total) by mouth daily.   Semaglutide (RYBELSUS) 3 MG TABS Take one tab po qd in am on empty stomach   sertraline (ZOLOFT) 50 MG tablet Take 1.5 tablets (75 mg total) by mouth at bedtime.   SUMAtriptan (IMITREX) 50 MG tablet Take 1 tablet (50 mg total) by mouth every 2 (two) hours as needed for migraine. May repeat in 2 hours if migraine persists or recurs.max 200 mg per 24 hours.   No facility-administered encounter medications on file as of 10/02/2021.    Surgical History: Past Surgical History:  Procedure Laterality Date   ABDOMINAL HYSTERECTOMY     ANTERIOR AND POSTERIOR REPAIR  03/16/2012   Procedure: ANTERIOR (CYSTOCELE) AND POSTERIOR REPAIR (RECTOCELE);  Surgeon: [name], MD;  Location: [facility];  Service: Gynecology;  Laterality: N/A;  anterior  repair/cysto   BLADDER SUSPENSION  03/16/2012   Procedure: TRANSVAGINAL TAPE (TVT) PROCEDURE;  Surgeon: Angela Y Roberts, MD;  Location: [facility];  Service: Gynecology;  Laterality: N/A;   BREAST REDUCTION SURGERY  6/04   CHOLECYSTECTOMY  06/28/14   CYSTOSCOPY  03/16/2012   Procedure: CYSTOSCOPY;  Surgeon: Angela Y Roberts, MD;  Location: [facility];  Service: Gynecology;  Laterality: N/A;   CYSTOSCOPY  03/16/2017   Procedure: CYSTOSCOPY;  Surgeon: Roberts, Angela, MD;   Location: [facility];  Service: Gynecology;;   DILITATION & CURRETTAGE/HYSTROSCOPY WITH NOVASURE ABLATION  03/16/2012   Procedure: DILATATION & CURETTAGE/HYSTEROSCOPY WITH NOVASURE ABLATION;  Surgeon: Angela Y Roberts, MD;  Location: [facility];  Service: Gynecology;  Laterality: N/A;   EXTRACORPOREAL SHOCK WAVE LITHOTRIPSY Right 07/18/2021   Procedure: EXTRACORPOREAL SHOCK WAVE LITHOTRIPSY (ESWL);  Surgeon: Sninsky, Brian C, MD;  Location: [facility];  Service: Urology;  Laterality: Right;   TONSILLECTOMY AND ADENOIDECTOMY     VAGINAL HYSTERECTOMY Bilateral 03/16/2017   Procedure: HYSTERECTOMY VAGINAL Bilateral Salpingectomy ;  Surgeon: Roberts, Angela, MD;  Location: [facility];  Service: Gynecology;  Laterality: Bilateral;    Medical History: Past Medical History:  Diagnosis Date   Anxiety 08/29/10   Bursitis    left foot   BV (bacterial vaginosis) 08/29/10   Complication of anesthesia 2004   vomited   Gross hematuria 07/08/2015   H/O bladder infections    H/O mumps    H/O toxoplasmosis    H/O varicella    History of kidney stones    Infections of kidney    several kidney stones, bladder infections   Kidney disease 09/03/2011   Left ureteral calculus with partial obstruction, passed 06/09/2014. CT did show perinephric stranding.    Menometrorrhagia 06/26/10   Plantar fasciitis    PMS (premenstrual syndrome) 07/10/11   Post - coital bleeding 08/29/10   Renal calculus, left    Sinus infection    UTI (lower urinary tract infection) 07/08/2015   Yeast infection     Family History: Family History  Problem Relation Age of Onset   Cancer Other        breast   Cancer Mother        cervical   Depression Mother    Drug abuse Mother    Anemia Mother        blood transfusion   Migraines Mother     Social History   Socioeconomic History   Marital status: Married    Spouse name: Not on file   Number of children: Not on file   Years of education: Not on file   Highest education level: Not on  file  Occupational History   Not on file  Tobacco Use   Smoking status: Never   Smokeless tobacco: Never  Vaping Use   Vaping Use: Never used  Substance and Sexual Activity   Alcohol use: No   Drug use: No   Sexual activity: Yes    Birth control/protection: Surgical    Comment: pt spouse had vasec.   Other Topics Concern   Not on file  Social History Narrative   Not on file   Social Determinants of Health   Financial Resource Strain: Not on file  Food Insecurity: Not on file  Transportation Needs: Not on file  Physical Activity: Not on file  Stress: Not on file  Social Connections: Not on file  Intimate Partner Violence: Not on file      Review of Systems  Constitutional:    Positive for fatigue. Negative for chills and fever.  HENT:  Positive for congestion, ear pain, postnasal drip, rhinorrhea, sinus pressure, sinus pain and sore throat.   Eyes: Negative.  Negative for pain, discharge and itching.  Respiratory:  Positive for cough, chest tightness and shortness of breath. Negative for wheezing.   Cardiovascular: Negative.  Negative for chest pain and palpitations.  Neurological:  Positive for headaches.    Vital Signs: Ht 5' 3" (1.6 m)   Wt 160 lb (72.6 kg)   LMP 02/23/2017 (Exact Date)   BMI 28.34 kg/m    Observation/Objective: She is alert, oriented and engages in conversation appropriately. She does not appear to be in any acute distress over video call.     Assessment/Plan: 1. Acute recurrent pansinusitis Empiric antibiotic treatment prescribed.  - amoxicillin-clavulanate (AUGMENTIN) 875-125 MG tablet; Take 1 tablet by mouth 2 (two) times daily. Take with food  Dispense: 20 tablet; Refill: 0  2. Acute cough Tussionex prescribed for symptomatic relief of cough - chlorpheniramine-HYDROcodone (TUSSIONEX PENNKINETIC ER) 10-8 MG/5ML; Take 5 mLs by mouth every 12 (twelve) hours as needed for cough.  Dispense: 140 mL; Refill: 0  3. Perimenopausal  symptoms Routine labs ordered.  - CBC with Differential/Platelet - CMP14+EGFR - TSH + free T4 - FSH/LH - Estradiol  4. Other fatigue Routine labs ordered - CBC with Differential/Platelet - CMP14+EGFR - TSH + free T4  5. Impaired fasting glucose Routine lab ordered - TSH + free T4  6. Mixed hyperlipidemia Routine lab ordered - Lipid Profile  7. Vitamin D deficiency Routine lab ordered - Vitamin D (25 hydroxy)  8. Vitamin B12 deficiency Routine labs ordered - CBC with Differential/Platelet - TSH + free T4 - B12 and Folate Panel - Iron, TIBC and Ferritin Panel    General Counseling: Sumire verbalizes understanding of the findings of today's phone visit and agrees with plan of treatment. I have discussed any further diagnostic evaluation that may be needed or ordered today. We also reviewed her medications today. she has been encouraged to call the office with any questions or concerns that should arise related to todays visit.  Return if symptoms worsen or fail to improve.   Orders Placed This Encounter  Procedures   CBC with Differential/Platelet   CMP14+EGFR   Lipid Profile   Vitamin D (25 hydroxy)   TSH + free T4   B12 and Folate Panel   Iron, TIBC and Ferritin Panel   FSH/LH   Estradiol    Meds ordered this encounter  Medications   amoxicillin-clavulanate (AUGMENTIN) 875-125 MG tablet    Sig: Take 1 tablet by mouth 2 (two) times daily. Take with food    Dispense:  20 tablet    Refill:  0   chlorpheniramine-HYDROcodone (TUSSIONEX PENNKINETIC ER) 10-8 MG/5ML    Sig: Take 5 mLs by mouth every 12 (twelve) hours as needed for cough.    Dispense:  140 mL    Refill:  0    Time spent:20 Minutes Time spent with patient included reviewing progress notes, labs, imaging studies, and discussing plan for follow up.  [place] Controlled Substance Database was reviewed by me for overdose risk score (ORS) if appropriate.  This patient was seen by Jonetta Osgood, FNP-C  in collaboration with Dr. Clayborn Bigness as a part of collaborative care agreement.  Renessa Wellnitz R. Valetta Fuller, MSN, FNP-C Internal medicine

## 2021-10-11 LAB — CBC WITH DIFFERENTIAL/PLATELET
Basophils Absolute: 0.1 10*3/uL (ref 0.0–0.2)
Basos: 1 %
EOS (ABSOLUTE): 0.2 10*3/uL (ref 0.0–0.4)
Eos: 2 %
Hematocrit: 40.7 % (ref 34.0–46.6)
Hemoglobin: 13.9 g/dL (ref 11.1–15.9)
Immature Grans (Abs): 0 10*3/uL (ref 0.0–0.1)
Immature Granulocytes: 0 %
Lymphocytes Absolute: 3 10*3/uL (ref 0.7–3.1)
Lymphs: 34 %
MCH: 31.7 pg (ref 26.6–33.0)
MCHC: 34.2 g/dL (ref 31.5–35.7)
MCV: 93 fL (ref 79–97)
Monocytes Absolute: 0.8 10*3/uL (ref 0.1–0.9)
Monocytes: 9 %
Neutrophils Absolute: 4.7 10*3/uL (ref 1.4–7.0)
Neutrophils: 54 %
Platelets: 320 10*3/uL (ref 150–450)
RBC: 4.39 x10E6/uL (ref 3.77–5.28)
RDW: 11.5 % — ABNORMAL LOW (ref 11.7–15.4)
WBC: 8.8 10*3/uL (ref 3.4–10.8)

## 2021-10-11 LAB — B12 AND FOLATE PANEL
Folate: 8.1 ng/mL (ref >3.0)
Vitamin B-12: 298 pg/mL (ref 232–1245)

## 2021-10-11 LAB — TSH+FREE T4
Free T4: 1.15 ng/dL (ref 0.82–1.77)
TSH: 2.05 u[IU]/mL (ref 0.450–4.500)

## 2021-10-11 LAB — IRON,TIBC AND FERRITIN PANEL
Ferritin: 131 ng/mL (ref 15–150)
Iron Saturation: 46 % (ref 15–55)
Iron: 106 ug/dL (ref 27–159)
Total Iron Binding Capacity: 230 ug/dL — ABNORMAL LOW (ref 250–450)
UIBC: 124 ug/dL — ABNORMAL LOW (ref 131–425)

## 2021-10-11 LAB — CMP14+EGFR
ALT: 13 IU/L (ref 0–32)
AST: 13 IU/L (ref 0–40)
Albumin/Globulin Ratio: 1.5 (ref 1.2–2.2)
Albumin: 4.1 g/dL (ref 3.8–4.8)
Alkaline Phosphatase: 73 IU/L (ref 44–121)
BUN/Creatinine Ratio: 22 (ref 9–23)
BUN: 15 mg/dL (ref 6–24)
Bilirubin Total: 0.5 mg/dL (ref 0.0–1.2)
CO2: 22 mmol/L (ref 20–29)
Calcium: 9.2 mg/dL (ref 8.7–10.2)
Chloride: 103 mmol/L (ref 96–106)
Creatinine, Ser: 0.68 mg/dL (ref 0.57–1.00)
Globulin, Total: 2.8 g/dL (ref 1.5–4.5)
Glucose: 91 mg/dL (ref 70–99)
Potassium: 4.4 mmol/L (ref 3.5–5.2)
Sodium: 139 mmol/L (ref 134–144)
Total Protein: 6.9 g/dL (ref 6.0–8.5)
eGFR: 109 mL/min/{1.73_m2} (ref >59)

## 2021-10-11 LAB — LIPID PANEL
Chol/HDL Ratio: 3.6 ratio (ref 0.0–4.4)
Cholesterol, Total: 133 mg/dL (ref 100–199)
HDL: 37 mg/dL — ABNORMAL LOW (ref >39)
LDL Chol Calc (NIH): 67 mg/dL (ref 0–99)
Triglycerides: 170 mg/dL — ABNORMAL HIGH (ref 0–149)
VLDL Cholesterol Cal: 29 mg/dL (ref 5–40)

## 2021-10-11 LAB — FSH/LH
FSH: 14.2 m[IU]/mL
LH: 9.2 m[IU]/mL

## 2021-10-11 LAB — VITAMIN D 25 HYDROXY (VIT D DEFICIENCY, FRACTURES): Vit D, 25-Hydroxy: 26.6 ng/mL — ABNORMAL LOW (ref 30.0–100.0)

## 2021-10-11 LAB — ESTRADIOL: Estradiol: 98.2 pg/mL

## 2021-10-15 ENCOUNTER — Encounter: Payer: Self-pay | Admitting: Nurse Practitioner

## 2021-10-15 ENCOUNTER — Ambulatory Visit (INDEPENDENT_AMBULATORY_CARE_PROVIDER_SITE_OTHER): Payer: BC Managed Care – PPO | Admitting: Internal Medicine

## 2021-10-15 VITALS — BP 141/85 | HR 78 | Temp 97.7°F | Resp 16 | Ht 63.0 in | Wt 157.2 lb

## 2021-10-15 DIAGNOSIS — I10 Essential (primary) hypertension: Secondary | ICD-10-CM

## 2021-10-15 DIAGNOSIS — E782 Mixed hyperlipidemia: Secondary | ICD-10-CM | POA: Diagnosis not present

## 2021-10-15 DIAGNOSIS — Z6827 Body mass index (BMI) 27.0-27.9, adult: Secondary | ICD-10-CM | POA: Diagnosis not present

## 2021-10-15 DIAGNOSIS — E538 Deficiency of other specified B group vitamins: Secondary | ICD-10-CM

## 2021-10-15 MED ORDER — CYANOCOBALAMIN 1000 MCG/ML IJ SOLN
1000.0000 ug | Freq: Once | INTRAMUSCULAR | Status: AC
  Administered 2021-10-15: 1000 ug via INTRAMUSCULAR

## 2021-10-15 MED ORDER — BISOPROLOL-HYDROCHLOROTHIAZIDE 2.5-6.25 MG PO TABS: 1.0000 | ORAL_TABLET | Freq: Every day | ORAL | 1 refills | Status: DC

## 2021-10-15 MED ORDER — CYANOCOBALAMIN 1000 MCG/ML IJ SOLN: 1000.0000 ug | INTRAMUSCULAR | 10 refills | Status: DC

## 2021-10-15 NOTE — Progress Notes (Signed)
[facility] [place], [place] [address]  Internal MEDICINE  Office Visit Note  Patient Name: Kayla Jimenez  [id number]  [id number]  Date of Service: 10/15/2021  Chief Complaint  Patient presents with   Follow-up    HPI Patient is seen here for routine follow-up. 1.  Her blood pressure has been elevated today and last few times discussion about antihypertension has been initiated in the past we will. 2.  Patient is on Crestor and her lipid profile is improved. 3.  Patient did not see any results with Rybelsus and will try something else for weight loss. 4.  B12 level is borderline low will need replacement. 5.  Her FSH and LH level is normal and she is not menopausal yet 6.  She takes Lunesta for insomnia. 7.  She is also on Zoloft and Wellbutrin for GAD and depression 8. Allergies are controlled   Current Medication: Outpatient Encounter Medications as of 10/15/2021  Medication Sig   amoxicillin-clavulanate (AUGMENTIN) 875-125 MG tablet Take 1 tablet by mouth 2 (two) times daily. Take with food   bisoprolol-hydrochlorothiazide (ZIAC) 2.5-6.25 MG tablet Take 1 tablet by mouth daily.   buPROPion ER (WELLBUTRIN SR) 100 MG 12 hr tablet Take 1 tablet (100 mg total) by mouth daily.   cetirizine (ZYRTEC) 10 MG tablet Take 1 tablet (10 mg total) by mouth daily.   Cranberry-Vitamin C (CRANBERRY CONCENTRATE/VITAMINC) 15000-100 MG CAPS Take by mouth.   cyanocobalamin (,VITAMIN B-12,) 1000 MCG/ML injection Inject 1 mL (1,000 mcg total) into the muscle every 30 (thirty) days. Pt needs syringes as well   eszopiclone (LUNESTA) 2 MG TABS tablet Take 1 tablet (2 mg total) by mouth at bedtime as needed for sleep. Take immediately before bedtime   fluticasone (FLONASE) 50 MCG/ACT nasal spray Place 2 sprays into both nostrils daily.   montelukast (SINGULAIR) 10 MG tablet TAKE 1 TABLET BY MOUTH EVERYDAY AT BEDTIME   rosuvastatin (CRESTOR) 5 MG tablet Take 1 tablet (5 mg  total) by mouth daily.   sertraline (ZOLOFT) 50 MG tablet Take 1.5 tablets (75 mg total) by mouth at bedtime.   SUMAtriptan (IMITREX) 50 MG tablet Take 1 tablet (50 mg total) by mouth every 2 (two) hours as needed for migraine. May repeat in 2 hours if migraine persists or recurs.max 200 mg per 24 hours.   [DISCONTINUED] chlorpheniramine-HYDROcodone (TUSSIONEX PENNKINETIC ER) 10-8 MG/5ML Take 5 mLs by mouth every 12 (twelve) hours as needed for cough.   [DISCONTINUED] promethazine-dextromethorphan (PROMETHAZINE-DM) 6.25-15 MG/5ML syrup Take 5 mLs by mouth 4 (four) times daily as needed for cough.   [DISCONTINUED] Semaglutide (RYBELSUS) 3 MG TABS Take one tab po qd in am on empty stomach   [EXPIRED] cyanocobalamin ((VITAMIN B-12)) injection 1,000 mcg    No facility-administered encounter medications on file as of 10/15/2021.    Surgical History: Past Surgical History:  Procedure Laterality Date   ABDOMINAL HYSTERECTOMY     ANTERIOR AND POSTERIOR REPAIR  03/16/2012   Procedure: ANTERIOR (CYSTOCELE) AND POSTERIOR REPAIR (RECTOCELE);  Surgeon: [name], MD;  Location: [facility];  Service: Gynecology;  Laterality: N/A;  anterior repair/cysto   BLADDER SUSPENSION  03/16/2012   Procedure: TRANSVAGINAL TAPE (TVT) PROCEDURE;  Surgeon: [name], MD;  Location: [facility];  Service: Gynecology;  Laterality: N/A;   BREAST REDUCTION SURGERY  6/04   CHOLECYSTECTOMY  06/28/14   CYSTOSCOPY  03/16/2012   Procedure: CYSTOSCOPY;  Surgeon: [name], MD;  Location: [facility];  Service: Gynecology;  Laterality: N/A;   CYSTOSCOPY  03/16/2017   Procedure: CYSTOSCOPY;  Surgeon: Everett Graff, MD;  Location: [facility];  Service: Gynecology;;   [place]  03/16/2012   Procedure: DILATATION & CURETTAGE/HYSTEROSCOPY WITH NOVASURE ABLATION;  Surgeon: [name], MD;  Location: [facility];  Service: Gynecology;  Laterality: N/A;   EXTRACORPOREAL SHOCK WAVE  LITHOTRIPSY Right 07/18/2021   Procedure: EXTRACORPOREAL SHOCK WAVE LITHOTRIPSY (ESWL);  Surgeon: Billey Co, MD;  Location: [facility];  Service: Urology;  Laterality: Right;   TONSILLECTOMY AND ADENOIDECTOMY     VAGINAL HYSTERECTOMY Bilateral 03/16/2017   Procedure: HYSTERECTOMY VAGINAL Bilateral Salpingectomy ;  Surgeon: Everett Graff, MD;  Location: [facility];  Service: Gynecology;  Laterality: Bilateral;    Medical History: Past Medical History:  Diagnosis Date   Anxiety 08/29/10   Bursitis    left foot   BV (bacterial vaginosis) 7/41/28   Complication of anesthesia 2004   vomited   Gross hematuria 07/08/2015   H/O bladder infections    H/O mumps    H/O toxoplasmosis    H/O varicella    History of kidney stones    Infections of kidney    several kidney stones, bladder infections   Kidney disease 09/03/2011   Left ureteral calculus with partial obstruction, passed 06/09/2014. CT did show perinephric stranding.    Menometrorrhagia 06/26/10   Plantar fasciitis    PMS (premenstrual syndrome) 07/10/11   Post - coital bleeding 08/29/10   Renal calculus, left    Sinus infection    UTI (lower urinary tract infection) 07/08/2015   Yeast infection     Family History: Family History  Problem Relation Age of Onset   Cancer Other        breast   Cancer Mother        cervical   Depression Mother    Drug abuse Mother    Anemia Mother        blood transfusion   Migraines Mother     Social History   Socioeconomic History   Marital status: Married    Spouse name: Not on file   Number of children: Not on file   Years of education: Not on file   Highest education level: Not on file  Occupational History   Not on file  Tobacco Use   Smoking status: Never   Smokeless tobacco: Never  Vaping Use   Vaping Use: Never used  Substance and Sexual Activity   Alcohol use: No   Drug use: No   Sexual activity: Yes    Birth control/protection: Surgical    Comment: pt spouse had  vasec.   Other Topics Concern   Not on file  Social History Narrative   Not on file   Social Determinants of Health   Financial Resource Strain: Not on file  Food Insecurity: Not on file  Transportation Needs: Not on file  Physical Activity: Not on file  Stress: Not on file  Social Connections: Not on file  Intimate Partner Violence: Not on file      Review of Systems  Constitutional:  Negative for chills, fatigue and unexpected weight change.  HENT:  Positive for postnasal drip. Negative for congestion, rhinorrhea, sneezing and sore throat.   Eyes:  Negative for redness.  Respiratory:  Negative for cough, chest tightness and shortness of breath.   Cardiovascular:  Negative for chest pain and palpitations.  Gastrointestinal:  Negative for abdominal pain, constipation, diarrhea, nausea  and vomiting.  Genitourinary:  Negative for dysuria and frequency.  Musculoskeletal:  Negative for arthralgias, back pain, joint swelling and neck pain.  Skin:  Negative for rash.  Neurological: Negative.  Negative for tremors and numbness.  Hematological:  Negative for adenopathy. Does not bruise/bleed easily.  Psychiatric/Behavioral:  Negative for behavioral problems (Depression), sleep disturbance and suicidal ideas. The patient is not nervous/anxious.     Vital Signs: BP (!) 141/85   Pulse 78   Temp 97.7 F (36.5 C)   Resp 16   Ht '5\' 3"'$  (1.6 m)   Wt 157 lb 3.2 oz (71.3 kg)   LMP 02/23/2017 (Exact Date)   SpO2 98%   BMI 27.85 kg/m    Physical Exam Constitutional:      Appearance: Normal appearance.  HENT:     Head: Normocephalic and atraumatic.     Nose: Nose normal.     Mouth/Throat:     Mouth: Mucous membranes are moist.     Pharynx: No posterior oropharyngeal erythema.  Eyes:     Extraocular Movements: Extraocular movements intact.     Pupils: Pupils are equal, round, and reactive to light.  Cardiovascular:     Pulses: Normal pulses.     Heart sounds: Normal heart  sounds.  Pulmonary:     Effort: Pulmonary effort is normal.     Breath sounds: Normal breath sounds.  Neurological:     General: No focal deficit present.     Mental Status: She is alert.  Psychiatric:        Mood and Affect: Mood normal.        Behavior: Behavior normal.      Assessment/Plan: 1. Vitamin B12 deficiency Patient is B12 deficient will start on B12 replacement therapy - cyanocobalamin ((VITAMIN B-12)) injection 1,000 mcg - cyanocobalamin (,VITAMIN B-12,) 1000 MCG/ML injection; Inject 1 mL (1,000 mcg total) into the muscle every 30 (thirty) days. Pt needs syringes as well  Dispense: 1 mL; Refill: 10  2. Benign hypertension Elevated blood pressure we will start on bisoprolol, patient will keep an eye on her blood pressure at home - bisoprolol-hydrochlorothiazide (ZIAC) 2.5-6.25 MG tablet; Take 1 tablet by mouth daily.  Dispense: 90 tablet; Refill: 1  3. Body mass index (BMI) 27.0-27.9, adult Patient is a good candidate for phentermine( 15 mg) low-dose and low-dose Topamax( 25 mg ) once a day after the metabolic test is done and the blood pressure is well controlled  4. Mixed hyperlipidemia Improved lipid profile we will continue Crestor as before  General Counseling: Shae verbalizes understanding of the findings of todays visit and agrees with plan of treatment. I have discussed any further diagnostic evaluation that may be needed or ordered today. We also reviewed her medications today. she has been encouraged to call the office with any questions or concerns that should arise related to todays visit.      Meds ordered this encounter  Medications   bisoprolol-hydrochlorothiazide (ZIAC) 2.5-6.25 MG tablet    Sig: Take 1 tablet by mouth daily.    Dispense:  90 tablet    Refill:  1   cyanocobalamin ((VITAMIN B-12)) injection 1,000 mcg   cyanocobalamin (,VITAMIN B-12,) 1000 MCG/ML injection    Sig: Inject 1 mL (1,000 mcg total) into the muscle every 30 (thirty)  days. Pt needs syringes as well    Dispense:  1 mL    Refill:  10    Total time spent:35 Minutes Time spent includes review of chart, medications, test  results, and follow up plan with the patient.   [place] Controlled Substance Database was reviewed by me.   Dr Lavera Guise Internal medicine

## 2021-10-24 ENCOUNTER — Other Ambulatory Visit: Payer: Self-pay

## 2021-10-24 MED ORDER — FLUCONAZOLE 150 MG PO TABS: ORAL_TABLET | ORAL | 0 refills | Status: DC

## 2021-10-24 NOTE — Telephone Encounter (Signed)
Pt called c/o having yeast symptoms and requested to get medication sent to pharmacy, per Lauren we sent Diflucan 150 mg to pharmacy.  Pt informed

## 2021-11-11 ENCOUNTER — Other Ambulatory Visit: Payer: Self-pay | Admitting: Nurse Practitioner

## 2021-11-11 DIAGNOSIS — E782 Mixed hyperlipidemia: Secondary | ICD-10-CM

## 2021-11-12 ENCOUNTER — Telehealth: Payer: Self-pay

## 2021-11-12 ENCOUNTER — Encounter: Payer: Self-pay | Admitting: Nurse Practitioner

## 2021-11-12 ENCOUNTER — Ambulatory Visit (INDEPENDENT_AMBULATORY_CARE_PROVIDER_SITE_OTHER): Payer: BC Managed Care – PPO | Admitting: Nurse Practitioner

## 2021-11-12 VITALS — BP 104/66 | HR 73 | Temp 97.8°F | Resp 16 | Ht 63.0 in | Wt 156.0 lb

## 2021-11-12 DIAGNOSIS — E538 Deficiency of other specified B group vitamins: Secondary | ICD-10-CM | POA: Diagnosis not present

## 2021-11-12 DIAGNOSIS — J301 Allergic rhinitis due to pollen: Secondary | ICD-10-CM

## 2021-11-12 DIAGNOSIS — E559 Vitamin D deficiency, unspecified: Secondary | ICD-10-CM

## 2021-11-12 DIAGNOSIS — I10 Essential (primary) hypertension: Secondary | ICD-10-CM

## 2021-11-12 DIAGNOSIS — F418 Other specified anxiety disorders: Secondary | ICD-10-CM

## 2021-11-12 DIAGNOSIS — Z6827 Body mass index (BMI) 27.0-27.9, adult: Secondary | ICD-10-CM

## 2021-11-12 DIAGNOSIS — F5101 Primary insomnia: Secondary | ICD-10-CM

## 2021-11-12 MED ORDER — MONTELUKAST SODIUM 10 MG PO TABS: ORAL_TABLET | ORAL | 1 refills | Status: DC

## 2021-11-12 MED ORDER — ESZOPICLONE 2 MG PO TABS: 2.0000 mg | ORAL_TABLET | Freq: Every evening | ORAL | 2 refills | Status: DC | PRN

## 2021-11-12 MED ORDER — ALPRAZOLAM 0.25 MG PO TABS: 0.2500 mg | ORAL_TABLET | Freq: Two times a day (BID) | ORAL | 0 refills | Status: DC | PRN

## 2021-11-12 NOTE — Telephone Encounter (Signed)
Error

## 2021-11-12 NOTE — Progress Notes (Signed)
[facility] [place], [place] [address]  Internal MEDICINE  Office Visit Note  Patient Name: Kayla Jimenez  [id number]  [id number]  Date of Service: 11/12/2021  Chief Complaint  Patient presents with   Follow-up   Medical Management of Chronic Issues    Weight management    Anxiety    HPI Brina presents for follow-up visit for hypertension, hyperlipidemia, anxiety and lab results. --Blood pressure is significantly improved with bisoprolol-hydrochlorothiazide that was prescribed in July --Denies any adverse side effects of statin medication, cholesterol levels significantly improved on her most recent labs which was mentioned in her previous visit --Anxiety level is high right now.  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  Her mother also passed away in Sep 12, 2022 and her husband has also had significant health issues this year.  Patient states that her high anxiety level has caused her to have crying spells.  Patient would like to have something to take just temporarily when her anxiety is severe to get through the next few weeks. --Due for refills of Lunesta and montelukast     Current Medication: Outpatient Encounter Medications as of 11/12/2021  Medication Sig   ALPRAZolam (XANAX) 0.25 MG tablet Take 1 tablet (0.25 mg total) by mouth 2 (two) times daily as needed (severe anxiety).   bisoprolol-hydrochlorothiazide (ZIAC) 2.5-6.25 MG tablet Take 1 tablet by mouth daily.   buPROPion ER (WELLBUTRIN SR) 100 MG 12 hr tablet Take 1 tablet (100 mg total) by mouth daily.   cetirizine (ZYRTEC) 10 MG tablet Take 1 tablet (10 mg total) by mouth daily.   Cranberry-Vitamin C (CRANBERRY CONCENTRATE/VITAMINC) 15000-100 MG CAPS Take by mouth.   cyanocobalamin (,VITAMIN B-12,) 1000 MCG/ML injection Inject  1 mL (1,000 mcg total) into the muscle every 30 (thirty) days. Pt needs syringes as well   fluconazole (DIFLUCAN) 150 MG tablet Take 1 tablet by mouth once and may repeat in 3 days if symptoms persist   fluticasone (FLONASE) 50 MCG/ACT nasal spray Place 2 sprays into both nostrils daily.   rosuvastatin (CRESTOR) 5 MG tablet TAKE 1 TABLET (5 MG TOTAL) BY MOUTH DAILY.   sertraline (ZOLOFT) 50 MG tablet Take 1.5 tablets (75 mg total) by mouth at bedtime.   SUMAtriptan (IMITREX) 50 MG tablet Take 1 tablet (50 mg total) by mouth every 2 (two) hours as needed for migraine. May repeat in 2 hours if migraine persists or recurs.max 200 mg per 24 hours.   [DISCONTINUED] amoxicillin-clavulanate (AUGMENTIN) 875-125 MG tablet Take 1 tablet by mouth 2 (two) times daily. Take with food   [DISCONTINUED] eszopiclone (LUNESTA) 2 MG TABS tablet Take 1 tablet (2 mg total) by mouth at bedtime as needed for sleep. Take immediately before bedtime   [DISCONTINUED] montelukast (SINGULAIR) 10 MG tablet TAKE 1 TABLET BY MOUTH EVERYDAY AT BEDTIME   eszopiclone (LUNESTA) 2 MG TABS tablet Take 1 tablet (2 mg total) by mouth at bedtime as needed for sleep. Take immediately before bedtime   montelukast (SINGULAIR) 10 MG tablet TAKE 1 TABLET BY MOUTH EVERYDAY AT BEDTIME   No facility-administered encounter medications on file as of 11/12/2021.    Surgical History: Past Surgical History:  Procedure Laterality Date   ABDOMINAL HYSTERECTOMY     ANTERIOR AND POSTERIOR REPAIR  03/16/2012   Procedure: ANTERIOR (CYSTOCELE) AND POSTERIOR REPAIR (  RECTOCELE);  Surgeon: [name], MD;  Location: [facility];  Service: Gynecology;  Laterality: N/A;  anterior repair/cysto   BLADDER SUSPENSION  03/16/2012   Procedure: TRANSVAGINAL TAPE (TVT) PROCEDURE;  Surgeon: [name], MD;  Location: [facility];  Service: Gynecology;  Laterality: N/A;   BREAST REDUCTION SURGERY  6/04   CHOLECYSTECTOMY  06/28/14   CYSTOSCOPY  03/16/2012    Procedure: CYSTOSCOPY;  Surgeon: [name], MD;  Location: [facility];  Service: Gynecology;  Laterality: N/A;   CYSTOSCOPY  03/16/2017   Procedure: CYSTOSCOPY;  Surgeon: Everett Graff, MD;  Location: [facility];  Service: Gynecology;;   [place]  03/16/2012   Procedure: DILATATION & CURETTAGE/HYSTEROSCOPY WITH NOVASURE ABLATION;  Surgeon: [name], MD;  Location: [facility];  Service: Gynecology;  Laterality: N/A;   EXTRACORPOREAL SHOCK WAVE LITHOTRIPSY Right 07/18/2021   Procedure: EXTRACORPOREAL SHOCK WAVE LITHOTRIPSY (ESWL);  Surgeon: Billey Co, MD;  Location: [facility];  Service: Urology;  Laterality: Right;   TONSILLECTOMY AND ADENOIDECTOMY     VAGINAL HYSTERECTOMY Bilateral 03/16/2017   Procedure: HYSTERECTOMY VAGINAL Bilateral Salpingectomy ;  Surgeon: Everett Graff, MD;  Location: [facility];  Service: Gynecology;  Laterality: Bilateral;    Medical History: Past Medical History:  Diagnosis Date   Anxiety 08/29/10   Bursitis    left foot   BV (bacterial vaginosis) 11/13/96   Complication of anesthesia 2004   vomited   Gross hematuria 07/08/2015   H/O bladder infections    H/O mumps    H/O toxoplasmosis    H/O varicella    History of kidney stones    Infections of kidney    several kidney stones, bladder infections   Kidney disease 09/03/2011   Left ureteral calculus with partial obstruction, passed 06/09/2014. CT did show perinephric stranding.    Menometrorrhagia 06/26/10   Plantar fasciitis    PMS (premenstrual syndrome) 07/10/11   Post - coital bleeding 08/29/10   Renal calculus, left    Sinus infection    UTI (lower urinary tract infection) 07/08/2015   Yeast infection     Family History: Family History  Problem Relation Age of Onset   Cancer Other        breast   Cancer Mother        cervical   Depression Mother    Drug abuse Mother    Anemia Mother        blood transfusion   Migraines Mother     Social  History   Socioeconomic History   Marital status: Married    Spouse name: Not on file   Number of children: Not on file   Years of education: Not on file   Highest education level: Not on file  Occupational History   Not on file  Tobacco Use   Smoking status: Never   Smokeless tobacco: Never  Vaping Use   Vaping Use: Never used  Substance and Sexual Activity   Alcohol use: No   Drug use: No   Sexual activity: Yes    Birth control/protection: Surgical    Comment: pt spouse had vasec.   Other Topics Concern   Not on file  Social History Narrative   Not on file   Social Determinants of Health   Financial Resource Strain: Not on file  Food Insecurity: Not on file  Transportation Needs: Not on file  Physical Activity: Not on file  Stress: Not on file  Social Connections: Not on  file  Intimate Partner Violence: Not on file      Review of Systems  Constitutional:  Negative for chills, fatigue and unexpected weight change.  HENT: Negative.  Negative for congestion, rhinorrhea, sneezing and sore throat.   Eyes:  Negative for redness.  Respiratory: Negative.  Negative for cough, chest tightness, shortness of breath and wheezing.   Cardiovascular: Negative.  Negative for chest pain and palpitations.  Gastrointestinal:  Negative for abdominal pain, constipation, diarrhea, nausea and vomiting.  Genitourinary:  Negative for dysuria and frequency.  Musculoskeletal:  Negative for arthralgias, back pain, joint swelling and neck pain.  Skin:  Negative for rash.  Neurological: Negative.  Negative for tremors and numbness.  Hematological:  Negative for adenopathy. Does not bruise/bleed easily.  Psychiatric/Behavioral:  Positive for decreased concentration and sleep disturbance. Negative for behavioral problems (Depression), self-injury and suicidal ideas. The patient is nervous/anxious.     Vital Signs: BP 104/66   Pulse 73   Temp 97.8 F (36.6 C)   Resp 16   Ht '5\' 3"'$  (1.6 m)    Wt 156 lb (70.8 kg)   LMP 02/23/2017 (Exact Date)   SpO2 99%   BMI 27.63 kg/m    Physical Exam Vitals reviewed.  Constitutional:      General: She is not in acute distress.    Appearance: Normal appearance. She is well-developed, well-groomed and overweight. She is not ill-appearing.  HENT:     Head: Normocephalic and atraumatic.  Eyes:     Pupils: Pupils are equal, round, and reactive to light.  Cardiovascular:     Rate and Rhythm: Normal rate and regular rhythm.  Pulmonary:     Effort: Pulmonary effort is normal. No respiratory distress.  Neurological:     Mental Status: She is alert and oriented to person, place, and time.  Psychiatric:        Mood and Affect: Mood normal.        Behavior: Behavior normal.        Assessment/Plan: 1. Benign hypertension Blood pressure has significantly improved with the addition of bisoprolol-hydrochlorothiazide  2. Vitamin B12 deficiency Continue B12 injections as instructed  3. Vitamin D deficiency Encouraged over-the-counter vitamin D supplement 5000 units daily  4. Allergic rhinitis due to pollen, unspecified seasonality Refill ordered - montelukast (SINGULAIR) 10 MG tablet; TAKE 1 TABLET BY MOUTH EVERYDAY AT BEDTIME  Dispense: 90 tablet; Refill: 1  5. Body mass index (BMI) 27.0-27.9, adult Patient is still interested in discussing weight loss medications.  She is also discussed this briefly with Dr. Clayborn Bigness.  The plan will be to have the patient follow-up for weight loss management with Dr. Clayborn Bigness.  6. Primary insomnia Current dose remains effective, refills ordered - eszopiclone (LUNESTA) 2 MG TABS tablet; Take 1 tablet (2 mg total) by mouth at bedtime as needed for sleep. Take immediately before bedtime  Dispense: 30 tablet; Refill: 2  7. Situational anxiety Due to increased stress and anxiety in her work and home life, a small dose alprazolam prescription was ordered for the patient to take as needed for severe  anxiety.  Patient informed that this is only temporary to help her get through the next few weeks with school starting and that there will be no refills on this medication.  Patient does not want to take anything long-term and is agreeable to the plan and confirms that she needs something to take when the anxiety is severe and just for the next few weeks and then  is okay with not continuing the medication after that - ALPRAZolam (XANAX) 0.25 MG tablet; Take 1 tablet (0.25 mg total) by mouth 2 (two) times daily as needed (severe anxiety).  Dispense: 20 tablet; Refill: 0   General Counseling: Amiyrah verbalizes understanding of the findings of todays visit and agrees with plan of treatment. I have discussed any further diagnostic evaluation that may be needed or ordered today. We also reviewed her medications today. she has been encouraged to call the office with any questions or concerns that should arise related to todays visit.    No orders of the defined types were placed in this encounter.   Meds ordered this encounter  Medications   ALPRAZolam (XANAX) 0.25 MG tablet    Sig: Take 1 tablet (0.25 mg total) by mouth 2 (two) times daily as needed (severe anxiety).    Dispense:  20 tablet    Refill:  0   eszopiclone (LUNESTA) 2 MG TABS tablet    Sig: Take 1 tablet (2 mg total) by mouth at bedtime as needed for sleep. Take immediately before bedtime    Dispense:  30 tablet    Refill:  2   montelukast (SINGULAIR) 10 MG tablet    Sig: TAKE 1 TABLET BY MOUTH EVERYDAY AT BEDTIME    Dispense:  90 tablet    Refill:  1    Return for F/U, med refill, Magaret Justo PCP lunesta, will discuss wt loss w/DFK.   Total time spent:30 Minutes Time spent includes review of chart, medications, test results, and follow up plan with the patient.   [place] Controlled Substance Database was reviewed by me.  This patient was seen by Jonetta Osgood, FNP-C in collaboration with Dr. Clayborn Bigness as a part of collaborative  care agreement.   Tytus Strahle R. Valetta Fuller, MSN, FNP-C Internal medicine

## 2021-12-17 ENCOUNTER — Ambulatory Visit
Admission: EM | Admit: 2021-12-17 | Discharge: 2021-12-17 | Disposition: A | Discharge status: Home / Self Care | Payer: BC Managed Care – PPO | Attending: Urgent Care | Admitting: Urgent Care

## 2021-12-17 ENCOUNTER — Other Ambulatory Visit: Payer: Self-pay

## 2021-12-17 ENCOUNTER — Encounter: Payer: Self-pay | Admitting: Emergency Medicine

## 2021-12-17 DIAGNOSIS — J310 Chronic rhinitis: Secondary | ICD-10-CM | POA: Diagnosis not present

## 2021-12-17 DIAGNOSIS — R051 Acute cough: Secondary | ICD-10-CM

## 2021-12-17 DIAGNOSIS — J329 Chronic sinusitis, unspecified: Secondary | ICD-10-CM

## 2021-12-17 MED ORDER — BENZONATATE 100 MG PO CAPS: ORAL_CAPSULE | ORAL | 0 refills | Status: DC

## 2021-12-17 MED ORDER — METHYLPREDNISOLONE 4 MG PO TBPK: ORAL_TABLET | ORAL | 0 refills | Status: DC

## 2021-12-17 NOTE — ED Triage Notes (Signed)
Patient c/o nonproductive cough and RT ear pain x 1 week.   Patient denies fever.   Patient endorses nasal drainage. Patient endorses throat irritation.   Patient has taken ibuprofen, Suadafed, and singular with no relief of symptoms.

## 2021-12-17 NOTE — Discharge Instructions (Signed)
If your symptoms do not significantly improve within a few days please return for additional evaluation and possible prescription of an antibiotic.  Follow-up with your primary care provider as needed

## 2021-12-17 NOTE — ED Provider Notes (Signed)
Kayla Jimenez    CSN: [id number] Arrival date & time: 12/17/21  1756      History   Chief Complaint Chief Complaint  Patient presents with   Cough   Otalgia    HPI Kayla Jimenez is a 46 y.o. female.    Cough Associated symptoms: ear pain   Otalgia Associated symptoms: cough     Presents to primary care with symptoms x1 week  Cough that is nonproductive. R otalgia. Sinus pain/pressure and "feels drainage". Sore throat, worse with cough.  Past Medical History:  Diagnosis Date   Anxiety 08/29/10   Bursitis    left foot   BV (bacterial vaginosis) 2/83/15   Complication of anesthesia 2004   vomited   Gross hematuria 07/08/2015   H/O bladder infections    H/O mumps    H/O toxoplasmosis    H/O varicella    History of kidney stones    Infections of kidney    several kidney stones, bladder infections   Kidney disease 09/03/2011   Left ureteral calculus with partial obstruction, passed 06/09/2014. CT did show perinephric stranding.    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   Loss of hair 10/07/2018   Body mass index (BMI) of 25.0 to 29.9 10/07/2018   Hematuria 06/28/2018   Right flank pain 06/28/2018   H/O: hysterectomy 11/19/2017   Dysuria 10/20/2017   Allergic rhinitis due to pollen 08/12/2017   Attention and concentration deficit 08/12/2017   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   Primary insomnia 08/12/2017   Irregular bleeding 03/16/2017   Plantar fasciitis  03/10/2017   SUI (stress urinary incontinence, female) 08/14/2015   Cystocele, midline 08/14/2015   Renal calculi 07/26/2015   Urinary tract infection with hematuria 07/08/2015   Gross hematuria 07/08/2015   History of renal stone 07/08/2015   Abdominal bloating 07/10/2014   Gallstones 06/13/2014   Yeast infection 09/03/2011   H/O varicella 09/03/2011   H/O mumps 09/03/2011   Post - coital bleeding 09/03/2011   Anxiety 09/03/2011   Premenstrual tension syndrome 09/03/2011   H/O bladder infections 09/03/2011   Kidney disease 09/03/2011   Menorrhagia 07/10/2011    Past Surgical History:  Procedure Laterality Date   ABDOMINAL HYSTERECTOMY     ANTERIOR AND POSTERIOR REPAIR  03/16/2012   Procedure: ANTERIOR (CYSTOCELE) AND POSTERIOR REPAIR (RECTOCELE);  Surgeon: [name], MD;  Location: [facility];  Service: Gynecology;  Laterality: N/A;  anterior repair/cysto   BLADDER SUSPENSION  03/16/2012   Procedure: TRANSVAGINAL TAPE (TVT) PROCEDURE;  Surgeon: [name], MD;  Location: [facility];  Service: Gynecology;  Laterality: N/A;   BREAST REDUCTION SURGERY  6/04   CHOLECYSTECTOMY  06/28/14   CYSTOSCOPY  03/16/2012   Procedure: CYSTOSCOPY;  Surgeon: [name], MD;  Location: [facility];  Service: Gynecology;  Laterality: N/A;   CYSTOSCOPY  03/16/2017   Procedure: CYSTOSCOPY;  Surgeon: Everett Graff, MD;  Location: [facility];  Service: Gynecology;;   DILITATION &  CURRETTAGE/HYSTROSCOPY WITH NOVASURE ABLATION  03/16/2012   Procedure: DILATATION & CURETTAGE/HYSTEROSCOPY WITH NOVASURE ABLATION;  Surgeon: [name], MD;  Location: [facility];  Service: Gynecology;  Laterality: N/A;   EXTRACORPOREAL SHOCK WAVE LITHOTRIPSY Right 07/18/2021   Procedure: EXTRACORPOREAL SHOCK WAVE LITHOTRIPSY (ESWL);  Surgeon: Billey Co, MD;  Location: [facility];  Service: Urology;  Laterality: Right;   TONSILLECTOMY AND ADENOIDECTOMY     VAGINAL HYSTERECTOMY Bilateral 03/16/2017   Procedure:  HYSTERECTOMY VAGINAL Bilateral Salpingectomy ;  Surgeon: Everett Graff, MD;  Location: [facility];  Service: Gynecology;  Laterality: Bilateral;    OB History     Gravida  2   Para  2   Term  2   Preterm      AB      Living  2      SAB      IAB      Ectopic      Multiple      Live Births  2            Home Medications    Prior to Admission medications   Medication Sig Start Date End Date Taking? Authorizing Provider  bisoprolol-hydrochlorothiazide (ZIAC) 2.5-6.25 MG tablet Take 1 tablet by mouth daily. 10/15/21  Yes Lavera Guise, MD  buPROPion ER [facility] SR) 100 MG 12 hr tablet Take 1 tablet (100 mg total) by mouth daily. 07/15/21  Yes Abernathy, Yetta Flock, NP  cetirizine (ZYRTEC) 10 MG tablet Take 1 tablet (10 mg total) by mouth daily. 09/15/21  Yes Leath-Warren, Alda Lea, NP  eszopiclone (LUNESTA) 2 MG TABS tablet Take 1 tablet (2 mg total) by mouth at bedtime as needed for sleep. Take immediately before bedtime 11/12/21  Yes Abernathy, Alyssa, NP  montelukast (SINGULAIR) 10 MG tablet TAKE 1 TABLET BY MOUTH EVERYDAY AT BEDTIME 11/12/21  Yes Abernathy, Alyssa, NP  rosuvastatin (CRESTOR) 5 MG tablet TAKE 1 TABLET (5 MG TOTAL) BY MOUTH DAILY. 11/11/21  Yes Abernathy, Yetta Flock, NP  sertraline (ZOLOFT) 50 MG tablet Take 1.5 tablets (75 mg total) by mouth at bedtime. 07/15/21  Yes Abernathy, Yetta Flock, NP  ALPRAZolam (XANAX) 0.25 MG tablet Take 1 tablet (0.25 mg total) by mouth 2 (two) times daily as needed (severe anxiety). 11/12/21   Jonetta Osgood, NP  Cranberry-Vitamin C (CRANBERRY CONCENTRATE/VITAMINC) 15000-100 MG CAPS Take by mouth.    [provider]  cyanocobalamin (,VITAMIN B-12,) 1000 MCG/ML injection Inject 1 mL (1,000 mcg total) into the muscle every 30 (thirty) days. Pt needs syringes as well 10/15/21   Lavera Guise, MD  fluconazole (DIFLUCAN) 150 MG tablet Take 1 tablet by mouth once and may repeat in 3 days if symptoms persist 10/24/21   Lavera Guise, MD   fluticasone [facility]) 50 MCG/ACT nasal spray Place 2 sprays into both nostrils daily. Patient taking differently: Place 2 sprays into both nostrils as needed. 09/15/21   Leath-Warren, Alda Lea, NP  SUMAtriptan (IMITREX) 50 MG tablet Take 1 tablet (50 mg total) by mouth every 2 (two) hours as needed for migraine. May repeat in 2 hours if migraine persists or recurs.max 200 mg per 24 hours. 07/15/21   Jonetta Osgood, NP    Family History Family History  Problem Relation Age of Onset   Cancer Other        breast   Cancer Mother        cervical   Depression Mother    Drug abuse Mother    Anemia Mother  blood transfusion   Migraines Mother     Social History Social History   Tobacco Use   Smoking status: Never   Smokeless tobacco: Never  Vaping Use   Vaping Use: Never used  Substance Use Topics   Alcohol use: No   Drug use: No     Allergies   Other, Percocet [oxycodone-acetaminophen], Sulfa antibiotics, and Sulfonamide derivatives   Review of Systems Review of Systems  HENT:  Positive for ear pain.   Respiratory:  Positive for cough.      Physical Exam Triage Vital Signs ED Triage Vitals  Enc Vitals Group     BP 12/17/21 1816 110/72     Pulse Rate 12/17/21 1816 78     Resp 12/17/21 1816 14     Temp 12/17/21 1816 98.9 F (37.2 C)     Temp Source 12/17/21 1816 Oral     SpO2 12/17/21 1816 96 %     Weight --      Height --      Head Circumference --      Peak Flow --      Pain Score 12/17/21 1813 5     Pain Loc --      Pain Edu? --      Excl. in [place]? --    No data found.  Updated Vital Signs BP 110/72 (BP Location: Left Arm)   Pulse 78   Temp 98.9 F (37.2 C) (Oral)   Resp 14   LMP 02/23/2017 (Exact Date)   SpO2 96%   Visual Acuity Right Eye Distance:   Left Eye Distance:   Bilateral Distance:    Right Eye Near:   Left Eye Near:    Bilateral Near:     Physical Exam Vitals reviewed.  Constitutional:      Appearance: Normal  appearance.  HENT:     Head: Normocephalic and atraumatic.     Right Ear: Tympanic membrane normal.     Left Ear: Tympanic membrane normal.     Ears:     Comments: Erythema is present in the right EAC.    Nose:     Right Sinus: Maxillary sinus tenderness present.     Left Sinus: Maxillary sinus tenderness present.     Mouth/Throat:     Mouth: Mucous membranes are moist.     Pharynx: Oropharynx is clear. No oropharyngeal exudate or posterior oropharyngeal erythema.  Cardiovascular:     Rate and Rhythm: Normal rate and regular rhythm.     Pulses: Normal pulses.     Heart sounds: Normal heart sounds.  Pulmonary:     Effort: Pulmonary effort is normal.     Breath sounds: Normal breath sounds.  Skin:    General: Skin is warm and dry.  Neurological:     General: No focal deficit present.     Mental Status: She is alert and oriented to person, place, and time.  Psychiatric:        Mood and Affect: Mood normal.        Behavior: Behavior normal.      UC Treatments / Results  Labs (all labs ordered are listed, but only abnormal results are displayed) Labs Reviewed - No data to display  EKG   Radiology No results found.  Procedures Procedures (including critical care time)  Medications Ordered in UC Medications - No data to display  Initial Impression / Assessment and Plan / UC Course  I have reviewed the triage vital signs and the  nursing notes.  Pertinent labs & imaging results that were available during my care of the patient were reviewed by me and considered in my medical decision making (see chart for details).   Likely viral rhinosinusitis.  No respiratory swab was obtained due to duration of symptoms.  Nearing threshold for when we would suspect secondary bacterial infection.  Will prescribe Medrol dose pack to attempt to resolve sinus inflammation and relieve drainage and avoid antibiotic.  Benzonatate prescribed for cough.  She may continue to take  over-the-counter cough and cold formulations for relief of other symptoms.  Patient is advised to return to urgent care if symptoms do not significantly improve in a few days.  Follow-up with primary care as needed.   Final Clinical Impressions(s) / UC Diagnoses   Final diagnoses:  None   Discharge Instructions   None    ED Prescriptions   None    PDMP not reviewed this encounter.   Rose Phi, [place] 12/17/21 [phone]

## 2022-02-11 ENCOUNTER — Ambulatory Visit: Payer: BC Managed Care – PPO | Admitting: Nurse Practitioner

## 2022-02-17 ENCOUNTER — Ambulatory Visit: Payer: BC Managed Care – PPO | Admitting: Internal Medicine

## 2022-02-17 ENCOUNTER — Encounter: Payer: Self-pay | Admitting: Internal Medicine

## 2022-02-17 VITALS — BP 104/63 | HR 80 | Temp 98.2°F | Resp 16 | Ht 63.0 in | Wt 164.8 lb

## 2022-02-17 DIAGNOSIS — Z6829 Body mass index (BMI) 29.0-29.9, adult: Secondary | ICD-10-CM

## 2022-02-17 DIAGNOSIS — F5101 Primary insomnia: Secondary | ICD-10-CM | POA: Diagnosis not present

## 2022-02-17 DIAGNOSIS — F411 Generalized anxiety disorder: Secondary | ICD-10-CM | POA: Diagnosis not present

## 2022-02-17 DIAGNOSIS — Z23 Encounter for immunization: Secondary | ICD-10-CM | POA: Diagnosis not present

## 2022-02-17 MED ORDER — ESZOPICLONE 2 MG PO TABS: 2.0000 mg | ORAL_TABLET | Freq: Every evening | ORAL | 2 refills | Status: DC | PRN

## 2022-02-17 MED ORDER — PHENTERMINE HCL 15 MG PO CAPS: ORAL_CAPSULE | ORAL | 0 refills | Status: DC

## 2022-02-17 NOTE — Progress Notes (Signed)
[facility] [place], [place] [address]  Internal MEDICINE  Office Visit Note  Patient Name: Kayla Jimenez  [id number]  [id number]  Date of Service: 02/18/2022  Chief Complaint  Patient presents with   Follow-up    Weight loss    HPI Pt is here for treatment of obesity/ overweight Patient is second grade teacher at school, has not been having the excellent eating habits and food intake Does not over eat however the choices of food selections are poor Will like to have a refill on her Johnnye Sima Has been under stress due to demise of her mother this year    Current Medication: Outpatient Encounter Medications as of 02/17/2022  Medication Sig   ALPRAZolam (XANAX) 0.25 MG tablet Take 1 tablet (0.25 mg total) by mouth 2 (two) times daily as needed (severe anxiety).   benzonatate (TESSALON) 100 MG capsule Take 1-2 tablets 3 times a day as needed for cough   bisoprolol-hydrochlorothiazide (ZIAC) 2.5-6.25 MG tablet Take 1 tablet by mouth daily.   buPROPion ER (WELLBUTRIN SR) 100 MG 12 hr tablet Take 1 tablet (100 mg total) by mouth daily.   cetirizine (ZYRTEC) 10 MG tablet Take 1 tablet (10 mg total) by mouth daily.   Cranberry-Vitamin C (CRANBERRY CONCENTRATE/VITAMINC) 15000-100 MG CAPS Take by mouth.   cyanocobalamin (,VITAMIN B-12,) 1000 MCG/ML injection Inject 1 mL (1,000 mcg total) into the muscle every 30 (thirty) days. Pt needs syringes as well   fluconazole (DIFLUCAN) 150 MG tablet Take 1 tablet by mouth once and may repeat in 3 days if symptoms persist   fluticasone (FLONASE) 50 MCG/ACT nasal spray Place 2 sprays into both nostrils daily. (Patient taking differently: Place 2 sprays into both nostrils as needed.)   montelukast (SINGULAIR) 10 MG tablet TAKE 1 TABLET BY MOUTH EVERYDAY AT BEDTIME   phentermine 15 MG capsule Take one tab po qd in am   rosuvastatin (CRESTOR) 5 MG tablet TAKE 1 TABLET (5 MG TOTAL) BY MOUTH DAILY.   sertraline (ZOLOFT) 50 MG  tablet Take 1.5 tablets (75 mg total) by mouth at bedtime.   SUMAtriptan (IMITREX) 50 MG tablet Take 1 tablet (50 mg total) by mouth every 2 (two) hours as needed for migraine. May repeat in 2 hours if migraine persists or recurs.max 200 mg per 24 hours.   [DISCONTINUED] eszopiclone (LUNESTA) 2 MG TABS tablet Take 1 tablet (2 mg total) by mouth at bedtime as needed for sleep. Take immediately before bedtime   [DISCONTINUED] methylPREDNISolone (MEDROL DOSEPAK) 4 MG TBPK tablet Steroid taper. Take as directed by packaging.   eszopiclone (LUNESTA) 2 MG TABS tablet Take 1 tablet (2 mg total) by mouth at bedtime as needed for sleep. Take immediately before bedtime   No facility-administered encounter medications on file as of 02/17/2022.    Surgical History: Past Surgical History:  Procedure Laterality Date   ABDOMINAL HYSTERECTOMY     ANTERIOR AND POSTERIOR REPAIR  03/16/2012   Procedure: ANTERIOR (CYSTOCELE) AND POSTERIOR REPAIR (RECTOCELE);  Surgeon: [name], MD;  Location: [facility];  Service: Gynecology;  Laterality: N/A;  anterior repair/cysto   BLADDER SUSPENSION  03/16/2012   Procedure: TRANSVAGINAL TAPE (TVT) PROCEDURE;  Surgeon: [name], MD;  Location: [facility];  Service: Gynecology;  Laterality: N/A;   BREAST REDUCTION SURGERY  6/04   CHOLECYSTECTOMY  06/28/14   CYSTOSCOPY  03/16/2012   Procedure: CYSTOSCOPY;  Surgeon: [name], MD;  Location: [facility];  Service: Gynecology;  Laterality:  N/A;   CYSTOSCOPY  03/16/2017   Procedure: CYSTOSCOPY;  Surgeon: Everett Graff, MD;  Location: [facility];  Service: Gynecology;;   [place]  03/16/2012   Procedure: DILATATION & CURETTAGE/HYSTEROSCOPY WITH NOVASURE ABLATION;  Surgeon: [name], MD;  Location: [facility];  Service: Gynecology;  Laterality: N/A;   EXTRACORPOREAL SHOCK WAVE LITHOTRIPSY Right 07/18/2021   Procedure: EXTRACORPOREAL SHOCK WAVE LITHOTRIPSY (ESWL);   Surgeon: Billey Co, MD;  Location: [facility];  Service: Urology;  Laterality: Right;   TONSILLECTOMY AND ADENOIDECTOMY     VAGINAL HYSTERECTOMY Bilateral 03/16/2017   Procedure: HYSTERECTOMY VAGINAL Bilateral Salpingectomy ;  Surgeon: Everett Graff, MD;  Location: [facility];  Service: Gynecology;  Laterality: Bilateral;    Medical History: Past Medical History:  Diagnosis Date   Anxiety 08/29/10   Bursitis    left foot   BV (bacterial vaginosis) 0/35/00   Complication of anesthesia 2004   vomited   Gross hematuria 07/08/2015   H/O bladder infections    H/O mumps    H/O toxoplasmosis    H/O varicella    History of kidney stones    Infections of kidney    several kidney stones, bladder infections   Kidney disease 09/03/2011   Left ureteral calculus with partial obstruction, passed 06/09/2014. CT did show perinephric stranding.    Menometrorrhagia 06/26/10   Plantar fasciitis    PMS (premenstrual syndrome) 07/10/11   Post - coital bleeding 08/29/10   Renal calculus, left    Sinus infection    UTI (lower urinary tract infection) 07/08/2015   Yeast infection     Family History: Family History  Problem Relation Age of Onset   Cancer Other        breast   Cancer Mother        cervical   Depression Mother    Drug abuse Mother    Anemia Mother        blood transfusion   Migraines Mother     Social History   Socioeconomic History   Marital status: Married    Spouse name: Not on file   Number of children: Not on file   Years of education: Not on file   Highest education level: Not on file  Occupational History   Not on file  Tobacco Use   Smoking status: Never   Smokeless tobacco: Never  Vaping Use   Vaping Use: Never used  Substance and Sexual Activity   Alcohol use: No   Drug use: No   Sexual activity: Yes    Birth control/protection: Surgical    Comment: pt spouse had vasec.   Other Topics Concern   Not on file  Social History Narrative   Not on file    Social Determinants of Health   Financial Resource Strain: Not on file  Food Insecurity: Not on file  Transportation Needs: Not on file  Physical Activity: Not on file  Stress: Not on file  Social Connections: Not on file  Intimate Partner Violence: Not on file      Review of Systems  Constitutional:  Negative for chills, fatigue and unexpected weight change.  HENT:  Positive for postnasal drip. Negative for congestion, rhinorrhea, sneezing and sore throat.   Eyes:  Negative for redness.  Respiratory:  Negative for cough, chest tightness and shortness of breath.   Cardiovascular:  Negative for chest pain and palpitations.  Gastrointestinal:  Negative for abdominal pain, constipation, diarrhea, nausea and vomiting.  Genitourinary:  Negative for dysuria and frequency.  Musculoskeletal:  Negative for arthralgias, back pain, joint swelling and neck pain.  Skin:  Negative for rash.  Neurological: Negative.  Negative for tremors and numbness.  Hematological:  Negative for adenopathy. Does not bruise/bleed easily.  Psychiatric/Behavioral:  Negative for behavioral problems (Depression), sleep disturbance and suicidal ideas. The patient is not nervous/anxious.     Vital Signs: BP 104/63   Pulse 80   Temp 98.2 F (36.8 C)   Resp 16   Ht '5\' 3"'$  (1.6 m)   Wt 164 lb 12.8 oz (74.8 kg)   LMP 02/23/2017 (Exact Date)   SpO2 98%   BMI 29.19 kg/m    Physical Exam Constitutional:      Appearance: Normal appearance.  HENT:     Head: Normocephalic and atraumatic.     Nose: Nose normal.     Mouth/Throat:     Mouth: Mucous membranes are moist.     Pharynx: No posterior oropharyngeal erythema.  Eyes:     Extraocular Movements: Extraocular movements intact.     Pupils: Pupils are equal, round, and reactive to light.  Cardiovascular:     Pulses: Normal pulses.     Heart sounds: Normal heart sounds.  Pulmonary:     Effort: Pulmonary effort is normal.     Breath sounds: Normal breath  sounds.  Neurological:     General: No focal deficit present.     Mental Status: She is alert.  Psychiatric:        Mood and Affect: Mood normal.        Behavior: Behavior normal.        Assessment/Plan: 1. Primary insomnia Refill her Lunesta today for ongoing insomnia - eszopiclone (LUNESTA) 2 MG TABS tablet; Take 1 tablet (2 mg total) by mouth at bedtime as needed for sleep. Take immediately before bedtime  Dispense: 30 tablet; Refill: 2  2. BMI 29.0-29.9,adult Obesity Counseling: Risk Assessment: An assessment of behavioral risk factors was made today and includes lack of exercise sedentary lifestyle, lack of portion control and poor dietary habits.  Risk Modification Advice: She was counseled on portion control guidelines. Restricting daily caloric intake to 1200. The detrimental long term effects of obesity on her health and ongoing poor compliance was also discussed with the patient. Start phentermine 15 mg once a day in the morning, depending on her response to therapy might need to add Topamax, which will also help with her migraine headaches side effect discussed and limited liability release is signed by the patient  3. GAD (generalized anxiety disorder) We will DC Zoloft for now continue on Wellbutrin 100 mg once a day might need to increase in future  4. Flu vaccine need - Flu Vaccine MDCK QUAD PF   General Counseling: Kayla Jimenez verbalizes understanding of the findings of todays visit and agrees with plan of treatment. I have discussed any further diagnostic evaluation that may be needed or ordered today. We also reviewed her medications today. she has been encouraged to call the office with any questions or concerns that should arise related to todays visit.    Orders Placed This Encounter  Procedures   Flu Vaccine MDCK QUAD PF    Meds ordered this encounter  Medications   phentermine 15 MG capsule    Sig: Take one tab po qd in am    Dispense:  30 capsule     Refill:  0   eszopiclone (LUNESTA) 2 MG TABS tablet    Sig: Take  1 tablet (2 mg total) by mouth at bedtime as needed for sleep. Take immediately before bedtime    Dispense:  30 tablet    Refill:  2    Total time spent:35 Minutes Time spent includes review of chart, medications, test results, and follow up plan with the patient.   [place] Controlled Substance Database was reviewed by me.   Dr Lavera Guise Internal medicine

## 2022-02-24 ENCOUNTER — Encounter: Payer: Self-pay | Admitting: Internal Medicine

## 2022-04-12 ENCOUNTER — Other Ambulatory Visit: Payer: Self-pay | Admitting: Internal Medicine

## 2022-04-12 DIAGNOSIS — I10 Essential (primary) hypertension: Secondary | ICD-10-CM

## 2022-04-13 ENCOUNTER — Other Ambulatory Visit: Payer: Self-pay | Admitting: Nurse Practitioner

## 2022-04-13 DIAGNOSIS — F321 Major depressive disorder, single episode, moderate: Secondary | ICD-10-CM

## 2022-04-14 ENCOUNTER — Encounter: Payer: BC Managed Care – PPO | Admitting: Nurse Practitioner

## 2022-04-18 ENCOUNTER — Telehealth: Payer: Self-pay | Admitting: Nurse Practitioner

## 2022-04-18 NOTE — Telephone Encounter (Signed)
Left vm and sent mychart message to confirm 04/24/22 appointment-Toni

## 2022-04-21 ENCOUNTER — Ambulatory Visit (INDEPENDENT_AMBULATORY_CARE_PROVIDER_SITE_OTHER): Payer: BC Managed Care – PPO | Admitting: Nurse Practitioner

## 2022-04-21 ENCOUNTER — Encounter: Payer: Self-pay | Admitting: Nurse Practitioner

## 2022-04-21 VITALS — BP 128/80 | HR 70 | Temp 98.0°F | Resp 16 | Ht 63.0 in | Wt 168.0 lb

## 2022-04-21 DIAGNOSIS — F321 Major depressive disorder, single episode, moderate: Secondary | ICD-10-CM

## 2022-04-21 DIAGNOSIS — R635 Abnormal weight gain: Secondary | ICD-10-CM

## 2022-04-21 DIAGNOSIS — E663 Overweight: Secondary | ICD-10-CM | POA: Diagnosis not present

## 2022-04-21 DIAGNOSIS — F5101 Primary insomnia: Secondary | ICD-10-CM | POA: Diagnosis not present

## 2022-04-21 DIAGNOSIS — Z6829 Body mass index (BMI) 29.0-29.9, adult: Secondary | ICD-10-CM

## 2022-04-21 MED ORDER — BUPROPION HCL ER (SR) 100 MG PO TB12: 100.0000 mg | ORAL_TABLET | Freq: Two times a day (BID) | ORAL | 1 refills | Status: DC

## 2022-04-21 MED ORDER — ESZOPICLONE 2 MG PO TABS: 2.0000 mg | ORAL_TABLET | Freq: Every evening | ORAL | 2 refills | Status: DC | PRN

## 2022-04-21 NOTE — Progress Notes (Signed)
[facility] [place], [place] [address]  Internal MEDICINE  Office Visit Note  Patient Name: Kayla Jimenez  [id number]  [id number]  Date of Service: 04/21/2022  Chief Complaint  Patient presents with   Anxiety    HPI Kayla Jimenez presents for a follow-up visit for weight loss management, depression, and insomnia.  Weight loss management -- phentermine was prescribed at prior visit, insurance did not cover, was too expensive OOP. Discussed medication side effects and precautions. Patient still willing to try, is cheaper with goodrx discount card. Patient long-term goal weight is 130-135 lbs, short term goal is 150-155 lbs for now.  Depression -- currently taking bupropion 100 mg once daily.  Insomnia -- takes lunesta, remains effective per patient.      Current Medication: Outpatient Encounter Medications as of 04/21/2022  Medication Sig   ALPRAZolam (XANAX) 0.25 MG tablet Take 1 tablet (0.25 mg total) by mouth 2 (two) times daily as needed (severe anxiety).   benzonatate (TESSALON) 100 MG capsule Take 1-2 tablets 3 times a day as needed for cough   bisoprolol-hydrochlorothiazide (ZIAC) 2.5-6.25 MG tablet TAKE 1 TABLET BY MOUTH EVERY DAY   buPROPion ER (WELLBUTRIN SR) 100 MG 12 hr tablet Take 1 tablet (100 mg total) by mouth 2 (two) times daily.   cetirizine (ZYRTEC) 10 MG tablet Take 1 tablet (10 mg total) by mouth daily.   Cranberry-Vitamin C (CRANBERRY CONCENTRATE/VITAMINC) 15000-100 MG CAPS Take by mouth.   cyanocobalamin (,VITAMIN B-12,) 1000 MCG/ML injection Inject 1 mL (1,000 mcg total) into the muscle every 30 (thirty) days. Pt needs syringes as well   eszopiclone (LUNESTA) 2 MG TABS tablet Take 1 tablet (2 mg total) by mouth at bedtime as needed for sleep. Take immediately before bedtime   fluconazole (DIFLUCAN) 150 MG tablet Take 1 tablet by mouth once and may repeat in 3 days if symptoms persist   fluticasone (FLONASE) 50 MCG/ACT nasal spray Place 2  sprays into both nostrils daily. (Patient taking differently: Place 2 sprays into both nostrils as needed.)   montelukast (SINGULAIR) 10 MG tablet TAKE 1 TABLET BY MOUTH EVERYDAY AT BEDTIME   phentermine 15 MG capsule Take one tab po qd in am   rosuvastatin (CRESTOR) 5 MG tablet TAKE 1 TABLET (5 MG TOTAL) BY MOUTH DAILY.   SUMAtriptan (IMITREX) 50 MG tablet Take 1 tablet (50 mg total) by mouth every 2 (two) hours as needed for migraine. May repeat in 2 hours if migraine persists or recurs.max 200 mg per 24 hours.   [DISCONTINUED] buPROPion ER (WELLBUTRIN SR) 100 MG 12 hr tablet TAKE 1 TABLET BY MOUTH EVERY DAY   [DISCONTINUED] eszopiclone (LUNESTA) 2 MG TABS tablet Take 1 tablet (2 mg total) by mouth at bedtime as needed for sleep. Take immediately before bedtime   No facility-administered encounter medications on file as of 04/21/2022.    Surgical History: Past Surgical History:  Procedure Laterality Date   ABDOMINAL HYSTERECTOMY     ANTERIOR AND POSTERIOR REPAIR  03/16/2012   Procedure: ANTERIOR (CYSTOCELE) AND POSTERIOR REPAIR (RECTOCELE);  Surgeon: [name], MD;  Location: [facility];  Service: Gynecology;  Laterality: N/A;  anterior repair/cysto   BLADDER SUSPENSION  03/16/2012   Procedure: TRANSVAGINAL TAPE (TVT) PROCEDURE;  Surgeon: [name], MD;  Location: [facility];  Service: Gynecology;  Laterality: N/A;   BREAST REDUCTION SURGERY  6/04   CHOLECYSTECTOMY  06/28/14   CYSTOSCOPY  03/16/2012   Procedure: CYSTOSCOPY;  Surgeon: [name],  MD;  Location: [facility];  Service: Gynecology;  Laterality: N/A;   CYSTOSCOPY  03/16/2017   Procedure: CYSTOSCOPY;  Surgeon: Everett Graff, MD;  Location: [facility];  Service: Gynecology;;   [place]  03/16/2012   Procedure: DILATATION & CURETTAGE/HYSTEROSCOPY WITH NOVASURE ABLATION;  Surgeon: [name], MD;  Location: [facility];  Service: Gynecology;  Laterality: N/A;   EXTRACORPOREAL  SHOCK WAVE LITHOTRIPSY Right 07/18/2021   Procedure: EXTRACORPOREAL SHOCK WAVE LITHOTRIPSY (ESWL);  Surgeon: Billey Co, MD;  Location: [facility];  Service: Urology;  Laterality: Right;   TONSILLECTOMY AND ADENOIDECTOMY     VAGINAL HYSTERECTOMY Bilateral 03/16/2017   Procedure: HYSTERECTOMY VAGINAL Bilateral Salpingectomy ;  Surgeon: Everett Graff, MD;  Location: [facility];  Service: Gynecology;  Laterality: Bilateral;    Medical History: Past Medical History:  Diagnosis Date   Anxiety 08/29/10   Bursitis    left foot   BV (bacterial vaginosis) 0/93/26   Complication of anesthesia 2004   vomited   Gross hematuria 07/08/2015   H/O bladder infections    H/O mumps    H/O toxoplasmosis    H/O varicella    History of kidney stones    Infections of kidney    several kidney stones, bladder infections   Kidney disease 09/03/2011   Left ureteral calculus with partial obstruction, passed 06/09/2014. CT did show perinephric stranding.    Menometrorrhagia 06/26/10   Plantar fasciitis    PMS (premenstrual syndrome) 07/10/11   Post - coital bleeding 08/29/10   Renal calculus, left    Sinus infection    UTI (lower urinary tract infection) 07/08/2015   Yeast infection     Family History: Family History  Problem Relation Age of Onset   Cancer Other        breast   Cancer Mother        cervical   Depression Mother    Drug abuse Mother    Anemia Mother        blood transfusion   Migraines Mother     Social History   Socioeconomic History   Marital status: Married    Spouse name: Not on file   Number of children: Not on file   Years of education: Not on file   Highest education level: Not on file  Occupational History   Not on file  Tobacco Use   Smoking status: Never   Smokeless tobacco: Never  Vaping Use   Vaping Use: Never used  Substance and Sexual Activity   Alcohol use: No   Drug use: No   Sexual activity: Yes    Birth control/protection: Surgical    Comment: pt  spouse had vasec.   Other Topics Concern   Not on file  Social History Narrative   Not on file   Social Determinants of Health   Financial Resource Strain: Not on file  Food Insecurity: Not on file  Transportation Needs: Not on file  Physical Activity: Not on file  Stress: Not on file  Social Connections: Not on file  Intimate Partner Violence: Not on file      Review of Systems  Constitutional:  Positive for fatigue. Negative for chills and unexpected weight change.  HENT: Negative.  Negative for congestion, rhinorrhea, sneezing and sore throat.   Eyes:  Negative for redness.  Respiratory: Negative.  Negative for cough, chest tightness, shortness of breath and wheezing.   Cardiovascular: Negative.  Negative for chest pain and palpitations.  Gastrointestinal:  Negative for abdominal pain, constipation, diarrhea, nausea and vomiting.  Genitourinary:  Negative for dysuria and frequency.  Musculoskeletal:  Negative for arthralgias, back pain, joint swelling and neck pain.  Skin:  Negative for rash.  Neurological: Negative.  Negative for tremors and numbness.  Hematological:  Negative for adenopathy. Does not bruise/bleed easily.  Psychiatric/Behavioral:  Positive for behavioral problems (Depression -- takes bupropion), decreased concentration and sleep disturbance (takes lunesta). Negative for self-injury and suicidal ideas. The patient is nervous/anxious.     Vital Signs: BP 128/80   Pulse 70   Temp 98 F (36.7 C)   Resp 16   Ht '5\' 3"'$  (1.6 m)   Wt 168 lb (76.2 kg)   LMP 02/23/2017 (Exact Date)   SpO2 99%   BMI 29.76 kg/m    Physical Exam Vitals reviewed.  Constitutional:      General: She is not in acute distress.    Appearance: Normal appearance. She is well-developed, well-groomed and overweight. She is not ill-appearing.  HENT:     Head: Normocephalic and atraumatic.  Eyes:     Pupils: Pupils are equal, round, and reactive to light.  Cardiovascular:     Rate  and Rhythm: Normal rate and regular rhythm.  Pulmonary:     Effort: Pulmonary effort is normal. No respiratory distress.  Neurological:     Mental Status: She is alert and oriented to person, place, and time.  Psychiatric:        Mood and Affect: Mood normal.        Behavior: Behavior normal.        Assessment/Plan: 1. Abnormal weight gain Patient will fill phentermine prescription and take x1 month then follow up in 4 weeks. Goodrx coupon provided to patient.   2. Primary insomnia Continue lunesta as prescribed, refills ordered.  - eszopiclone (LUNESTA) 2 MG TABS tablet; Take 1 tablet (2 mg total) by mouth at bedtime as needed for sleep. Take immediately before bedtime  Dispense: 30 tablet; Refill: 2  3. Moderate major depression (HCC) Bupropion increased to twice daily, will see if this improves depressive symptoms, may also help with weight loss. Will reassess in 4 weeks.  - buPROPion ER (WELLBUTRIN SR) 100 MG 12 hr tablet; Take 1 tablet (100 mg total) by mouth 2 (two) times daily.  Dispense: 180 tablet; Refill: 1  4. Overweight with body mass index (BMI) of 29 to 29.9 in adult Stat phentermine, take as prescribed, follow up in 4 weeks.   General Counseling: Kayla Jimenez verbalizes understanding of the findings of todays visit and agrees with plan of treatment. I have discussed any further diagnostic evaluation that may be needed or ordered today. We also reviewed her medications today. she has been encouraged to call the office with any questions or concerns that should arise related to todays visit.    No orders of the defined types were placed in this encounter.   Meds ordered this encounter  Medications   eszopiclone (LUNESTA) 2 MG TABS tablet    Sig: Take 1 tablet (2 mg total) by mouth at bedtime as needed for sleep. Take immediately before bedtime    Dispense:  30 tablet    Refill:  2   buPROPion ER (WELLBUTRIN SR) 100 MG 12 hr tablet    Sig: Take 1 tablet (100 mg total)  by mouth 2 (two) times daily.    Dispense:  180 tablet    Refill:  1    Please note increased dose and change prescription  prior to patient picking it up, thanks.    Return in about 4 weeks (around 05/19/2022) for F/U, Weight loss, Balraj Brayfield PCP.   Total time spent:30 Minutes Time spent includes review of chart, medications, test results, and follow up plan with the patient.   [place] Controlled Substance Database was reviewed by me.  This patient was seen by Jonetta Osgood, FNP-C in collaboration with Dr. Clayborn Bigness as a part of collaborative care agreement.   Leiam Hopwood R. Valetta Fuller, MSN, FNP-C Internal medicine

## 2022-04-24 ENCOUNTER — Encounter: Payer: BC Managed Care – PPO | Admitting: Nurse Practitioner

## 2022-04-29 ENCOUNTER — Telehealth: Payer: Self-pay

## 2022-04-29 NOTE — Telephone Encounter (Addendum)
Completed P.A. for patient's Phentermine and it was approved.

## 2022-05-07 ENCOUNTER — Other Ambulatory Visit: Payer: Self-pay | Admitting: Nurse Practitioner

## 2022-05-07 DIAGNOSIS — J301 Allergic rhinitis due to pollen: Secondary | ICD-10-CM

## 2022-05-07 DIAGNOSIS — E782 Mixed hyperlipidemia: Secondary | ICD-10-CM

## 2022-05-19 ENCOUNTER — Encounter: Payer: Self-pay | Admitting: Nurse Practitioner

## 2022-05-19 ENCOUNTER — Ambulatory Visit (INDEPENDENT_AMBULATORY_CARE_PROVIDER_SITE_OTHER): Payer: BC Managed Care – PPO | Admitting: Nurse Practitioner

## 2022-05-19 VITALS — BP 125/78 | HR 70 | Temp 98.2°F | Resp 16 | Ht 63.0 in | Wt 163.6 lb

## 2022-05-19 DIAGNOSIS — R635 Abnormal weight gain: Secondary | ICD-10-CM

## 2022-05-19 DIAGNOSIS — E663 Overweight: Secondary | ICD-10-CM | POA: Diagnosis not present

## 2022-05-19 DIAGNOSIS — Z6829 Body mass index (BMI) 29.0-29.9, adult: Secondary | ICD-10-CM | POA: Diagnosis not present

## 2022-05-19 DIAGNOSIS — Z0001 Encounter for general adult medical examination with abnormal findings: Secondary | ICD-10-CM

## 2022-05-19 MED ORDER — PHENTERMINE HCL 15 MG PO CAPS: ORAL_CAPSULE | ORAL | 0 refills | Status: DC

## 2022-05-19 NOTE — Progress Notes (Signed)
[facility], [place] [address]  Internal MEDICINE  Office Visit Note  Patient Name: Kayla Jimenez  [id number]  [id number]  Date of Service: 05/19/2022  Chief Complaint  Patient presents with   Follow-up    HPI Kayla Jimenez presents for an annual well visit and physical exam.  Well-appearing 47 y.o. female with allergic rhinitis, kidney stones, gallstones, fatigue and insomnia.  Routine CRC screening: due in 2032 Routine mammogram: done in November 2023 Pap smear: done with OBGYN November 2023 Labs: defer until summer per patient request New or worsening pain: none  Other concerns: none Lost 5 lbs with phentermine.      04/21/2022    8:43 AM  Depression screen PHQ 2/9  Decreased Interest 1  Down, Depressed, Hopeless 0  PHQ - 2 Score 1     Current Medication: Outpatient Encounter Medications as of 05/19/2022  Medication Sig   ALPRAZolam (XANAX) 0.25 MG tablet Take 1 tablet (0.25 mg total) by mouth 2 (two) times daily as needed (severe anxiety).   benzonatate (TESSALON) 100 MG capsule Take 1-2 tablets 3 times a day as needed for cough   bisoprolol-hydrochlorothiazide (ZIAC) 2.5-6.25 MG tablet TAKE 1 TABLET BY MOUTH EVERY DAY   buPROPion ER (WELLBUTRIN SR) 100 MG 12 hr tablet Take 1 tablet (100 mg total) by mouth 2 (two) times daily.   cetirizine (ZYRTEC) 10 MG tablet Take 1 tablet (10 mg total) by mouth daily.   Cranberry-Vitamin C (CRANBERRY CONCENTRATE/VITAMINC) 15000-100 MG CAPS Take by mouth.   cyanocobalamin (,VITAMIN B-12,) 1000 MCG/ML injection Inject 1 mL (1,000 mcg total) into the muscle every 30 (thirty) days. Pt needs syringes as well   eszopiclone (LUNESTA) 2 MG TABS tablet Take 1 tablet (2 mg total) by mouth at bedtime as needed for sleep. Take immediately before bedtime   fluconazole (DIFLUCAN) 150 MG tablet Take 1 tablet by mouth once and may repeat in 3 days if symptoms persist   fluticasone (FLONASE) 50 MCG/ACT nasal spray Place  2 sprays into both nostrils daily. (Patient taking differently: Place 2 sprays into both nostrils as needed.)   montelukast (SINGULAIR) 10 MG tablet TAKE 1 TABLET BY MOUTH EVERYDAY AT BEDTIME   rosuvastatin (CRESTOR) 5 MG tablet TAKE 1 TABLET (5 MG TOTAL) BY MOUTH DAILY.   SUMAtriptan (IMITREX) 50 MG tablet Take 1 tablet (50 mg total) by mouth every 2 (two) hours as needed for migraine. May repeat in 2 hours if migraine persists or recurs.max 200 mg per 24 hours.   [DISCONTINUED] phentermine 15 MG capsule Take one tab po qd in am   phentermine 15 MG capsule Take one tab po qd in am   No facility-administered encounter medications on file as of 05/19/2022.    Surgical History: Past Surgical History:  Procedure Laterality Date   ABDOMINAL HYSTERECTOMY     ANTERIOR AND POSTERIOR REPAIR  03/16/2012   Procedure: ANTERIOR (CYSTOCELE) AND POSTERIOR REPAIR (RECTOCELE);  Surgeon: [name], MD;  Location: [facility];  Service: Gynecology;  Laterality: N/A;  anterior repair/cysto   BLADDER SUSPENSION  03/16/2012   Procedure: TRANSVAGINAL TAPE (TVT) PROCEDURE;  Surgeon: [name], MD;  Location: [facility];  Service: Gynecology;  Laterality: N/A;   BREAST REDUCTION SURGERY  6/04   CHOLECYSTECTOMY  06/28/14   CYSTOSCOPY  03/16/2012   Procedure: CYSTOSCOPY;  Surgeon: [name], MD;  Location: [facility];  Service: Gynecology;  Laterality: N/A;   CYSTOSCOPY  03/16/2017   Procedure: CYSTOSCOPY;  Surgeon: Everett Graff, MD;  Location: [facility];  Service: Gynecology;;   [place]  03/16/2012   Procedure: DILATATION & CURETTAGE/HYSTEROSCOPY WITH NOVASURE ABLATION;  Surgeon: [name], MD;  Location: [facility];  Service: Gynecology;  Laterality: N/A;   EXTRACORPOREAL SHOCK WAVE LITHOTRIPSY Right 07/18/2021   Procedure: EXTRACORPOREAL SHOCK WAVE LITHOTRIPSY (ESWL);  Surgeon: Billey Co, MD;  Location: [facility];  Service: Urology;  Laterality:  Right;   TONSILLECTOMY AND ADENOIDECTOMY     VAGINAL HYSTERECTOMY Bilateral 03/16/2017   Procedure: HYSTERECTOMY VAGINAL Bilateral Salpingectomy ;  Surgeon: Everett Graff, MD;  Location: [facility];  Service: Gynecology;  Laterality: Bilateral;    Medical History: Past Medical History:  Diagnosis Date   Anxiety 08/29/10   Bursitis    left foot   BV (bacterial vaginosis) [id number]   Complication of anesthesia 2004   vomited   Gross hematuria 07/08/2015   H/O bladder infections    H/O mumps    H/O toxoplasmosis    H/O varicella    History of kidney stones    Infections of kidney    several kidney stones, bladder infections   Kidney disease 09/03/2011   Left ureteral calculus with partial obstruction, passed 06/09/2014. CT did show perinephric stranding.    Menometrorrhagia 06/26/10   Plantar fasciitis    PMS (premenstrual syndrome) 07/10/11   Post - coital bleeding 08/29/10   Renal calculus, left    Sinus infection    UTI (lower urinary tract infection) 07/08/2015   Yeast infection     Family History: Family History  Problem Relation Age of Onset   Cancer Other        breast   Cancer Mother        cervical   Depression Mother    Drug abuse Mother    Anemia Mother        blood transfusion   Migraines Mother     Social History   Socioeconomic History   Marital status: Married    Spouse name: Not on file   Number of children: Not on file   Years of education: Not on file   Highest education level: Not on file  Occupational History   Not on file  Tobacco Use   Smoking status: Never   Smokeless tobacco: Never  Vaping Use   Vaping Use: Never used  Substance and Sexual Activity   Alcohol use: No   Drug use: No   Sexual activity: Yes    Birth control/protection: Surgical    Comment: pt spouse had vasec.   Other Topics Concern   Not on file  Social History Narrative   Not on file   Social Determinants of Health   Financial Resource Strain: Not on file  Food  Insecurity: Not on file  Transportation Needs: Not on file  Physical Activity: Not on file  Stress: Not on file  Social Connections: Not on file  Intimate Partner Violence: Not on file      Review of Systems  Constitutional:  Negative for activity change, appetite change, chills, fatigue, fever and unexpected weight change.  HENT: Negative.  Negative for congestion, ear pain, rhinorrhea, sore throat and trouble swallowing.   Eyes: Negative.   Respiratory: Negative.  Negative for cough, chest tightness, shortness of breath and wheezing.   Cardiovascular: Negative.  Negative for chest pain.  Gastrointestinal: Negative.  Negative for abdominal pain, blood in stool, constipation, diarrhea, nausea and vomiting.  Endocrine: Negative.  Genitourinary: Negative.  Negative for difficulty urinating, dysuria, frequency, hematuria and urgency.  Musculoskeletal: Negative.  Negative for arthralgias, back pain, joint swelling, myalgias and neck pain.  Skin: Negative.  Negative for rash and wound.  Allergic/Immunologic: Negative.  Negative for immunocompromised state.  Neurological: Negative.  Negative for dizziness, seizures, numbness and headaches.  Hematological: Negative.   Psychiatric/Behavioral: Negative.  Negative for behavioral problems, self-injury and suicidal ideas. The patient is not nervous/anxious.     Vital Signs: BP 125/78   Pulse 70   Temp 98.2 F (36.8 C)   Resp 16   Ht '5\' 3"'$  (1.6 m)   Wt 163 lb 9.6 oz (74.2 kg)   LMP 02/23/2017 (Exact Date)   SpO2 98%   BMI 28.98 kg/m    Physical Exam Vitals reviewed.  Constitutional:      General: She is awake. She is not in acute distress.    Appearance: Normal appearance. She is well-developed, well-groomed and overweight. She is not ill-appearing or diaphoretic.  HENT:     Head: Normocephalic and atraumatic.     Right Ear: Tympanic membrane, ear canal and external ear normal.     Left Ear: Tympanic membrane, ear canal and  external ear normal.     Nose: Nose normal. No congestion or rhinorrhea.     Mouth/Throat:     Lips: Pink.     Mouth: Mucous membranes are moist.     Pharynx: Oropharynx is clear. Uvula midline. No oropharyngeal exudate or posterior oropharyngeal erythema.  Eyes:     General: Lids are normal. Vision grossly intact. Gaze aligned appropriately. No scleral icterus.       Right eye: No discharge.        Left eye: No discharge.     Extraocular Movements: Extraocular movements intact.     Conjunctiva/sclera: Conjunctivae normal.     Pupils: Pupils are equal, round, and reactive to light.     Funduscopic exam:    Right eye: Red reflex present.        Left eye: Red reflex present. Neck:     Thyroid: No thyromegaly.     Vascular: No carotid bruit or JVD.     Trachea: Trachea and phonation normal. No tracheal deviation.  Cardiovascular:     Rate and Rhythm: Normal rate and regular rhythm.     Pulses: Normal pulses.     Heart sounds: Normal heart sounds, S1 normal and S2 normal. No murmur heard.    No friction rub. No gallop.  Pulmonary:     Effort: Pulmonary effort is normal. No accessory muscle usage or respiratory distress.     Breath sounds: Normal breath sounds and air entry. No stridor. No wheezing or rales.  Chest:     Chest wall: No tenderness.     Comments: Declined clinical breast exam Abdominal:     General: Bowel sounds are normal. There is no distension.     Palpations: Abdomen is soft. There is no shifting dullness, fluid wave, mass or pulsatile mass.     Tenderness: There is no abdominal tenderness. There is no guarding or rebound.  Musculoskeletal:        General: No tenderness or deformity. Normal range of motion.     Cervical back: Normal range of motion and neck supple.     Right lower leg: No edema.     Left lower leg: No edema.  Lymphadenopathy:     Cervical: No cervical adenopathy.  Skin:    General: Skin  is warm and dry.     Capillary Refill: Capillary refill  takes less than 2 seconds.     Coloration: Skin is not pale.     Findings: No erythema or rash.  Neurological:     Mental Status: She is alert and oriented to person, place, and time.     Cranial Nerves: No cranial nerve deficit.     Motor: No abnormal muscle tone.     Coordination: Coordination normal.     Gait: Gait normal.     Deep Tendon Reflexes: Reflexes are normal and symmetric.  Psychiatric:        Mood and Affect: Mood normal.        Behavior: Behavior normal. Behavior is cooperative.        Thought Content: Thought content normal.        Judgment: Judgment normal.        Assessment/Plan: 1. Encounter for routine adult health examination with abnormal findings Age-appropriate preventive screenings and vaccinations discussed, annual physical exam completed. Routine labs for health maintenance deferred. PHM updated.   2. Abnormal weight gain Continue lower dose of phentermine - phentermine 15 MG capsule; Take one tab po qd in am  Dispense: 30 capsule; Refill: 0  3. Overweight with body mass index (BMI) of 29 to 29.9 in adult Try lower dose of phentermine - phentermine 15 MG capsule; Take one tab po qd in am  Dispense: 30 capsule; Refill: 0     General Counseling: Chiante verbalizes understanding of the findings of todays visit and agrees with plan of treatment. I have discussed any further diagnostic evaluation that may be needed or ordered today. We also reviewed her medications today. she has been encouraged to call the office with any questions or concerns that should arise related to todays visit.    No orders of the defined types were placed in this encounter.   Meds ordered this encounter  Medications   phentermine 15 MG capsule    Sig: Take one tab po qd in am    Dispense:  30 capsule    Refill:  0    Return in about 4 weeks (around 06/16/2022) for F/U, Weight loss, Aritzel Krusemark PCP.   Total time spent:30 Minutes Time spent includes review of chart,  medications, test results, and follow up plan with the patient.   [place] Controlled Substance Database was reviewed by me.  This patient was seen by Jonetta Osgood, FNP-C in collaboration with Dr. Clayborn Bigness as a part of collaborative care agreement.  Jariana Shumard R. Valetta Fuller, MSN, FNP-C Internal medicine

## 2022-05-30 ENCOUNTER — Encounter: Payer: Self-pay | Admitting: Nurse Practitioner

## 2022-06-18 ENCOUNTER — Ambulatory Visit: Payer: BC Managed Care – PPO | Admitting: Nurse Practitioner

## 2022-06-25 ENCOUNTER — Encounter: Payer: Self-pay | Admitting: Nurse Practitioner

## 2022-06-25 ENCOUNTER — Ambulatory Visit (INDEPENDENT_AMBULATORY_CARE_PROVIDER_SITE_OTHER): Payer: BC Managed Care – PPO | Admitting: Nurse Practitioner

## 2022-06-25 VITALS — BP 96/65 | HR 82 | Temp 98.1°F | Resp 16 | Ht 63.0 in | Wt 158.4 lb

## 2022-06-25 DIAGNOSIS — R635 Abnormal weight gain: Secondary | ICD-10-CM | POA: Diagnosis not present

## 2022-06-25 DIAGNOSIS — E663 Overweight: Secondary | ICD-10-CM

## 2022-06-25 DIAGNOSIS — F5101 Primary insomnia: Secondary | ICD-10-CM

## 2022-06-25 DIAGNOSIS — Z6829 Body mass index (BMI) 29.0-29.9, adult: Secondary | ICD-10-CM | POA: Diagnosis not present

## 2022-06-25 MED ORDER — PHENTERMINE HCL 37.5 MG PO TABS: 37.5000 mg | ORAL_TABLET | Freq: Every day | ORAL | 0 refills | Status: DC

## 2022-06-25 MED ORDER — ESZOPICLONE 2 MG PO TABS: 2.0000 mg | ORAL_TABLET | Freq: Every evening | ORAL | 2 refills | Status: DC | PRN

## 2022-06-25 NOTE — Progress Notes (Signed)
[facility] [place], [place] [address]  Internal MEDICINE  Office Visit Note  Patient Name: Kayla Jimenez  [id number]  [id number]  Date of Service: 06/25/2022  Chief Complaint  Patient presents with   Follow-up    Weight loss    HPI Toini presents for a follow-up visit for insomnia and weight loss.  Weight loss -- lost 5 lbs, wants to continue taking phentermine but the capsule is hard to get in stock, needs to change to the tablet.  Need refills of lunesta which helps her sleep well.     Current Medication: Outpatient Encounter Medications as of 06/25/2022  Medication Sig   benzonatate (TESSALON) 100 MG capsule Take 1-2 tablets 3 times a day as needed for cough   bisoprolol-hydrochlorothiazide (ZIAC) 2.5-6.25 MG tablet TAKE 1 TABLET BY MOUTH EVERY DAY   buPROPion ER (WELLBUTRIN SR) 100 MG 12 hr tablet Take 1 tablet (100 mg total) by mouth 2 (two) times daily.   cetirizine (ZYRTEC) 10 MG tablet Take 1 tablet (10 mg total) by mouth daily.   Cranberry-Vitamin C (CRANBERRY CONCENTRATE/VITAMINC) 15000-100 MG CAPS Take by mouth.   cyanocobalamin (,VITAMIN B-12,) 1000 MCG/ML injection Inject 1 mL (1,000 mcg total) into the muscle every 30 (thirty) days. Pt needs syringes as well   fluconazole (DIFLUCAN) 150 MG tablet Take 1 tablet by mouth once and may repeat in 3 days if symptoms persist   fluticasone (FLONASE) 50 MCG/ACT nasal spray Place 2 sprays into both nostrils daily. (Patient taking differently: Place 2 sprays into both nostrils as needed.)   montelukast (SINGULAIR) 10 MG tablet TAKE 1 TABLET BY MOUTH EVERYDAY AT BEDTIME   phentermine (ADIPEX-P) 37.5 MG tablet Take 1 tablet (37.5 mg total) by mouth daily before breakfast. May take 1/2 tablet if whole not tolerated.   rosuvastatin (CRESTOR) 5 MG tablet TAKE 1 TABLET (5 MG TOTAL) BY MOUTH DAILY.   SUMAtriptan (IMITREX) 50 MG tablet Take 1 tablet (50 mg total) by mouth every 2 (two) hours as needed for  migraine. May repeat in 2 hours if migraine persists or recurs.max 200 mg per 24 hours.   [DISCONTINUED] ALPRAZolam (XANAX) 0.25 MG tablet Take 1 tablet (0.25 mg total) by mouth 2 (two) times daily as needed (severe anxiety).   [DISCONTINUED] eszopiclone (LUNESTA) 2 MG TABS tablet Take 1 tablet (2 mg total) by mouth at bedtime as needed for sleep. Take immediately before bedtime   [DISCONTINUED] phentermine 15 MG capsule Take one tab po qd in am   eszopiclone (LUNESTA) 2 MG TABS tablet Take 1 tablet (2 mg total) by mouth at bedtime as needed for sleep. Take immediately before bedtime   No facility-administered encounter medications on file as of 06/25/2022.    Surgical History: Past Surgical History:  Procedure Laterality Date   ABDOMINAL HYSTERECTOMY     ANTERIOR AND POSTERIOR REPAIR  03/16/2012   Procedure: ANTERIOR (CYSTOCELE) AND POSTERIOR REPAIR (RECTOCELE);  Surgeon: [name], MD;  Location: [facility];  Service: Gynecology;  Laterality: N/A;  anterior repair/cysto   BLADDER SUSPENSION  03/16/2012   Procedure: TRANSVAGINAL TAPE (TVT) PROCEDURE;  Surgeon: [name], MD;  Location: [facility];  Service: Gynecology;  Laterality: N/A;   BREAST REDUCTION SURGERY  6/04   CHOLECYSTECTOMY  06/28/14   CYSTOSCOPY  03/16/2012   Procedure: CYSTOSCOPY;  Surgeon: [name], MD;  Location: [facility];  Service: Gynecology;  Laterality: N/A;   CYSTOSCOPY  03/16/2017   Procedure: CYSTOSCOPY;  Surgeon:  Everett Graff, MD;  Location: [facility];  Service: Gynecology;;   [place]  03/16/2012   Procedure: DILATATION & CURETTAGE/HYSTEROSCOPY WITH NOVASURE ABLATION;  Surgeon: [name], MD;  Location: [facility];  Service: Gynecology;  Laterality: N/A;   EXTRACORPOREAL SHOCK WAVE LITHOTRIPSY Right 07/18/2021   Procedure: EXTRACORPOREAL SHOCK WAVE LITHOTRIPSY (ESWL);  Surgeon: Billey Co, MD;  Location: [facility];  Service: Urology;  Laterality:  Right;   TONSILLECTOMY AND ADENOIDECTOMY     VAGINAL HYSTERECTOMY Bilateral 03/16/2017   Procedure: HYSTERECTOMY VAGINAL Bilateral Salpingectomy ;  Surgeon: Everett Graff, MD;  Location: [facility];  Service: Gynecology;  Laterality: Bilateral;    Medical History: Past Medical History:  Diagnosis Date   Anxiety 08/29/10   Bursitis    left foot   BV (bacterial vaginosis) [id number]   Complication of anesthesia 2004   vomited   Gross hematuria 07/08/2015   H/O bladder infections    H/O mumps    H/O toxoplasmosis    H/O varicella    History of kidney stones    Infections of kidney    several kidney stones, bladder infections   Kidney disease 09/03/2011   Left ureteral calculus with partial obstruction, passed 06/09/2014. CT did show perinephric stranding.    Menometrorrhagia 06/26/10   Plantar fasciitis    PMS (premenstrual syndrome) 07/10/11   Post - coital bleeding 08/29/10   Renal calculus, left    Sinus infection    UTI (lower urinary tract infection) 07/08/2015   Yeast infection     Family History: Family History  Problem Relation Age of Onset   Cancer Other        breast   Cancer Mother        cervical   Depression Mother    Drug abuse Mother    Anemia Mother        blood transfusion   Migraines Mother     Social History   Socioeconomic History   Marital status: Married    Spouse name: Not on file   Number of children: Not on file   Years of education: Not on file   Highest education level: Not on file  Occupational History   Not on file  Tobacco Use   Smoking status: Never   Smokeless tobacco: Never  Vaping Use   Vaping Use: Never used  Substance and Sexual Activity   Alcohol use: No   Drug use: No   Sexual activity: Yes    Birth control/protection: Surgical    Comment: pt spouse had vasec.   Other Topics Concern   Not on file  Social History Narrative   Not on file   Social Determinants of Health   Financial Resource Strain: Not on file  Food  Insecurity: Not on file  Transportation Needs: Not on file  Physical Activity: Not on file  Stress: Not on file  Social Connections: Not on file  Intimate Partner Violence: Not on file      Review of Systems  Constitutional:  Negative for chills, fatigue and unexpected weight change.  HENT: Negative.  Negative for congestion, rhinorrhea, sneezing and sore throat.   Eyes:  Negative for redness.  Respiratory: Negative.  Negative for cough, chest tightness, shortness of breath and wheezing.   Cardiovascular: Negative.  Negative for chest pain and palpitations.  Gastrointestinal:  Negative for abdominal pain, constipation, diarrhea, nausea and vomiting.  Genitourinary:  Negative for dysuria and frequency.  Musculoskeletal:  Negative for arthralgias,  back pain, joint swelling and neck pain.  Skin:  Negative for rash.  Neurological: Negative.  Negative for tremors and numbness.  Hematological:  Negative for adenopathy. Does not bruise/bleed easily.  Psychiatric/Behavioral:  Positive for behavioral problems (Depression -- takes bupropion) and sleep disturbance (takes lunesta). Negative for decreased concentration, self-injury and suicidal ideas. The patient is nervous/anxious.     Vital Signs: BP 96/65   Pulse 82   Temp 98.1 F (36.7 C)   Resp 16   Ht 5\' 3"  (1.6 m)   Wt 158 lb 6.4 oz (71.8 kg)   LMP 02/23/2017 (Exact Date)   SpO2 98%   BMI 28.06 kg/m    Physical Exam Vitals reviewed.  Constitutional:      General: She is not in acute distress.    Appearance: Normal appearance. She is not ill-appearing.  HENT:     Head: Normocephalic and atraumatic.  Eyes:     Pupils: Pupils are equal, round, and reactive to light.  Cardiovascular:     Rate and Rhythm: Normal rate and regular rhythm.  Pulmonary:     Effort: Pulmonary effort is normal. No respiratory distress.  Neurological:     Mental Status: She is alert and oriented to person, place, and time.  Psychiatric:         Mood and Affect: Mood normal.        Behavior: Behavior normal.        Assessment/Plan: 1. Abnormal weight gain Continue phentermine but switch to tablet.  - phentermine (ADIPEX-P) 37.5 MG tablet; Take 1 tablet (37.5 mg total) by mouth daily before breakfast. May take 1/2 tablet if whole not tolerated.  Dispense: 30 tablet; Refill: 0  2. Overweight with body mass index (BMI) of 29 to 29.9 in adult Continue phentermine, except switch to tablet, may take 1/2 tablet if not toelrate whole tablet.  - phentermine (ADIPEX-P) 37.5 MG tablet; Take 1 tablet (37.5 mg total) by mouth daily before breakfast. May take 1/2 tablet if whole not tolerated.  Dispense: 30 tablet; Refill: 0  3. Primary insomnia Continue lunesta as prescribed.  - eszopiclone (LUNESTA) 2 MG TABS tablet; Take 1 tablet (2 mg total) by mouth at bedtime as needed for sleep. Take immediately before bedtime  Dispense: 30 tablet; Refill: 2   General Counseling: Shanara verbalizes understanding of the findings of todays visit and agrees with plan of treatment. I have discussed any further diagnostic evaluation that may be needed or ordered today. We also reviewed her medications today. she has been encouraged to call the office with any questions or concerns that should arise related to todays visit.    No orders of the defined types were placed in this encounter.   Meds ordered this encounter  Medications   phentermine (ADIPEX-P) 37.5 MG tablet    Sig: Take 1 tablet (37.5 mg total) by mouth daily before breakfast. May take 1/2 tablet if whole not tolerated.    Dispense:  30 tablet    Refill:  0   eszopiclone (LUNESTA) 2 MG TABS tablet    Sig: Take 1 tablet (2 mg total) by mouth at bedtime as needed for sleep. Take immediately before bedtime    Dispense:  30 tablet    Refill:  2    For future refills    Return in about 8 weeks (around 08/20/2022) for F/U, Weight loss, Shahzad Thomann PCP.   Total time spent:30 Minutes Time spent  includes review of chart, medications, test results, and follow up  plan with the patient.   [place] Controlled Substance Database was reviewed by me.  This patient was seen by Jonetta Osgood, FNP-C in collaboration with Dr. Clayborn Bigness as a part of collaborative care agreement.   Xzavier Swinger R. Valetta Fuller, MSN, FNP-C Internal medicine

## 2022-07-19 ENCOUNTER — Other Ambulatory Visit: Payer: Self-pay | Admitting: Internal Medicine

## 2022-07-19 DIAGNOSIS — E538 Deficiency of other specified B group vitamins: Secondary | ICD-10-CM

## 2022-08-12 ENCOUNTER — Ambulatory Visit: Payer: BC Managed Care – PPO | Admitting: Physician Assistant

## 2022-08-20 ENCOUNTER — Other Ambulatory Visit: Payer: Self-pay | Admitting: Nurse Practitioner

## 2022-08-20 ENCOUNTER — Ambulatory Visit: Payer: BC Managed Care – PPO | Admitting: Nurse Practitioner

## 2022-08-20 DIAGNOSIS — R635 Abnormal weight gain: Secondary | ICD-10-CM

## 2022-08-20 DIAGNOSIS — E663 Overweight: Secondary | ICD-10-CM

## 2022-08-20 MED ORDER — PHENTERMINE HCL 37.5 MG PO TABS: 37.5000 mg | ORAL_TABLET | Freq: Every day | ORAL | 0 refills | Status: DC

## 2022-09-19 ENCOUNTER — Ambulatory Visit (INDEPENDENT_AMBULATORY_CARE_PROVIDER_SITE_OTHER): Payer: BC Managed Care – PPO | Admitting: Nurse Practitioner

## 2022-09-19 ENCOUNTER — Encounter: Payer: Self-pay | Admitting: Nurse Practitioner

## 2022-09-19 VITALS — BP 100/78 | HR 74 | Temp 98.3°F | Resp 16 | Ht 63.0 in | Wt 152.4 lb

## 2022-09-19 DIAGNOSIS — Z6827 Body mass index (BMI) 27.0-27.9, adult: Secondary | ICD-10-CM

## 2022-09-19 DIAGNOSIS — J301 Allergic rhinitis due to pollen: Secondary | ICD-10-CM | POA: Diagnosis not present

## 2022-09-19 DIAGNOSIS — F321 Major depressive disorder, single episode, moderate: Secondary | ICD-10-CM

## 2022-09-19 DIAGNOSIS — E663 Overweight: Secondary | ICD-10-CM

## 2022-09-19 DIAGNOSIS — G4709 Other insomnia: Secondary | ICD-10-CM | POA: Diagnosis not present

## 2022-09-19 DIAGNOSIS — I10 Essential (primary) hypertension: Secondary | ICD-10-CM | POA: Diagnosis not present

## 2022-09-19 DIAGNOSIS — E782 Mixed hyperlipidemia: Secondary | ICD-10-CM

## 2022-09-19 DIAGNOSIS — E538 Deficiency of other specified B group vitamins: Secondary | ICD-10-CM

## 2022-09-19 DIAGNOSIS — E559 Vitamin D deficiency, unspecified: Secondary | ICD-10-CM

## 2022-09-19 MED ORDER — BUPROPION HCL ER (XL) 150 MG PO TB24: 150.0000 mg | ORAL_TABLET | Freq: Every day | ORAL | 2 refills | Status: DC

## 2022-09-19 MED ORDER — ROSUVASTATIN CALCIUM 5 MG PO TABS: 5.0000 mg | ORAL_TABLET | Freq: Every day | ORAL | 1 refills | Status: DC

## 2022-09-19 MED ORDER — PHENTERMINE HCL 37.5 MG PO TABS: 37.5000 mg | ORAL_TABLET | Freq: Every day | ORAL | 2 refills | Status: DC

## 2022-09-19 MED ORDER — BISOPROLOL-HYDROCHLOROTHIAZIDE 2.5-6.25 MG PO TABS: 1.0000 | ORAL_TABLET | Freq: Every day | ORAL | 1 refills | Status: DC

## 2022-09-19 MED ORDER — MONTELUKAST SODIUM 10 MG PO TABS: ORAL_TABLET | ORAL | 1 refills | Status: DC

## 2022-09-19 MED ORDER — ESZOPICLONE 2 MG PO TABS: 2.0000 mg | ORAL_TABLET | Freq: Every evening | ORAL | 2 refills | Status: DC | PRN

## 2022-09-19 NOTE — Progress Notes (Signed)
[facility] [address] [place], [place] [address]  Internal MEDICINE  Office Visit Note  Patient Name: Kayla Jimenez  [id number]  [id number]  Date of Service: 09/19/2022  Chief Complaint  Patient presents with   Follow-up    HPI [name] presents for a follow-up visit for insomnia and weight loss management. Insomnia -- due for routine refills of sleep medication Weight loss management  -- lost 6 more lbs since march.  Hypertension -- well controlled with bisoprolol-HCTZ Due for routine labs in July Allergic rhinitis -- controlled with montelukast, no longer taking cetirizine or flonase  Depression -- cannot remember to take 2nd dose of bupropion every day, need to switch to 24 hr extended release    Current Medication: Outpatient Encounter Medications as of 09/19/2022  Medication Sig   buPROPion (WELLBUTRIN XL) 150 MG 24 hr tablet Take 1 tablet (150 mg total) by mouth daily.   Cranberry-Vitamin C (CRANBERRY CONCENTRATE/VITAMINC) 15000-100 MG CAPS Take by mouth.   [DISCONTINUED] bisoprolol-hydrochlorothiazide (ZIAC) 2.5-6.25 MG tablet TAKE 1 TABLET BY MOUTH EVERY DAY   [DISCONTINUED] buPROPion ER (WELLBUTRIN SR) 100 MG 12 hr tablet Take 1 tablet (100 mg total) by mouth 2 (two) times daily.   [DISCONTINUED] cyanocobalamin (VITAMIN B12) 1000 MCG/ML injection INJECT 1 ML (1,000 MCG TOTAL) INTO THE MUSCLE EVERY 30 (THIRTY) DAYS. PT NEEDS SYRINGES AS WELL   [DISCONTINUED] eszopiclone (LUNESTA) 2 MG TABS tablet Take 1 tablet (2 mg total) by mouth at bedtime as needed for sleep. Take immediately before bedtime   [DISCONTINUED] montelukast (SINGULAIR) 10 MG tablet TAKE 1 TABLET BY MOUTH EVERYDAY AT BEDTIME   [DISCONTINUED] phentermine (ADIPEX-P) 37.5 MG tablet Take 1 tablet (37.5 mg total) by mouth daily before breakfast. May take 1/2 tablet if whole not tolerated.   [DISCONTINUED] rosuvastatin (CRESTOR) 5 MG tablet TAKE 1 TABLET (5 MG TOTAL) BY MOUTH DAILY.    bisoprolol-hydrochlorothiazide (ZIAC) 2.5-6.25 MG tablet Take 1 tablet by mouth daily.   eszopiclone (LUNESTA) 2 MG TABS tablet Take 1 tablet (2 mg total) by mouth at bedtime as needed for sleep. Take immediately before bedtime   montelukast (SINGULAIR) 10 MG tablet TAKE 1 TABLET BY MOUTH EVERYDAY AT BEDTIME   phentermine (ADIPEX-P) 37.5 MG tablet Take 1 tablet (37.5 mg total) by mouth daily before breakfast. May take 1/2 tablet if whole not tolerated.   rosuvastatin (CRESTOR) 5 MG tablet Take 1 tablet (5 mg total) by mouth daily.   [DISCONTINUED] benzonatate (TESSALON) 100 MG capsule Take 1-2 tablets 3 times a day as needed for cough   [DISCONTINUED] cetirizine (ZYRTEC) 10 MG tablet Take 1 tablet (10 mg total) by mouth daily.   [DISCONTINUED] fluconazole (DIFLUCAN) 150 MG tablet Take 1 tablet by mouth once and may repeat in 3 days if symptoms persist   [DISCONTINUED] fluticasone (FLONASE) 50 MCG/ACT nasal spray Place 2 sprays into both nostrils daily. (Patient taking differently: Place 2 sprays into both nostrils as needed.)   [DISCONTINUED] SUMAtriptan (IMITREX) 50 MG tablet Take 1 tablet (50 mg total) by mouth every 2 (two) hours as needed for migraine. May repeat in 2 hours if migraine persists or recurs.max 200 mg per 24 hours.   No facility-administered encounter medications on file as of 09/19/2022.    Surgical History: Past Surgical History:  Procedure Laterality Date   ABDOMINAL HYSTERECTOMY     ANTERIOR AND POSTERIOR REPAIR  03/16/2012   Procedure: ANTERIOR (CYSTOCELE) AND POSTERIOR REPAIR (RECTOCELE);  Surgeon: Purcell Nails, MD;  Location: [facility];  Service: Gynecology;  Laterality: N/A;  anterior repair/cysto   BLADDER SUSPENSION  03/16/2012   Procedure: TRANSVAGINAL TAPE (TVT) PROCEDURE;  Surgeon: Purcell Nails, MD;  Location: [facility];  Service: Gynecology;  Laterality: N/A;   BREAST REDUCTION SURGERY  6/04   CHOLECYSTECTOMY  06/28/14   CYSTOSCOPY  03/16/2012   Procedure:  CYSTOSCOPY;  Surgeon: Purcell Nails, MD;  Location: [facility];  Service: Gynecology;  Laterality: N/A;   CYSTOSCOPY  03/16/2017   Procedure: CYSTOSCOPY;  Surgeon: Osborn Coho, MD;  Location: [facility];  Service: Gynecology;;   DILITATION & CURRETTAGE/HYSTROSCOPY WITH NOVASURE ABLATION  03/16/2012   Procedure: DILATATION & CURETTAGE/HYSTEROSCOPY WITH NOVASURE ABLATION;  Surgeon: Purcell Nails, MD;  Location: [facility];  Service: Gynecology;  Laterality: N/A;   EXTRACORPOREAL SHOCK WAVE LITHOTRIPSY Right 07/18/2021   Procedure: EXTRACORPOREAL SHOCK WAVE LITHOTRIPSY (ESWL);  Surgeon: Sondra Come, MD;  Location: [facility];  Service: Urology;  Laterality: Right;   TONSILLECTOMY AND ADENOIDECTOMY     VAGINAL HYSTERECTOMY Bilateral 03/16/2017   Procedure: HYSTERECTOMY VAGINAL Bilateral Salpingectomy ;  Surgeon: Osborn Coho, MD;  Location: [facility];  Service: Gynecology;  Laterality: Bilateral;    Medical History: Past Medical History:  Diagnosis Date   Anxiety 08/29/10   Bursitis    left foot   BV (bacterial vaginosis) 08/29/10   Complication of anesthesia 2004   vomited   Gross hematuria 07/08/2015   H/O bladder infections    H/O mumps    H/O toxoplasmosis    H/O varicella    History of kidney stones    Infections of kidney    several kidney stones, bladder infections   Kidney disease 09/03/2011   Left ureteral calculus with partial obstruction, passed 06/09/2014. CT did show perinephric stranding.    Menometrorrhagia 06/26/10   Plantar fasciitis    PMS (premenstrual syndrome) 07/10/11   Post - coital bleeding 08/29/10   Renal calculus, left    Sinus infection    UTI (lower urinary tract infection) 07/08/2015   Yeast infection     Family History: Family History  Problem Relation Age of Onset   Cancer Other        breast   Cancer Mother        cervical   Depression Mother    Drug abuse Mother    Anemia Mother        blood transfusion   Migraines Mother     Social History    Socioeconomic History   Marital status: Married    Spouse name: Not on file   Number of children: Not on file   Years of education: Not on file   Highest education level: Not on file  Occupational History   Not on file  Tobacco Use   Smoking status: Never   Smokeless tobacco: Never  Vaping Use   Vaping Use: Never used  Substance and Sexual Activity   Alcohol use: No   Drug use: No   Sexual activity: Yes    Birth control/protection: Surgical    Comment: pt spouse had vasec.   Other Topics Concern   Not on file  Social History Narrative   Not on file   Social Determinants of Health   Financial Resource Strain: Not on file  Food Insecurity: Not on file  Transportation Needs: Not on file  Physical Activity: Not on file  Stress: Not on file  Social Connections: Not on file  Intimate Partner Violence: Not on file  Review of Systems  Constitutional:  Negative for chills, fatigue and unexpected weight change.  HENT: Negative.  Negative for congestion, rhinorrhea, sneezing and sore throat.   Eyes:  Negative for redness.  Respiratory: Negative.  Negative for cough, chest tightness, shortness of breath and wheezing.   Cardiovascular: Negative.  Negative for chest pain and palpitations.  Gastrointestinal:  Negative for abdominal pain, constipation, diarrhea, nausea and vomiting.  Genitourinary:  Negative for dysuria and frequency.  Musculoskeletal:  Negative for arthralgias, back pain, joint swelling and neck pain.  Skin:  Negative for rash.  Neurological: Negative.  Negative for tremors and numbness.  Hematological:  Negative for adenopathy. Does not bruise/bleed easily.  Psychiatric/Behavioral:  Positive for behavioral problems (Depression -- takes bupropion) and sleep disturbance (takes lunesta). Negative for decreased concentration, self-injury and suicidal ideas. The patient is nervous/anxious.     Vital Signs: BP 100/78   Pulse 74   Temp 98.3 F (36.8 C)    Resp 16   Ht 5\' 3"  (1.6 m)   Wt 152 lb 6.4 oz (69.1 kg)   LMP 02/23/2017 (Exact Date)   SpO2 99%   BMI 27.00 kg/m    Physical Exam Vitals reviewed.  Constitutional:      General: She is not in acute distress.    Appearance: Normal appearance. She is not ill-appearing.  HENT:     Head: Normocephalic and atraumatic.  Eyes:     Pupils: Pupils are equal, round, and reactive to light.  Cardiovascular:     Rate and Rhythm: Normal rate and regular rhythm.  Pulmonary:     Effort: Pulmonary effort is normal. No respiratory distress.  Neurological:     Mental Status: She is alert and oriented to person, place, and time.  Psychiatric:        Mood and Affect: Mood normal.        Behavior: Behavior normal.        Assessment/Plan: 1. Benign hypertension Continue bisoprolol-HCTZ as prescribed. Routine labs ordered.  - bisoprolol-hydrochlorothiazide (ZIAC) 2.5-6.25 MG tablet; Take 1 tablet by mouth daily.  Dispense: 90 tablet; Refill: 1 - CBC with Differential/Platelet - CMP14+EGFR - Lipid Profile  2. Other insomnia Continue lunesta as prescribed. Follow up in 3 months for additional refills, routine labs ordered  - eszopiclone (LUNESTA) 2 MG TABS tablet; Take 1 tablet (2 mg total) by mouth at bedtime as needed for sleep. Take immediately before bedtime  Dispense: 30 tablet; Refill: 2 - CBC with Differential/Platelet - CMP14+EGFR - Lipid Profile  3. Mixed hyperlipidemia Continue rosuvastatin as prescribed. Routine labs ordered  - rosuvastatin (CRESTOR) 5 MG tablet; Take 1 tablet (5 mg total) by mouth daily.  Dispense: 90 tablet; Refill: 1 - CBC with Differential/Platelet - CMP14+EGFR - Lipid Profile  4. Allergic rhinitis due to pollen, unspecified seasonality Continue montelukast as prescribed. Routine labs ordered  - montelukast (SINGULAIR) 10 MG tablet; TAKE 1 TABLET BY MOUTH EVERYDAY AT BEDTIME  Dispense: 90 tablet; Refill: 1 - CBC with Differential/Platelet -  CMP14+EGFR - Lipid Profile  5. B12 deficiency Routine labs ordered  - B12 and Folate Panel - Iron, TIBC and Ferritin Panel - Vitamin D (25 hydroxy) - CBC with Differential/Platelet - CMP14+EGFR - Lipid Profile  6. Vitamin D deficiency Routine labs ordered  - Vitamin D (25 hydroxy) - CBC with Differential/Platelet - CMP14+EGFR - Lipid Profile  7. Overweight with body mass index (BMI) of 27 to 27.9 in adult Continue phentermine and bupropion as prescribed. Routine labs ordered  -  buPROPion (WELLBUTRIN XL) 150 MG 24 hr tablet; Take 1 tablet (150 mg total) by mouth daily.  Dispense: 30 tablet; Refill: 2 - phentermine (ADIPEX-P) 37.5 MG tablet; Take 1 tablet (37.5 mg total) by mouth daily before breakfast. May take 1/2 tablet if whole not tolerated.  Dispense: 30 tablet; Refill: 2 - CBC with Differential/Platelet - CMP14+EGFR - Lipid Profile  8. Moderate major depression (HCC) Switch to 24 hr extended release bupropion once daily, follow up in 3 months.  - buPROPion (WELLBUTRIN XL) 150 MG 24 hr tablet; Take 1 tablet (150 mg total) by mouth daily.  Dispense: 30 tablet; Refill: 2   General Counseling: Danyelle verbalizes understanding of the findings of todays visit and agrees with plan of treatment. I have discussed any further diagnostic evaluation that may be needed or ordered today. We also reviewed her medications today. she has been encouraged to call the office with any questions or concerns that should arise related to todays visit.    Orders Placed This Encounter  Procedures   B12 and Folate Panel   Iron, TIBC and Ferritin Panel   Vitamin D (25 hydroxy)   CBC with Differential/Platelet   CMP14+EGFR   Lipid Profile    Meds ordered this encounter  Medications   buPROPion (WELLBUTRIN XL) 150 MG 24 hr tablet    Sig: Take 1 tablet (150 mg total) by mouth daily.    Dispense:  30 tablet    Refill:  2   bisoprolol-hydrochlorothiazide (ZIAC) 2.5-6.25 MG tablet    Sig: Take  1 tablet by mouth daily.    Dispense:  90 tablet    Refill:  1   eszopiclone (LUNESTA) 2 MG TABS tablet    Sig: Take 1 tablet (2 mg total) by mouth at bedtime as needed for sleep. Take immediately before bedtime    Dispense:  30 tablet    Refill:  2    For future refills   phentermine (ADIPEX-P) 37.5 MG tablet    Sig: Take 1 tablet (37.5 mg total) by mouth daily before breakfast. May take 1/2 tablet if whole not tolerated.    Dispense:  30 tablet    Refill:  2   rosuvastatin (CRESTOR) 5 MG tablet    Sig: Take 1 tablet (5 mg total) by mouth daily.    Dispense:  90 tablet    Refill:  1   montelukast (SINGULAIR) 10 MG tablet    Sig: TAKE 1 TABLET BY MOUTH EVERYDAY AT BEDTIME    Dispense:  90 tablet    Refill:  1    Return in about 12 weeks (around 12/12/2022) for F/U, Kamylah Manzo PCP lunesta refills, labs and weight loss. .   Total time spent:30 Minutes Time spent includes review of chart, medications, test results, and follow up plan with the patient.   Agency Controlled Substance Database was reviewed by me.  This patient was seen by Sallyanne Kuster, FNP-C in collaboration with Dr. Beverely Risen as a part of collaborative care agreement.   Anthoney Sheppard R. Tedd Sias, MSN, FNP-C Internal medicine

## 2022-09-20 LAB — LIPID PANEL
Chol/HDL Ratio: 3 ratio (ref 0.0–4.4)
Cholesterol, Total: 116 mg/dL (ref 100–199)
HDL: 39 mg/dL — ABNORMAL LOW (ref >39)
LDL Chol Calc (NIH): 47 mg/dL (ref 0–99)
Triglycerides: 181 mg/dL — ABNORMAL HIGH (ref 0–149)
VLDL Cholesterol Cal: 30 mg/dL (ref 5–40)

## 2022-09-20 LAB — CMP14+EGFR
ALT: 9 IU/L (ref 0–32)
AST: 9 IU/L (ref 0–40)
Albumin/Globulin Ratio: 1.7
Albumin: 4.1 g/dL (ref 3.9–4.9)
Alkaline Phosphatase: 61 IU/L (ref 44–121)
BUN/Creatinine Ratio: 13 (ref 9–23)
BUN: 10 mg/dL (ref 6–24)
Bilirubin Total: 0.5 mg/dL (ref 0.0–1.2)
CO2: 25 mmol/L (ref 20–29)
Calcium: 9.7 mg/dL (ref 8.7–10.2)
Chloride: 101 mmol/L (ref 96–106)
Creatinine, Ser: 0.78 mg/dL (ref 0.57–1.00)
Globulin, Total: 2.4 g/dL (ref 1.5–4.5)
Glucose: 100 mg/dL — ABNORMAL HIGH (ref 70–99)
Potassium: 4.2 mmol/L (ref 3.5–5.2)
Sodium: 139 mmol/L (ref 134–144)
Total Protein: 6.5 g/dL (ref 6.0–8.5)
eGFR: 94 mL/min/{1.73_m2} (ref >59)

## 2022-09-20 LAB — CBC WITH DIFFERENTIAL/PLATELET
Basophils Absolute: 0.1 10*3/uL (ref 0.0–0.2)
Basos: 1 %
EOS (ABSOLUTE): 0.1 10*3/uL (ref 0.0–0.4)
Eos: 1 %
Hematocrit: 39.1 % (ref 34.0–46.6)
Hemoglobin: 13.3 g/dL (ref 11.1–15.9)
Immature Grans (Abs): 0 10*3/uL (ref 0.0–0.1)
Immature Granulocytes: 0 %
Lymphocytes Absolute: 2.6 10*3/uL (ref 0.7–3.1)
Lymphs: 25 %
MCH: 32.5 pg (ref 26.6–33.0)
MCHC: 34 g/dL (ref 31.5–35.7)
MCV: 96 fL (ref 79–97)
Monocytes Absolute: 0.7 10*3/uL (ref 0.1–0.9)
Monocytes: 7 %
Neutrophils Absolute: 7 10*3/uL (ref 1.4–7.0)
Neutrophils: 66 %
Platelets: 325 10*3/uL (ref 150–450)
RBC: 4.09 x10E6/uL (ref 3.77–5.28)
RDW: 11.1 % — ABNORMAL LOW (ref 11.7–15.4)
WBC: 10.5 10*3/uL (ref 3.4–10.8)

## 2022-09-20 LAB — B12 AND FOLATE PANEL
Folate: 10.2 ng/mL (ref >3.0)
Folate: 9.8 ng/mL (ref >3.0)
Vitamin B-12: 212 pg/mL — ABNORMAL LOW (ref 232–1245)
Vitamin B-12: 235 pg/mL (ref 232–1245)

## 2022-09-20 LAB — IRON,TIBC AND FERRITIN PANEL
Ferritin: 157 ng/mL — ABNORMAL HIGH (ref 15–150)
Iron Saturation: 42 % (ref 15–55)
Iron: 94 ug/dL (ref 27–159)
Total Iron Binding Capacity: 226 ug/dL — ABNORMAL LOW (ref 250–450)
UIBC: 132 ug/dL (ref 131–425)

## 2022-09-20 LAB — VITAMIN D 25 HYDROXY (VIT D DEFICIENCY, FRACTURES): Vit D, 25-Hydroxy: 32.6 ng/mL (ref 30.0–100.0)

## 2022-09-22 ENCOUNTER — Ambulatory Visit
Admission: RE | Admit: 2022-09-22 | Discharge: 2022-09-22 | Disposition: A | Discharge status: Home / Self Care | Payer: BC Managed Care – PPO | Source: Ambulatory Visit | Attending: Physician Assistant | Admitting: Physician Assistant

## 2022-09-22 ENCOUNTER — Ambulatory Visit
Admission: RE | Admit: 2022-09-22 | Discharge: 2022-09-22 | Disposition: A | Discharge status: Home / Self Care | Payer: BC Managed Care – PPO | Attending: Physician Assistant | Admitting: Physician Assistant

## 2022-09-22 ENCOUNTER — Other Ambulatory Visit: Payer: Self-pay

## 2022-09-22 ENCOUNTER — Encounter: Payer: Self-pay | Admitting: Physician Assistant

## 2022-09-22 ENCOUNTER — Ambulatory Visit: Payer: BC Managed Care – PPO | Admitting: Physician Assistant

## 2022-09-22 VITALS — BP 110/73 | HR 74 | Ht 63.0 in | Wt 152.0 lb

## 2022-09-22 DIAGNOSIS — N201 Calculus of ureter: Secondary | ICD-10-CM

## 2022-09-22 DIAGNOSIS — Z87442 Personal history of urinary calculi: Secondary | ICD-10-CM

## 2022-09-22 DIAGNOSIS — Z09 Encounter for follow-up examination after completed treatment for conditions other than malignant neoplasm: Secondary | ICD-10-CM | POA: Diagnosis not present

## 2022-09-22 LAB — URINALYSIS, COMPLETE
Bilirubin, UA: NEGATIVE
Glucose, UA: NEGATIVE
Ketones, UA: NEGATIVE
Leukocytes,UA: NEGATIVE
Nitrite, UA: NEGATIVE
Protein,UA: NEGATIVE
RBC, UA: NEGATIVE
Specific Gravity, UA: <1.005 — ABNORMAL LOW (ref 1.005–1.030)
Urobilinogen, Ur: 0.2 mg/dL (ref 0.2–1.0)
pH, UA: 5.5 (ref 5.0–7.5)

## 2022-09-22 LAB — MICROSCOPIC EXAMINATION

## 2022-09-22 NOTE — Progress Notes (Signed)
09/22/2022 11:07 AM   Kayla Jimenez January 23, 1976 [id number]  CC: Chief Complaint  Patient presents with   Nephrolithiasis   HPI: Kayla Jimenez is a 47 y.o. female with PMH recurrent UTI and nephrolithiasis who presents today for annual stone follow-up.   Today she reports no additional stones passed in the last year.  She has had several weeks of intermittent right flank pain radiating to the RLQ and is concerned for another acute stone episode.  She denies fever, chills, nausea, and vomiting.  She has been staying very well-hydrated.  KUB today with no radiopaque urolithiasis and stable right phleboliths.  In-office UA today pan negative; urine microscopy with many bacteria.   PMH: Past Medical History:  Diagnosis Date   Anxiety 08/29/10   Bursitis    left foot   BV (bacterial vaginosis) 08/29/10   Complication of anesthesia 2004   vomited   Gross hematuria 07/08/2015   H/O bladder infections    H/O mumps    H/O toxoplasmosis    H/O varicella    History of kidney stones    Infections of kidney    several kidney stones, bladder infections   Kidney disease 09/03/2011   Left ureteral calculus with partial obstruction, passed 06/09/2014. CT did show perinephric stranding.    Menometrorrhagia 06/26/10   Plantar fasciitis    PMS (premenstrual syndrome) 07/10/11   Post - coital bleeding 08/29/10   Renal calculus, left    Sinus infection    UTI (lower urinary tract infection) 07/08/2015   Yeast infection     Surgical History: Past Surgical History:  Procedure Laterality Date   ABDOMINAL HYSTERECTOMY     ANTERIOR AND POSTERIOR REPAIR  03/16/2012   Procedure: ANTERIOR (CYSTOCELE) AND POSTERIOR REPAIR (RECTOCELE);  Surgeon: Purcell Nails, MD;  Location: [facility];  Service: Gynecology;  Laterality: N/A;  anterior repair/cysto   BLADDER SUSPENSION  03/16/2012   Procedure: TRANSVAGINAL TAPE (TVT) PROCEDURE;  Surgeon: Purcell Nails, MD;  Location: [facility];  Service:  Gynecology;  Laterality: N/A;   BREAST REDUCTION SURGERY  6/04   CHOLECYSTECTOMY  06/28/14   CYSTOSCOPY  03/16/2012   Procedure: CYSTOSCOPY;  Surgeon: Purcell Nails, MD;  Location: [facility];  Service: Gynecology;  Laterality: N/A;   CYSTOSCOPY  03/16/2017   Procedure: CYSTOSCOPY;  Surgeon: Osborn Coho, MD;  Location: [facility];  Service: Gynecology;;   DILITATION & CURRETTAGE/HYSTROSCOPY WITH NOVASURE ABLATION  03/16/2012   Procedure: DILATATION & CURETTAGE/HYSTEROSCOPY WITH NOVASURE ABLATION;  Surgeon: Purcell Nails, MD;  Location: [facility];  Service: Gynecology;  Laterality: N/A;   EXTRACORPOREAL SHOCK WAVE LITHOTRIPSY Right 07/18/2021   Procedure: EXTRACORPOREAL SHOCK WAVE LITHOTRIPSY (ESWL);  Surgeon: Sondra Come, MD;  Location: [facility];  Service: Urology;  Laterality: Right;   TONSILLECTOMY AND ADENOIDECTOMY     VAGINAL HYSTERECTOMY Bilateral 03/16/2017   Procedure: HYSTERECTOMY VAGINAL Bilateral Salpingectomy ;  Surgeon: Osborn Coho, MD;  Location: [facility];  Service: Gynecology;  Laterality: Bilateral;    Home Medications:  Allergies as of 09/22/2022       Reactions   Other    Percocet [oxycodone-acetaminophen] Itching   Sulfa Antibiotics Itching   Sulfonamide Derivatives    REACTION: Eyes Itch        Medication List        Accurate as of September 22, 2022 11:07 AM. If you have any questions, ask your nurse or doctor.          bisoprolol-hydrochlorothiazide 2.5-6.25 MG  tablet Commonly known as: ZIAC Take 1 tablet by mouth daily.   buPROPion 150 MG 24 hr tablet Commonly known as: WELLBUTRIN XL Take 1 tablet (150 mg total) by mouth daily.   Cranberry Concentrate/VitaminC 15000-100 MG Caps Generic drug: Cranberry-Vitamin C Take by mouth.   eszopiclone 2 MG Tabs tablet Commonly known as: LUNESTA Take 1 tablet (2 mg total) by mouth at bedtime as needed for sleep. Take immediately before bedtime   montelukast 10 MG tablet Commonly known as: SINGULAIR TAKE  1 TABLET BY MOUTH EVERYDAY AT BEDTIME   phentermine 37.5 MG tablet Commonly known as: ADIPEX-P Take 1 tablet (37.5 mg total) by mouth daily before breakfast. May take 1/2 tablet if whole not tolerated.   rosuvastatin 5 MG tablet Commonly known as: CRESTOR Take 1 tablet (5 mg total) by mouth daily.        Allergies:  Allergies  Allergen Reactions   Other    Percocet [Oxycodone-Acetaminophen] Itching   Sulfa Antibiotics Itching   Sulfonamide Derivatives     REACTION: Eyes Itch    Family History: Family History  Problem Relation Age of Onset   Cancer Other        breast   Cancer Mother        cervical   Depression Mother    Drug abuse Mother    Anemia Mother        blood transfusion   Migraines Mother     Social History:   reports that she has never smoked. She has never used smokeless tobacco. She reports that she does not drink alcohol and does not use drugs.  Physical Exam: BP 110/73   Pulse 74   Ht 5\' 3"  (1.6 m)   Wt 152 lb (68.9 kg)   LMP 02/23/2017 (Exact Date)   BMI 26.93 kg/m   Constitutional:  Alert and oriented, no acute distress, nontoxic appearing HEENT: [place], AT Cardiovascular: No clubbing, cyanosis, or edema Respiratory: Normal respiratory effort, no increased work of breathing Skin: No rashes, bruises or suspicious lesions Neurologic: Grossly intact, no focal deficits, moving all 4 extremities Psychiatric: Normal mood and affect  Laboratory Data Results for orders placed or performed in visit on 09/22/22  Microscopic Examination   Urine  Result Value Ref Range   WBC, UA 0-5 0 - 5 /hpf   RBC, Urine 0-2 0 - 2 /hpf   Epithelial Cells (non renal) 0-10 0 - 10 /hpf   Bacteria, UA Many (A) None seen/Few  Urinalysis, Complete  Result Value Ref Range   Specific Gravity, UA <1.005 (L) 1.005 - 1.030   pH, UA 5.5 5.0 - 7.5   Color, UA Yellow Yellow   Appearance Ur Hazy (A) Clear   Leukocytes,UA Negative Negative   Protein,UA Negative  Negative/Trace   Glucose, UA Negative Negative   Ketones, UA Negative Negative   RBC, UA Negative Negative   Bilirubin, UA Negative Negative   Urobilinogen, Ur 0.2 0.2 - 1.0 mg/dL   Nitrite, UA Negative Negative   Microscopic Examination See below:    Pertinent Imaging: KUB, 09/22/2022: CLINICAL DATA:  Right-sided abdominal pain   EXAM: ABDOMEN - 1 VIEW   COMPARISON:  08/08/2021   FINDINGS: Scattered large and small bowel gas is noted. No abnormal mass or abnormal calcifications are identified. No bony abnormality is seen. Scattered phleboliths are noted.   IMPRESSION: No acute calculus is seen.     Electronically Signed   By: Alcide Clever M.D.   On: 09/26/2022 00:49  I personally reviewed the images referenced above and note no radiopaque urolithiasis.  Assessment & Plan:   1. History of nephrolithiasis No additional stones passed in the last year, UA is bland, and KUB with no significant interval radiopaque stone growth, however with several weeks of intermittent right flank pain radiating to the RLQ, we discussed pursuing additional imaging with renal ultrasound versus CT stone study.  In shared decision-making, we elected to pursue CT stone study for definitive evaluation.  We discussed that if this is negative, her pain is likely musculoskeletal in origin. - Urinalysis, Complete - CULTURE, URINE COMPREHENSIVE - CT RENAL STONE STUDY; Future   Return for Will call with results.  Carman Ching, PA-C  [facility] [place] [address], [address] [place], [place] [address] [phone]

## 2022-09-26 ENCOUNTER — Ambulatory Visit
Admission: RE | Admit: 2022-09-26 | Discharge: 2022-09-26 | Disposition: A | Discharge status: Home / Self Care | Payer: BC Managed Care – PPO | Source: Ambulatory Visit | Attending: Physician Assistant | Admitting: Physician Assistant

## 2022-09-26 DIAGNOSIS — Z87442 Personal history of urinary calculi: Secondary | ICD-10-CM | POA: Insufficient documentation

## 2022-09-26 LAB — CULTURE, URINE COMPREHENSIVE

## 2022-10-01 ENCOUNTER — Other Ambulatory Visit: Payer: Self-pay

## 2022-10-01 ENCOUNTER — Telehealth: Payer: Self-pay

## 2022-10-01 MED ORDER — CYANOCOBALAMIN 1000 MCG/ML IJ SOLN: 1000.0000 ug | INTRAMUSCULAR | 2 refills | Status: DC

## 2022-10-01 NOTE — Telephone Encounter (Signed)
-----   Message from Sallyanne Kuster, NP sent at 10/01/2022  8:35 AM EDT ----- B12 is low at 212 -- would benefit from B12 injections weekly for 3 weeks then monthly for 5 months. Then we can recheck the level.  Iron panel is ok except slightly elevated ferritin. This may improve if we correct the low B12 CBC is ok Metabolic panel is ok Cholesterol overall still improved but triglycerides are still elevated, HDL has improved and is almost in normal range.  Vitamin D is ok but can continue OTC supplement.

## 2022-10-01 NOTE — Progress Notes (Signed)
B12 is low at 212 -- would benefit from B12 injections weekly for 3 weeks then monthly for 5 months. Then we can recheck the level.  Iron panel is ok except slightly elevated ferritin. This may improve if we correct the low B12 CBC is ok Metabolic panel is ok Cholesterol overall still improved but triglycerides are still elevated, HDL has improved and is almost in normal range.  Vitamin D is ok but can continue OTC supplement.

## 2022-10-01 NOTE — Telephone Encounter (Signed)
Pt notified for labs pt had B 12 at home she do at home and she had 6 B12 injection at home and I sent pres for 3 more

## 2022-10-13 ENCOUNTER — Other Ambulatory Visit: Payer: Self-pay | Admitting: Nurse Practitioner

## 2022-10-13 DIAGNOSIS — F321 Major depressive disorder, single episode, moderate: Secondary | ICD-10-CM

## 2022-10-13 DIAGNOSIS — Z6827 Body mass index (BMI) 27.0-27.9, adult: Secondary | ICD-10-CM

## 2022-12-04 ENCOUNTER — Telehealth (INDEPENDENT_AMBULATORY_CARE_PROVIDER_SITE_OTHER): Payer: BC Managed Care – PPO | Admitting: Nurse Practitioner

## 2022-12-04 ENCOUNTER — Encounter: Payer: Self-pay | Admitting: Nurse Practitioner

## 2022-12-04 VITALS — Temp 98.9°F | Ht 63.0 in | Wt 152.0 lb

## 2022-12-04 DIAGNOSIS — G4709 Other insomnia: Secondary | ICD-10-CM | POA: Diagnosis not present

## 2022-12-04 DIAGNOSIS — J0141 Acute recurrent pansinusitis: Secondary | ICD-10-CM

## 2022-12-04 MED ORDER — AMOXICILLIN-POT CLAVULANATE 875-125 MG PO TABS: 1.0000 | ORAL_TABLET | Freq: Two times a day (BID) | ORAL | 0 refills | Status: DC

## 2022-12-04 MED ORDER — ESZOPICLONE 2 MG PO TABS: 2.0000 mg | ORAL_TABLET | Freq: Every evening | ORAL | 0 refills | Status: DC | PRN

## 2022-12-04 NOTE — Progress Notes (Signed)
[facility] [address] [place], [place] [address]  Internal MEDICINE  Telephone Visit  Patient Name: Kayla Jimenez  [id number]  [id number]  Date of Service: 12/04/2022  I connected with the patient at 1500 by telephone and verified the patients identity using two identifiers.   I discussed the limitations, risks, security and privacy concerns of performing an evaluation and management service by telephone and the availability of in person appointments. I also discussed with the patient that there may be a patient responsible charge related to the service.  The patient expressed understanding and agrees to proceed.    Chief Complaint  Patient presents with   Sinusitis   Telephone Assessment   Telephone Screen   Ear Pain    right    HPI Kayla Jimenez presents for a telehealth virtual visit for symptoms of sinusitis. Onset of symptoms was last week Negative for covid Reports nasal congestion, sinus pressure, headache, fatigue, scratchy throat, sinus drainage and right ear pain.      Current Medication: Outpatient Encounter Medications as of 12/04/2022  Medication Sig   amoxicillin-clavulanate (AUGMENTIN) 875-125 MG tablet Take 1 tablet by mouth 2 (two) times daily for 10 days. Take with food   bisoprolol-hydrochlorothiazide (ZIAC) 2.5-6.25 MG tablet Take 1 tablet by mouth daily.   buPROPion (WELLBUTRIN XL) 150 MG 24 hr tablet TAKE 1 TABLET BY MOUTH EVERY DAY   Cranberry-Vitamin C (CRANBERRY CONCENTRATE/VITAMINC) 15000-100 MG CAPS Take by mouth.   cyanocobalamin (VITAMIN B12) 1000 MCG/ML injection Inject 1 mL (1,000 mcg total) into the muscle every 30 (thirty) days.   montelukast (SINGULAIR) 10 MG tablet TAKE 1 TABLET BY MOUTH EVERYDAY AT BEDTIME   phentermine (ADIPEX-P) 37.5 MG tablet Take 1 tablet (37.5 mg total) by mouth daily before breakfast. May take 1/2 tablet if whole not tolerated.   rosuvastatin (CRESTOR) 5 MG tablet Take 1 tablet (5 mg total) by mouth daily.    [DISCONTINUED] eszopiclone (LUNESTA) 2 MG TABS tablet Take 1 tablet (2 mg total) by mouth at bedtime as needed for sleep. Take immediately before bedtime   eszopiclone (LUNESTA) 2 MG TABS tablet Take 1 tablet (2 mg total) by mouth at bedtime as needed for sleep. Take immediately before bedtime   No facility-administered encounter medications on file as of 12/04/2022.    Surgical History: Past Surgical History:  Procedure Laterality Date   ABDOMINAL HYSTERECTOMY     ANTERIOR AND POSTERIOR REPAIR  03/16/2012   Procedure: ANTERIOR (CYSTOCELE) AND POSTERIOR REPAIR (RECTOCELE);  Surgeon: Purcell Nails, MD;  Location: [facility];  Service: Gynecology;  Laterality: N/A;  anterior repair/cysto   BLADDER SUSPENSION  03/16/2012   Procedure: TRANSVAGINAL TAPE (TVT) PROCEDURE;  Surgeon: Purcell Nails, MD;  Location: [facility];  Service: Gynecology;  Laterality: N/A;   BREAST REDUCTION SURGERY  6/04   CHOLECYSTECTOMY  06/28/14   CYSTOSCOPY  03/16/2012   Procedure: CYSTOSCOPY;  Surgeon: Purcell Nails, MD;  Location: [facility];  Service: Gynecology;  Laterality: N/A;   CYSTOSCOPY  03/16/2017   Procedure: CYSTOSCOPY;  Surgeon: Osborn Coho, MD;  Location: [facility];  Service: Gynecology;;   DILITATION & CURRETTAGE/HYSTROSCOPY WITH NOVASURE ABLATION  03/16/2012   Procedure: DILATATION & CURETTAGE/HYSTEROSCOPY WITH NOVASURE ABLATION;  Surgeon: Purcell Nails, MD;  Location: [facility];  Service: Gynecology;  Laterality: N/A;   EXTRACORPOREAL SHOCK WAVE LITHOTRIPSY Right 07/18/2021   Procedure: EXTRACORPOREAL SHOCK WAVE LITHOTRIPSY (ESWL);  Surgeon: Sondra Come, MD;  Location: [facility];  Service: Urology;  Laterality:  Right;   TONSILLECTOMY AND ADENOIDECTOMY     VAGINAL HYSTERECTOMY Bilateral 03/16/2017   Procedure: HYSTERECTOMY VAGINAL Bilateral Salpingectomy ;  Surgeon: Osborn Coho, MD;  Location: [facility];  Service: Gynecology;  Laterality: Bilateral;    Medical History: Past Medical History:   Diagnosis Date   Anxiety 08/29/10   Bursitis    left foot   BV (bacterial vaginosis) 08/29/10   Complication of anesthesia 2004   vomited   Gross hematuria 07/08/2015   H/O bladder infections    H/O mumps    H/O toxoplasmosis    H/O varicella    History of kidney stones    Infections of kidney    several kidney stones, bladder infections   Kidney disease 09/03/2011   Left ureteral calculus with partial obstruction, passed 06/09/2014. CT did show perinephric stranding.    Menometrorrhagia 06/26/10   Plantar fasciitis    PMS (premenstrual syndrome) 07/10/11   Post - coital bleeding 08/29/10   Renal calculus, left    Sinus infection    UTI (lower urinary tract infection) 07/08/2015   Yeast infection     Family History: Family History  Problem Relation Age of Onset   Cancer Other        breast   Cancer Mother        cervical   Depression Mother    Drug abuse Mother    Anemia Mother        blood transfusion   Migraines Mother     Social History   Socioeconomic History   Marital status: Married    Spouse name: Not on file   Number of children: Not on file   Years of education: Not on file   Highest education level: Not on file  Occupational History   Not on file  Tobacco Use   Smoking status: Never   Smokeless tobacco: Never  Vaping Use   Vaping status: Never Used  Substance and Sexual Activity   Alcohol use: No   Drug use: No   Sexual activity: Yes    Birth control/protection: Surgical    Comment: pt spouse had vasec.   Other Topics Concern   Not on file  Social History Narrative   Not on file   Social Determinants of Health   Financial Resource Strain: Not on file  Food Insecurity: Not on file  Transportation Needs: Not on file  Physical Activity: Not on file  Stress: Not on file  Social Connections: Not on file  Intimate Partner Violence: Not on file      Review of Systems  Constitutional:  Positive for fatigue. Negative for chills and fever.   HENT:  Positive for congestion, ear pain, postnasal drip, rhinorrhea, sinus pressure, sinus pain and sore throat.   Respiratory:  Negative for cough, chest tightness, shortness of breath and wheezing.   Cardiovascular: Negative.  Negative for chest pain and palpitations.  Neurological:  Positive for headaches.    Vital Signs: Temp 98.9 F (37.2 C)   Ht 5\' 3"  (1.6 m)   Wt 152 lb (68.9 kg)   LMP 02/23/2017 (Exact Date)   BMI 26.93 kg/m    Observation/Objective: She is alert and oriented. No acute distress noted.     Assessment/Plan: 1. Acute recurrent pansinusitis Augmentin prescribed, take until gone.  - amoxicillin-clavulanate (AUGMENTIN) 875-125 MG tablet; Take 1 tablet by mouth 2 (two) times daily for 10 days. Take with food  Dispense: 20 tablet; Refill: 0  2. Other insomnia Lunesta refill ordered.  -  eszopiclone (LUNESTA) 2 MG TABS tablet; Take 1 tablet (2 mg total) by mouth at bedtime as needed for sleep. Take immediately before bedtime  Dispense: 30 tablet; Refill: 0   General Counseling: Anjana verbalizes understanding of the findings of today's phone visit and agrees with plan of treatment. I have discussed any further diagnostic evaluation that may be needed or ordered today. We also reviewed her medications today. she has been encouraged to call the office with any questions or concerns that should arise related to todays visit.  Return if symptoms worsen or fail to improve.   No orders of the defined types were placed in this encounter.   Meds ordered this encounter  Medications   amoxicillin-clavulanate (AUGMENTIN) 875-125 MG tablet    Sig: Take 1 tablet by mouth 2 (two) times daily for 10 days. Take with food    Dispense:  20 tablet    Refill:  0   eszopiclone (LUNESTA) 2 MG TABS tablet    Sig: Take 1 tablet (2 mg total) by mouth at bedtime as needed for sleep. Take immediately before bedtime    Dispense:  30 tablet    Refill:  0    For future refills     Time spent:10 Minutes Time spent with patient included reviewing progress notes, labs, imaging studies, and discussing plan for follow up.  [place] Controlled Substance Database was reviewed by me for overdose risk score (ORS) if appropriate.  This patient was seen by Sallyanne Kuster, FNP-C in collaboration with Dr. Beverely Risen as a part of collaborative care agreement.  Akyra Bouchie R. Tedd Sias, MSN, FNP-C Internal medicine

## 2022-12-11 ENCOUNTER — Encounter: Payer: Self-pay | Admitting: Nurse Practitioner

## 2022-12-11 ENCOUNTER — Ambulatory Visit (INDEPENDENT_AMBULATORY_CARE_PROVIDER_SITE_OTHER): Payer: BC Managed Care – PPO | Admitting: Nurse Practitioner

## 2022-12-11 VITALS — BP 126/70 | HR 82 | Temp 98.2°F | Resp 16 | Ht 63.0 in | Wt 147.8 lb

## 2022-12-11 DIAGNOSIS — E663 Overweight: Secondary | ICD-10-CM | POA: Diagnosis not present

## 2022-12-11 DIAGNOSIS — E538 Deficiency of other specified B group vitamins: Secondary | ICD-10-CM

## 2022-12-11 DIAGNOSIS — Z6826 Body mass index (BMI) 26.0-26.9, adult: Secondary | ICD-10-CM

## 2022-12-11 DIAGNOSIS — E782 Mixed hyperlipidemia: Secondary | ICD-10-CM | POA: Diagnosis not present

## 2022-12-11 DIAGNOSIS — G4709 Other insomnia: Secondary | ICD-10-CM

## 2022-12-11 MED ORDER — ROSUVASTATIN CALCIUM 5 MG PO TABS: 5.0000 mg | ORAL_TABLET | Freq: Every day | ORAL | 1 refills | Status: DC

## 2022-12-11 MED ORDER — ESZOPICLONE 2 MG PO TABS: 2.0000 mg | ORAL_TABLET | Freq: Every evening | ORAL | 2 refills | Status: DC | PRN

## 2022-12-11 MED ORDER — PHENTERMINE HCL 37.5 MG PO TABS: 37.5000 mg | ORAL_TABLET | Freq: Every day | ORAL | 2 refills | Status: DC

## 2022-12-11 NOTE — Progress Notes (Signed)
[facility] [address] [place], [place] [address]  Internal MEDICINE  Office Visit Note  Patient Name: Kayla Jimenez  [id number]  [id number]  Date of Service: 12/11/2022  Chief Complaint  Patient presents with   Follow-up    Review labs    HPI Kayla Jimenez presents for a follow-up visit for high cholesterol, low B12, insomnia, and weight loss.  Weight loss -- taking phentermine, lost 5 lbs since her office visit in June.  Low B12 -- has been getting B12 injection.   High cholesterol -- mostly improving except triglycerides increased to 181.  Insomnia -- Alfonso Patten continues to be effective.      Current Medication: Outpatient Encounter Medications as of 12/11/2022  Medication Sig   bisoprolol-hydrochlorothiazide (ZIAC) 2.5-6.25 MG tablet Take 1 tablet by mouth daily.   Cranberry-Vitamin C (CRANBERRY CONCENTRATE/VITAMINC) 15000-100 MG CAPS Take by mouth.   montelukast (SINGULAIR) 10 MG tablet TAKE 1 TABLET BY MOUTH EVERYDAY AT BEDTIME   [DISCONTINUED] amoxicillin-clavulanate (AUGMENTIN) 875-125 MG tablet Take 1 tablet by mouth 2 (two) times daily for 10 days. Take with food   [DISCONTINUED] buPROPion (WELLBUTRIN XL) 150 MG 24 hr tablet TAKE 1 TABLET BY MOUTH EVERY DAY   [DISCONTINUED] cyanocobalamin (VITAMIN B12) 1000 MCG/ML injection Inject 1 mL (1,000 mcg total) into the muscle every 30 (thirty) days.   [DISCONTINUED] eszopiclone (LUNESTA) 2 MG TABS tablet Take 1 tablet (2 mg total) by mouth at bedtime as needed for sleep. Take immediately before bedtime   [DISCONTINUED] phentermine (ADIPEX-P) 37.5 MG tablet Take 1 tablet (37.5 mg total) by mouth daily before breakfast. May take 1/2 tablet if whole not tolerated.   [DISCONTINUED] rosuvastatin (CRESTOR) 5 MG tablet Take 1 tablet (5 mg total) by mouth daily.   eszopiclone (LUNESTA) 2 MG TABS tablet Take 1 tablet (2 mg total) by mouth at bedtime as needed for sleep. Take immediately before bedtime   phentermine (ADIPEX-P)  37.5 MG tablet Take 1 tablet (37.5 mg total) by mouth daily before breakfast. May take 1/2 tablet if whole not tolerated.   rosuvastatin (CRESTOR) 5 MG tablet Take 1 tablet (5 mg total) by mouth daily.   No facility-administered encounter medications on file as of 12/11/2022.    Surgical History: Past Surgical History:  Procedure Laterality Date   ABDOMINAL HYSTERECTOMY     ANTERIOR AND POSTERIOR REPAIR  03/16/2012   Procedure: ANTERIOR (CYSTOCELE) AND POSTERIOR REPAIR (RECTOCELE);  Surgeon: Purcell Nails, MD;  Location: [facility];  Service: Gynecology;  Laterality: N/A;  anterior repair/cysto   BLADDER SUSPENSION  03/16/2012   Procedure: TRANSVAGINAL TAPE (TVT) PROCEDURE;  Surgeon: Purcell Nails, MD;  Location: [facility];  Service: Gynecology;  Laterality: N/A;   BREAST REDUCTION SURGERY  6/04   CHOLECYSTECTOMY  06/28/14   CYSTOSCOPY  03/16/2012   Procedure: CYSTOSCOPY;  Surgeon: Purcell Nails, MD;  Location: [facility];  Service: Gynecology;  Laterality: N/A;   CYSTOSCOPY  03/16/2017   Procedure: CYSTOSCOPY;  Surgeon: Osborn Coho, MD;  Location: [facility];  Service: Gynecology;;   DILITATION & CURRETTAGE/HYSTROSCOPY WITH NOVASURE ABLATION  03/16/2012   Procedure: DILATATION & CURETTAGE/HYSTEROSCOPY WITH NOVASURE ABLATION;  Surgeon: Purcell Nails, MD;  Location: [facility];  Service: Gynecology;  Laterality: N/A;   EXTRACORPOREAL SHOCK WAVE LITHOTRIPSY Right 07/18/2021   Procedure: EXTRACORPOREAL SHOCK WAVE LITHOTRIPSY (ESWL);  Surgeon: Sondra Come, MD;  Location: [facility];  Service: Urology;  Laterality: Right;   TONSILLECTOMY AND ADENOIDECTOMY     VAGINAL HYSTERECTOMY Bilateral  03/16/2017   Procedure: HYSTERECTOMY VAGINAL Bilateral Salpingectomy ;  Surgeon: Osborn Coho, MD;  Location: [facility];  Service: Gynecology;  Laterality: Bilateral;    Medical History: Past Medical History:  Diagnosis Date   Anxiety 08/29/10   Bursitis    left foot   BV (bacterial vaginosis) 08/29/10    Complication of anesthesia 2004   vomited   Gross hematuria 07/08/2015   H/O bladder infections    H/O mumps    H/O toxoplasmosis    H/O varicella    History of kidney stones    Infections of kidney    several kidney stones, bladder infections   Kidney disease 09/03/2011   Left ureteral calculus with partial obstruction, passed 06/09/2014. CT did show perinephric stranding.    Menometrorrhagia 06/26/10   Plantar fasciitis    PMS (premenstrual syndrome) 07/10/11   Post - coital bleeding 08/29/10   Renal calculus, left    Sinus infection    UTI (lower urinary tract infection) 07/08/2015   Yeast infection     Family History: Family History  Problem Relation Age of Onset   Cancer Other        breast   Cancer Mother        cervical   Depression Mother    Drug abuse Mother    Anemia Mother        blood transfusion   Migraines Mother     Social History   Socioeconomic History   Marital status: Married    Spouse name: Not on file   Number of children: Not on file   Years of education: Not on file   Highest education level: Not on file  Occupational History   Not on file  Tobacco Use   Smoking status: Never   Smokeless tobacco: Never  Vaping Use   Vaping status: Never Used  Substance and Sexual Activity   Alcohol use: No   Drug use: No   Sexual activity: Yes    Birth control/protection: Surgical    Comment: pt spouse had vasec.   Other Topics Concern   Not on file  Social History Narrative   Not on file   Social Determinants of Health   Financial Resource Strain: Not on file  Food Insecurity: Not on file  Transportation Needs: Not on file  Physical Activity: Not on file  Stress: Not on file  Social Connections: Not on file  Intimate Partner Violence: Not on file      Review of Systems  Constitutional:  Positive for appetite change and unexpected weight change. Negative for chills and fatigue.  HENT: Negative.  Negative for congestion, rhinorrhea, sneezing  and sore throat.   Eyes:  Negative for redness.  Respiratory: Negative.  Negative for cough, chest tightness, shortness of breath and wheezing.   Cardiovascular: Negative.  Negative for chest pain and palpitations.  Gastrointestinal:  Negative for abdominal pain, constipation, diarrhea, nausea and vomiting.  Genitourinary:  Negative for dysuria and frequency.  Musculoskeletal:  Negative for arthralgias, back pain, joint swelling and neck pain.  Skin:  Negative for rash.  Neurological: Negative.  Negative for tremors and numbness.  Hematological:  Negative for adenopathy. Does not bruise/bleed easily.  Psychiatric/Behavioral:  Positive for behavioral problems (Depression -- takes bupropion) and sleep disturbance (takes lunesta). Negative for decreased concentration, self-injury and suicidal ideas. The patient is nervous/anxious.     Vital Signs: BP 126/70   Pulse 82   Temp 98.2 F (36.8 C)   Resp 16  Ht 5\' 3"  (1.6 m)   Wt 147 lb 12.8 oz (67 kg)   LMP 02/23/2017 (Exact Date)   SpO2 96%   BMI 26.18 kg/m    Physical Exam Vitals reviewed.  Constitutional:      General: She is not in acute distress.    Appearance: Normal appearance. She is not ill-appearing.  HENT:     Head: Normocephalic and atraumatic.  Eyes:     Pupils: Pupils are equal, round, and reactive to light.  Cardiovascular:     Rate and Rhythm: Normal rate and regular rhythm.  Pulmonary:     Effort: Pulmonary effort is normal. No respiratory distress.  Neurological:     Mental Status: She is alert and oriented to person, place, and time.  Psychiatric:        Mood and Affect: Mood normal.        Behavior: Behavior normal.        Assessment/Plan: 1. B12 deficiency Continue B12 injections as scheduled   2. Other insomnia Continue lunesta as prescribed. Follow up in 3 months for additional refills  - eszopiclone (LUNESTA) 2 MG TABS tablet; Take 1 tablet (2 mg total) by mouth at bedtime as needed for sleep.  Take immediately before bedtime  Dispense: 30 tablet; Refill: 2  3. Mixed hyperlipidemia Continue rosuvastatin as prescribed.  - rosuvastatin (CRESTOR) 5 MG tablet; Take 1 tablet (5 mg total) by mouth daily.  Dispense: 90 tablet; Refill: 1  4. Overweight with body mass index (BMI) of 26 to 26.9 in adult Continue phentermine as prescribed, follow up in 3 months to reevaluate weight loss.  - phentermine (ADIPEX-P) 37.5 MG tablet; Take 1 tablet (37.5 mg total) by mouth daily before breakfast. May take 1/2 tablet if whole not tolerated.  Dispense: 30 tablet; Refill: 2   General Counseling: Kayla Jimenez verbalizes understanding of the findings of todays visit and agrees with plan of treatment. I have discussed any further diagnostic evaluation that may be needed or ordered today. We also reviewed her medications today. she has been encouraged to call the office with any questions or concerns that should arise related to todays visit.    No orders of the defined types were placed in this encounter.   Meds ordered this encounter  Medications   eszopiclone (LUNESTA) 2 MG TABS tablet    Sig: Take 1 tablet (2 mg total) by mouth at bedtime as needed for sleep. Take immediately before bedtime    Dispense:  30 tablet    Refill:  2    For future refills   phentermine (ADIPEX-P) 37.5 MG tablet    Sig: Take 1 tablet (37.5 mg total) by mouth daily before breakfast. May take 1/2 tablet if whole not tolerated.    Dispense:  30 tablet    Refill:  2   rosuvastatin (CRESTOR) 5 MG tablet    Sig: Take 1 tablet (5 mg total) by mouth daily.    Dispense:  90 tablet    Refill:  1    Return in about 3 months (around 03/04/2023) for F/U, med refill, Kayla Jimenez PCP, Weight loss lunesta refills .   Total time spent:30 Minutes Time spent includes review of chart, medications, test results, and follow up plan with the patient.   [place] Controlled Substance Database was reviewed by me.  This patient was seen by Sallyanne Kuster, FNP-C in collaboration with Dr. Beverely Risen as a part of collaborative care agreement.   Sanaiya Welliver R. Tedd Sias, MSN, FNP-C Internal  medicine

## 2023-01-04 ENCOUNTER — Encounter: Payer: Self-pay | Admitting: Nurse Practitioner

## 2023-01-05 ENCOUNTER — Telehealth: Payer: Self-pay

## 2023-01-05 NOTE — Telephone Encounter (Signed)
Completed P.A. for patient's Eszopiclone.

## 2023-01-14 ENCOUNTER — Other Ambulatory Visit: Payer: Self-pay | Admitting: Nurse Practitioner

## 2023-01-14 DIAGNOSIS — F321 Major depressive disorder, single episode, moderate: Secondary | ICD-10-CM

## 2023-01-14 DIAGNOSIS — Z6827 Body mass index (BMI) 27.0-27.9, adult: Secondary | ICD-10-CM

## 2023-01-17 ENCOUNTER — Other Ambulatory Visit: Payer: Self-pay | Admitting: Nurse Practitioner

## 2023-01-24 ENCOUNTER — Encounter: Payer: Self-pay | Admitting: Nurse Practitioner

## 2023-01-24 DIAGNOSIS — E782 Mixed hyperlipidemia: Secondary | ICD-10-CM | POA: Insufficient documentation

## 2023-01-24 DIAGNOSIS — E538 Deficiency of other specified B group vitamins: Secondary | ICD-10-CM | POA: Insufficient documentation

## 2023-01-29 ENCOUNTER — Ambulatory Visit
Admission: EM | Admit: 2023-01-29 | Discharge: 2023-01-29 | Disposition: A | Discharge status: Home / Self Care | Payer: BC Managed Care – PPO | Attending: Emergency Medicine | Admitting: Emergency Medicine

## 2023-01-29 DIAGNOSIS — J0100 Acute maxillary sinusitis, unspecified: Secondary | ICD-10-CM

## 2023-01-29 DIAGNOSIS — H6692 Otitis media, unspecified, left ear: Secondary | ICD-10-CM | POA: Diagnosis not present

## 2023-01-29 DIAGNOSIS — J029 Acute pharyngitis, unspecified: Secondary | ICD-10-CM

## 2023-01-29 LAB — POCT RAPID STREP A (OFFICE): Rapid Strep A Screen: NEGATIVE

## 2023-01-29 MED ORDER — CEFDINIR 300 MG PO CAPS: 300.0000 mg | ORAL_CAPSULE | Freq: Two times a day (BID) | ORAL | 0 refills | Status: AC

## 2023-01-29 NOTE — ED Triage Notes (Signed)
Sore throat, cough that started today. Taking ibuprofen.    Pt states she has been having drainage down her throat, and sneezing that started a week ago and thought it was allergies and has been taking OTC allergy medication and does feel better.

## 2023-01-29 NOTE — Discharge Instructions (Addendum)
Take the cefdinir as directed.  Follow up with your primary care provider.    

## 2023-01-29 NOTE — ED Provider Notes (Signed)
Renaldo Fiddler    CSN: [id number] Arrival date & time: 01/29/23  1536      History   Chief Complaint Chief Complaint  Patient presents with   Sore Throat    HPI Kayla Jimenez is a 47 y.o. female.  Patient presents with 1 week history of congestion, postnasal drip, runny nose.  She reports ear pain, sore throat, and cough today.  She has been treating her symptoms with allergy and cold medications without relief.  No fever, shortness of breath, or other symptoms.  She reports history of recurrent sinusitis and allergic rhinitis.  She teaches second grade elementary school.  The history is provided by the patient and medical records.    Past Medical History:  Diagnosis Date   Anxiety 08/29/10   Bursitis    left foot   BV (bacterial vaginosis) 08/29/10   Complication of anesthesia 2004   vomited   Gross hematuria 07/08/2015   H/O bladder infections    H/O mumps    H/O toxoplasmosis    H/O varicella    History of kidney stones    Infections of kidney    several kidney stones, bladder infections   Kidney disease 09/03/2011   Left ureteral calculus with partial obstruction, passed 06/09/2014. CT did show perinephric stranding.    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   Loss of hair 10/07/2018   Body mass index (BMI) of 25.0 to 29.9 10/07/2018   Hematuria 06/28/2018   Right flank pain 06/28/2018   H/O: hysterectomy  11/19/2017   Dysuria 10/20/2017   Allergic rhinitis due to pollen 08/12/2017   Attention and concentration deficit 08/12/2017   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   Other insomnia 08/12/2017   Irregular bleeding 03/16/2017   Plantar fasciitis 03/10/2017   SUI (stress urinary incontinence, female) 08/14/2015   Cystocele, midline 08/14/2015   Renal calculi 07/26/2015   Urinary tract infection with hematuria 07/08/2015   Gross hematuria 07/08/2015   History of renal stone 07/08/2015   Abdominal bloating 07/10/2014   Gallstones 06/13/2014   Yeast infection 09/03/2011   H/O varicella 09/03/2011   H/O mumps 09/03/2011   Postcoital bleeding 09/03/2011   Anxiety 09/03/2011   Premenstrual tension syndrome 09/03/2011   H/O bladder infections 09/03/2011   Kidney disease 09/03/2011   Menorrhagia 07/10/2011    Past Surgical History:  Procedure Laterality Date   ABDOMINAL HYSTERECTOMY     ANTERIOR AND POSTERIOR REPAIR  03/16/2012   Procedure: ANTERIOR (CYSTOCELE) AND POSTERIOR REPAIR (RECTOCELE);  Surgeon: Purcell Nails, MD;  Location: [facility];  Service: Gynecology;  Laterality: N/A;  anterior repair/cysto   BLADDER SUSPENSION  03/16/2012   Procedure: TRANSVAGINAL TAPE (TVT) PROCEDURE;  Surgeon: Purcell Nails, MD;  Location: [facility];  Service: Gynecology;  Laterality: N/A;   BREAST REDUCTION SURGERY  6/04   CHOLECYSTECTOMY  06/28/14   CYSTOSCOPY  03/16/2012   Procedure: CYSTOSCOPY;  Surgeon: Purcell Nails, MD;  Location: [facility];  Service: Gynecology;  Laterality: N/A;   CYSTOSCOPY  03/16/2017   Procedure: CYSTOSCOPY;  Surgeon: Osborn Coho, MD;  Location: [facility];  Service: Gynecology;;   DILITATION & CURRETTAGE/HYSTROSCOPY WITH NOVASURE ABLATION  03/16/2012   Procedure: DILATATION & CURETTAGE/HYSTEROSCOPY WITH NOVASURE ABLATION;  Surgeon: Purcell Nails, MD;  Location: [facility];  Service: Gynecology;  Laterality: N/A;   EXTRACORPOREAL SHOCK WAVE LITHOTRIPSY Right  07/18/2021   Procedure: EXTRACORPOREAL SHOCK WAVE LITHOTRIPSY (ESWL);  Surgeon: Sondra Come, MD;  Location: [facility];  Service: Urology;  Laterality: Right;   TONSILLECTOMY AND ADENOIDECTOMY     VAGINAL HYSTERECTOMY Bilateral 03/16/2017   Procedure: HYSTERECTOMY VAGINAL Bilateral Salpingectomy ;  Surgeon: Osborn Coho, MD;  Location: [facility];  Service: Gynecology;  Laterality: Bilateral;    OB History     Gravida  2   Para  2   Term  2   Preterm      AB      Living  2      SAB      IAB      Ectopic      Multiple      Live Births  2            Home Medications    Prior to Admission medications   Medication Sig Start Date End Date Taking? Authorizing Provider  bisoprolol-hydrochlorothiazide (ZIAC) 2.5-6.25 MG tablet Take 1 tablet by mouth daily. 09/19/22  Yes Abernathy, Alyssa, NP  buPROPion (WELLBUTRIN XL) 150 MG 24 hr tablet TAKE 1 TABLET BY MOUTH EVERY DAY 01/15/23  Yes Abernathy, Alyssa, NP  cefdinir (OMNICEF) 300 MG capsule Take 1 capsule (300 mg total) by mouth 2 (two) times daily for 10 days. 01/29/23 02/08/23 Yes Mickie Bail, NP  Cranberry-Vitamin C (CRANBERRY CONCENTRATE/VITAMINC) 15000-100 MG CAPS Take by mouth.   Yes [provider]  cyanocobalamin (VITAMIN B12) 1000 MCG/ML injection INJECT 1 ML (1,000 MCG TOTAL) INTO THE MUSCLE EVERY 30 DAYS. 01/19/23  Yes Abernathy, Arlyss Repress, NP  eszopiclone (LUNESTA) 2 MG TABS tablet Take 1 tablet (2 mg total) by mouth at bedtime as needed for sleep. Take immediately before bedtime 12/11/22  Yes Abernathy, Alyssa, NP  montelukast (SINGULAIR) 10 MG tablet TAKE 1 TABLET BY MOUTH EVERYDAY AT BEDTIME 09/19/22  Yes Abernathy, Alyssa, NP  phentermine (ADIPEX-P) 37.5 MG tablet Take 1 tablet (37.5 mg total) by mouth daily before breakfast. May take 1/2 tablet if whole not tolerated. 12/11/22  Yes Abernathy, Arlyss Repress, NP  rosuvastatin (CRESTOR) 5 MG tablet Take 1 tablet (5 mg total) by mouth daily. 12/11/22  Yes Sallyanne Kuster, NP    Family History Family History  Problem Relation Age of Onset   Cancer Other        breast   Cancer Mother        cervical   Depression Mother    Drug abuse Mother    Anemia Mother        blood transfusion   Migraines Mother     Social History Social History   Tobacco Use   Smoking status: Never   Smokeless tobacco: Never  Vaping Use   Vaping status: Never Used  Substance Use Topics   Alcohol use: No   Drug use: No     Allergies   Other, Percocet [oxycodone-acetaminophen], Sulfa antibiotics, and Sulfonamide derivatives   Review of Systems Review of Systems  Constitutional:  Negative for chills and fever.  HENT:  Positive for congestion, ear pain, postnasal drip and sore throat.   Respiratory:  Positive for cough. Negative for shortness of breath.      Physical Exam Triage Vital Signs ED Triage Vitals  Encounter Vitals Group     BP 01/29/23 1548 117/77     Systolic BP Percentile --      Diastolic BP Percentile --      Pulse Rate 01/29/23 1548 82     Resp 01/29/23 1548 18     Temp 01/29/23 1548 98.7 F (37.1 C)     Temp src --      SpO2 01/29/23 1548 97 %     Weight --      Height --      Head Circumference --      Peak Flow --      Pain Score 01/29/23 1552 8     Pain Loc --      Pain Education --      Exclude from Growth Chart --    No data found.  Updated Vital Signs BP 117/77   Pulse 82   Temp 98.7 F (37.1 C)   Resp 18   LMP 02/23/2017 (Exact Date)   SpO2 97%   Visual Acuity Right Eye Distance:   Left Eye Distance:   Bilateral Distance:    Right Eye Near:   Left Eye Near:    Bilateral Near:     Physical Exam Constitutional:      General: She is not in acute distress. HENT:     Right Ear: Tympanic membrane normal.     Left Ear: Tympanic membrane is erythematous.     Nose: Congestion and rhinorrhea present.     Mouth/Throat:     Mouth: Mucous membranes are moist.     Pharynx: Posterior oropharyngeal erythema  present.  Cardiovascular:     Rate and Rhythm: Normal rate and regular rhythm.     Heart sounds: Normal heart sounds.  Pulmonary:     Effort: Pulmonary effort is normal. No respiratory distress.     Breath sounds: Normal breath sounds.  Skin:    General: Skin is warm and dry.  Neurological:     Mental Status: She is alert.      UC Treatments / Results  Labs (all labs ordered are listed, but only abnormal results are displayed) Labs Reviewed  POCT RAPID STREP A (OFFICE)    EKG   Radiology No results found.  Procedures Procedures (including critical care time)  Medications Ordered in UC Medications - No data to display  Initial Impression / Assessment and Plan / UC Course  I have reviewed the triage vital signs and the nursing notes.  Pertinent labs & imaging results that were available during my care of the patient were reviewed by me and considered in my medical decision making (see chart for details).   Left otitis media, acute sinusitis, sore throat.  Patient was treated with Augmentin for sinus infection in August.  Treating today with cefdinir.  Discussed symptomatic care including plain Mucinex and ibuprofen.  Instructed patient to follow-up with her PCP.  Education provided on otitis media and sinus infection.  Patient agrees to plan of care.    Final Clinical Impressions(s) / UC Diagnoses   Final diagnoses:  Left otitis media, unspecified otitis media type  Acute non-recurrent maxillary sinusitis  Sore throat     Discharge Instructions      Take the cefdinir as directed.  Follow up with  your primary care provider.        ED Prescriptions     Medication Sig Dispense Auth. Provider   cefdinir (OMNICEF) 300 MG capsule Take 1 capsule (300 mg total) by mouth 2 (two) times daily for 10 days. 20 capsule Mickie Bail, NP      PDMP not reviewed this encounter.   Mickie Bail, NP 01/29/23 [phone]

## 2023-02-12 ENCOUNTER — Encounter: Payer: Self-pay | Admitting: Nurse Practitioner

## 2023-02-12 ENCOUNTER — Ambulatory Visit (INDEPENDENT_AMBULATORY_CARE_PROVIDER_SITE_OTHER): Payer: BC Managed Care – PPO | Admitting: Nurse Practitioner

## 2023-02-12 VITALS — BP 118/80 | HR 74 | Temp 98.3°F | Resp 16 | Ht 63.0 in | Wt 144.8 lb

## 2023-02-12 DIAGNOSIS — J0110 Acute frontal sinusitis, unspecified: Secondary | ICD-10-CM | POA: Diagnosis not present

## 2023-02-12 MED ORDER — BENZONATATE 200 MG PO CAPS: 200.0000 mg | ORAL_CAPSULE | Freq: Three times a day (TID) | ORAL | 2 refills | Status: DC | PRN

## 2023-02-12 MED ORDER — DOXYCYCLINE HYCLATE 100 MG PO TABS: 100.0000 mg | ORAL_TABLET | Freq: Two times a day (BID) | ORAL | 0 refills | Status: AC

## 2023-02-12 NOTE — Progress Notes (Signed)
[facility] [address] [place], [place] [address]  Internal MEDICINE  Office Visit Note  Patient Name: Kayla Jimenez  [id number]  [id number]  Date of Service: 02/12/2023  Chief Complaint  Patient presents with   Acute Visit    Losing voice     HPI Lolita presents for an acute sick visit for upper respiratory infection Onset of symptoms was prior to 10/24, went to urgent care, treated with cefdinir. Noticed some improvement at first but then it did not get any better.  Hoarseness is significant and worsens throughout the day until she loses her voice completely --reports sinus drainage, cough,  Previously had yellow drainage   Current Medication:  Outpatient Encounter Medications as of 02/12/2023  Medication Sig   benzonatate (TESSALON) 200 MG capsule Take 1 capsule (200 mg total) by mouth 3 (three) times daily as needed for cough.   bisoprolol-hydrochlorothiazide (ZIAC) 2.5-6.25 MG tablet Take 1 tablet by mouth daily.   buPROPion (WELLBUTRIN XL) 150 MG 24 hr tablet TAKE 1 TABLET BY MOUTH EVERY DAY   Cranberry-Vitamin C (CRANBERRY CONCENTRATE/VITAMINC) 15000-100 MG CAPS Take by mouth.   cyanocobalamin (VITAMIN B12) 1000 MCG/ML injection INJECT 1 ML (1,000 MCG TOTAL) INTO THE MUSCLE EVERY 30 DAYS.   doxycycline (VIBRA-TABS) 100 MG tablet Take 1 tablet (100 mg total) by mouth 2 (two) times daily for 10 days. Take with food   eszopiclone (LUNESTA) 2 MG TABS tablet Take 1 tablet (2 mg total) by mouth at bedtime as needed for sleep. Take immediately before bedtime   montelukast (SINGULAIR) 10 MG tablet TAKE 1 TABLET BY MOUTH EVERYDAY AT BEDTIME   phentermine (ADIPEX-P) 37.5 MG tablet Take 1 tablet (37.5 mg total) by mouth daily before breakfast. May take 1/2 tablet if whole not tolerated.   rosuvastatin (CRESTOR) 5 MG tablet Take 1 tablet (5 mg total) by mouth daily.   No facility-administered encounter medications on file as of 02/12/2023.      Medical  History: Past Medical History:  Diagnosis Date   Anxiety 08/29/10   Bursitis    left foot   BV (bacterial vaginosis) 08/29/10   Complication of anesthesia 2004   vomited   Gross hematuria 07/08/2015   H/O bladder infections    H/O mumps    H/O toxoplasmosis    H/O varicella    History of kidney stones    Infections of kidney    several kidney stones, bladder infections   Kidney disease 09/03/2011   Left ureteral calculus with partial obstruction, passed 06/09/2014. CT did show perinephric stranding.    Menometrorrhagia 06/26/10   Plantar fasciitis    PMS (premenstrual syndrome) 07/10/11   Post - coital bleeding 08/29/10   Renal calculus, left    Sinus infection    UTI (lower urinary tract infection) 07/08/2015   Yeast infection      Vital Signs: BP 118/80   Pulse 74   Temp 98.3 F (36.8 C)   Resp 16   Ht 5\' 3"  (1.6 m)   Wt 144 lb 12.8 oz (65.7 kg)   LMP 02/23/2017 (Exact Date)   SpO2 99%   BMI 25.65 kg/m    Review of Systems  Constitutional:  Positive for chills and fatigue. Negative for appetite change and fever.  HENT:  Positive for congestion, postnasal drip, rhinorrhea, sinus pressure, sinus pain, sneezing, sore throat and trouble swallowing. Negative for ear pain.   Respiratory:  Positive for cough and chest tightness. Negative for shortness of breath and  wheezing.   Cardiovascular:  Negative for chest pain and palpitations.  Gastrointestinal:  Negative for diarrhea, nausea and vomiting.  Neurological:  Positive for headaches.    Physical Exam Vitals reviewed.  Constitutional:      General: She is not in acute distress.    Appearance: Normal appearance. She is ill-appearing.  HENT:     Head: Normocephalic and atraumatic.     Right Ear: Tympanic membrane, ear canal and external ear normal.     Left Ear: Tympanic membrane, ear canal and external ear normal.     Nose: Mucosal edema, congestion and rhinorrhea present.     Right Turbinates: Swollen and pale.      Left Turbinates: Swollen and pale.     Right Sinus: Maxillary sinus tenderness and frontal sinus tenderness present.     Left Sinus: Maxillary sinus tenderness and frontal sinus tenderness present.     Mouth/Throat:     Mouth: Mucous membranes are moist.     Pharynx: Posterior oropharyngeal erythema present.  Eyes:     Pupils: Pupils are equal, round, and reactive to light.  Cardiovascular:     Rate and Rhythm: Normal rate and regular rhythm.     Heart sounds: Normal heart sounds. No murmur heard. Pulmonary:     Effort: Pulmonary effort is normal. No respiratory distress.     Breath sounds: Normal breath sounds.  Neurological:     Mental Status: She is alert and oriented to person, place, and time.  Psychiatric:        Mood and Affect: Mood normal.        Behavior: Behavior normal.       Assessment/Plan: 1. Acute non-recurrent frontal sinusitis (Primary) Doxycycline prescribed, take until gone. Cough medication also prescribed to help alleviate symptoms.  - doxycycline (VIBRA-TABS) 100 MG tablet; Take 1 tablet (100 mg total) by mouth 2 (two) times daily for 10 days. Take with food  Dispense: 20 tablet; Refill: 0 - benzonatate (TESSALON) 200 MG capsule; Take 1 capsule (200 mg total) by mouth 3 (three) times daily as needed for cough.  Dispense: 30 capsule; Refill: 2   General Counseling: Sydney verbalizes understanding of the findings of todays visit and agrees with plan of treatment. I have discussed any further diagnostic evaluation that may be needed or ordered today. We also reviewed her medications today. she has been encouraged to call the office with any questions or concerns that should arise related to todays visit.    Counseling:    No orders of the defined types were placed in this encounter.   Meds ordered this encounter  Medications   doxycycline (VIBRA-TABS) 100 MG tablet    Sig: Take 1 tablet (100 mg total) by mouth 2 (two) times daily for 10 days. Take with  food    Dispense:  20 tablet    Refill:  0   benzonatate (TESSALON) 200 MG capsule    Sig: Take 1 capsule (200 mg total) by mouth 3 (three) times daily as needed for cough.    Dispense:  30 capsule    Refill:  2    Fill script today asap thanks    Return if symptoms worsen or fail to improve.   Controlled Substance Database was reviewed by me for overdose risk score (ORS)  Time spent:30 Minutes Time spent with patient included reviewing progress notes, labs, imaging studies, and discussing plan for follow up.   This patient was seen by Sallyanne Kuster, FNP-C in collaboration with  Dr. Beverely Risen as a part of collaborative care agreement.  Ocean Schildt R. Tedd Sias, MSN, FNP-C Internal Medicine

## 2023-03-04 ENCOUNTER — Encounter: Payer: Self-pay | Admitting: Nurse Practitioner

## 2023-03-04 ENCOUNTER — Ambulatory Visit (INDEPENDENT_AMBULATORY_CARE_PROVIDER_SITE_OTHER): Payer: BC Managed Care – PPO | Admitting: Nurse Practitioner

## 2023-03-04 VITALS — BP 120/68 | HR 76 | Temp 98.3°F | Resp 16 | Ht 63.0 in | Wt 144.4 lb

## 2023-03-04 DIAGNOSIS — G4709 Other insomnia: Secondary | ICD-10-CM | POA: Diagnosis not present

## 2023-03-04 DIAGNOSIS — I10 Essential (primary) hypertension: Secondary | ICD-10-CM

## 2023-03-04 DIAGNOSIS — H60391 Other infective otitis externa, right ear: Secondary | ICD-10-CM | POA: Diagnosis not present

## 2023-03-04 DIAGNOSIS — E663 Overweight: Secondary | ICD-10-CM

## 2023-03-04 DIAGNOSIS — E782 Mixed hyperlipidemia: Secondary | ICD-10-CM

## 2023-03-04 DIAGNOSIS — Z6826 Body mass index (BMI) 26.0-26.9, adult: Secondary | ICD-10-CM

## 2023-03-04 DIAGNOSIS — J0110 Acute frontal sinusitis, unspecified: Secondary | ICD-10-CM | POA: Diagnosis not present

## 2023-03-04 DIAGNOSIS — J301 Allergic rhinitis due to pollen: Secondary | ICD-10-CM

## 2023-03-04 MED ORDER — BISOPROLOL-HYDROCHLOROTHIAZIDE 2.5-6.25 MG PO TABS: 1.0000 | ORAL_TABLET | Freq: Every day | ORAL | 1 refills | Status: DC

## 2023-03-04 MED ORDER — ROSUVASTATIN CALCIUM 5 MG PO TABS
5.0000 mg | ORAL_TABLET | Freq: Every day | ORAL | 1 refills | Status: DC
Start: 2023-03-04 — End: 2023-06-10

## 2023-03-04 MED ORDER — MONTELUKAST SODIUM 10 MG PO TABS: ORAL_TABLET | ORAL | 1 refills | Status: DC

## 2023-03-04 MED ORDER — ESZOPICLONE 2 MG PO TABS
2.0000 mg | ORAL_TABLET | Freq: Every evening | ORAL | 2 refills | Status: DC | PRN
Start: 2023-03-04 — End: 2023-06-10

## 2023-03-04 MED ORDER — PREDNISONE 10 MG (21) PO TBPK: ORAL_TABLET | ORAL | 0 refills | Status: DC

## 2023-03-04 MED ORDER — PHENTERMINE HCL 37.5 MG PO TABS
37.5000 mg | ORAL_TABLET | Freq: Every day | ORAL | 2 refills | Status: DC
Start: 2023-03-04 — End: 2023-06-10

## 2023-03-04 MED ORDER — CIPROFLOXACIN-DEXAMETHASONE 0.3-0.1 % OT SUSP
4.0000 [drp] | Freq: Two times a day (BID) | OTIC | 0 refills | Status: AC
Start: 2023-03-04 — End: 2023-03-09

## 2023-03-04 NOTE — Progress Notes (Signed)
[facility] [address] [place], [place] [address]  Internal MEDICINE  Office Visit Note  Patient Name: Kayla Jimenez  [id number]  [id number]  Date of Service: 03/04/2023  Chief Complaint  Patient presents with   Follow-up    Recheck ears    HPI Lakeyn presents for a follow-up visit for insomnia, weight loss, right ear itching and high cholesterol and hypertension.  Weight loss -- lost 3 lbs since September office visit, and patient also reports losing more inches, clothes are fitting better.  Insomnia -- takes lunesta, remains effective, due for refills Right ear itching -- itching and pressure, worried she has an ear infection again.  Hypertension -- controlled with bisoprolol-hydrochlorothiazide High cholesterol -- taking rosuvastatin    Current Medication: Outpatient Encounter Medications as of 03/04/2023  Medication Sig   benzonatate (TESSALON) 200 MG capsule Take 1 capsule (200 mg total) by mouth 3 (three) times daily as needed for cough.   buPROPion (WELLBUTRIN XL) 150 MG 24 hr tablet TAKE 1 TABLET BY MOUTH EVERY DAY   ciprofloxacin-dexamethasone (CIPRODEX) OTIC suspension Place 4 drops into the right ear 2 (two) times daily for 5 days.   Cranberry-Vitamin C (CRANBERRY CONCENTRATE/VITAMINC) 15000-100 MG CAPS Take by mouth.   cyanocobalamin (VITAMIN B12) 1000 MCG/ML injection INJECT 1 ML (1,000 MCG TOTAL) INTO THE MUSCLE EVERY 30 DAYS.   predniSONE (STERAPRED UNI-PAK 21 TAB) 10 MG (21) TBPK tablet Use as directed for 6 days   [DISCONTINUED] bisoprolol-hydrochlorothiazide (ZIAC) 2.5-6.25 MG tablet Take 1 tablet by mouth daily.   [DISCONTINUED] eszopiclone (LUNESTA) 2 MG TABS tablet Take 1 tablet (2 mg total) by mouth at bedtime as needed for sleep. Take immediately before bedtime   [DISCONTINUED] montelukast (SINGULAIR) 10 MG tablet TAKE 1 TABLET BY MOUTH EVERYDAY AT BEDTIME   [DISCONTINUED] phentermine (ADIPEX-P) 37.5 MG tablet Take 1 tablet (37.5 mg total)  by mouth daily before breakfast. May take 1/2 tablet if whole not tolerated.   [DISCONTINUED] rosuvastatin (CRESTOR) 5 MG tablet Take 1 tablet (5 mg total) by mouth daily.   bisoprolol-hydrochlorothiazide (ZIAC) 2.5-6.25 MG tablet Take 1 tablet by mouth daily.   eszopiclone (LUNESTA) 2 MG TABS tablet Take 1 tablet (2 mg total) by mouth at bedtime as needed for sleep. Take immediately before bedtime   montelukast (SINGULAIR) 10 MG tablet TAKE 1 TABLET BY MOUTH EVERYDAY AT BEDTIME   phentermine (ADIPEX-P) 37.5 MG tablet Take 1 tablet (37.5 mg total) by mouth daily before breakfast. May take 1/2 tablet if whole not tolerated.   rosuvastatin (CRESTOR) 5 MG tablet Take 1 tablet (5 mg total) by mouth daily.   No facility-administered encounter medications on file as of 03/04/2023.    Surgical History: Past Surgical History:  Procedure Laterality Date   ABDOMINAL HYSTERECTOMY     ANTERIOR AND POSTERIOR REPAIR  03/16/2012   Procedure: ANTERIOR (CYSTOCELE) AND POSTERIOR REPAIR (RECTOCELE);  Surgeon: Purcell Nails, MD;  Location: [facility];  Service: Gynecology;  Laterality: N/A;  anterior repair/cysto   BLADDER SUSPENSION  03/16/2012   Procedure: TRANSVAGINAL TAPE (TVT) PROCEDURE;  Surgeon: Purcell Nails, MD;  Location: [facility];  Service: Gynecology;  Laterality: N/A;   BREAST REDUCTION SURGERY  6/04   CHOLECYSTECTOMY  06/28/14   CYSTOSCOPY  03/16/2012   Procedure: CYSTOSCOPY;  Surgeon: Purcell Nails, MD;  Location: [facility];  Service: Gynecology;  Laterality: N/A;   CYSTOSCOPY  03/16/2017   Procedure: CYSTOSCOPY;  Surgeon: Osborn Coho, MD;  Location: [facility];  Service: Gynecology;;  DILITATION & CURRETTAGE/HYSTROSCOPY WITH NOVASURE ABLATION  03/16/2012   Procedure: DILATATION & CURETTAGE/HYSTEROSCOPY WITH NOVASURE ABLATION;  Surgeon: Purcell Nails, MD;  Location: [facility];  Service: Gynecology;  Laterality: N/A;   EXTRACORPOREAL SHOCK WAVE LITHOTRIPSY Right 07/18/2021   Procedure:  EXTRACORPOREAL SHOCK WAVE LITHOTRIPSY (ESWL);  Surgeon: Sondra Come, MD;  Location: [facility];  Service: Urology;  Laterality: Right;   TONSILLECTOMY AND ADENOIDECTOMY     VAGINAL HYSTERECTOMY Bilateral 03/16/2017   Procedure: HYSTERECTOMY VAGINAL Bilateral Salpingectomy ;  Surgeon: Osborn Coho, MD;  Location: [facility];  Service: Gynecology;  Laterality: Bilateral;    Medical History: Past Medical History:  Diagnosis Date   Anxiety 08/29/10   Bursitis    left foot   BV (bacterial vaginosis) 08/29/10   Complication of anesthesia 2004   vomited   Gross hematuria 07/08/2015   H/O bladder infections    H/O mumps    H/O toxoplasmosis    H/O varicella    History of kidney stones    Infections of kidney    several kidney stones, bladder infections   Kidney disease 09/03/2011   Left ureteral calculus with partial obstruction, passed 06/09/2014. CT did show perinephric stranding.    Menometrorrhagia 06/26/10   Plantar fasciitis    PMS (premenstrual syndrome) 07/10/11   Post - coital bleeding 08/29/10   Renal calculus, left    Sinus infection    UTI (lower urinary tract infection) 07/08/2015   Yeast infection     Family History: Family History  Problem Relation Age of Onset   Cancer Other        breast   Cancer Mother        cervical   Depression Mother    Drug abuse Mother    Anemia Mother        blood transfusion   Migraines Mother     Social History   Socioeconomic History   Marital status: Married    Spouse name: Not on file   Number of children: Not on file   Years of education: Not on file   Highest education level: Not on file  Occupational History   Not on file  Tobacco Use   Smoking status: Never   Smokeless tobacco: Never  Vaping Use   Vaping status: Never Used  Substance and Sexual Activity   Alcohol use: No   Drug use: No   Sexual activity: Yes    Birth control/protection: Surgical    Comment: pt spouse had vasec.   Other Topics Concern   Not on  file  Social History Narrative   Not on file   Social Determinants of Health   Financial Resource Strain: Not on file  Food Insecurity: Not on file  Transportation Needs: Not on file  Physical Activity: Not on file  Stress: Not on file  Social Connections: Not on file  Intimate Partner Violence: Not on file      Review of Systems  Constitutional:  Positive for appetite change and unexpected weight change. Negative for chills and fatigue.  HENT: Negative.  Negative for congestion, rhinorrhea, sneezing and sore throat.   Eyes:  Negative for redness.  Respiratory: Negative.  Negative for cough, chest tightness, shortness of breath and wheezing.   Cardiovascular: Negative.  Negative for chest pain and palpitations.  Gastrointestinal:  Negative for abdominal pain, constipation, diarrhea, nausea and vomiting.  Genitourinary:  Negative for dysuria and frequency.  Musculoskeletal:  Negative for arthralgias, back pain, joint swelling and neck pain.  Skin:  Negative for rash.  Neurological: Negative.  Negative for tremors and numbness.  Hematological:  Negative for adenopathy. Does not bruise/bleed easily.  Psychiatric/Behavioral:  Positive for behavioral problems (Depression -- takes bupropion) and sleep disturbance (takes lunesta). Negative for decreased concentration, self-injury and suicidal ideas. The patient is nervous/anxious.     Vital Signs: BP 120/68   Pulse 76   Temp 98.3 F (36.8 C)   Resp 16   Ht 5\' 3"  (1.6 m)   Wt 144 lb 6.4 oz (65.5 kg)   LMP 02/23/2017 (Exact Date)   SpO2 96%   BMI 25.58 kg/m    Physical Exam Vitals reviewed.  Constitutional:      General: She is not in acute distress.    Appearance: Normal appearance. She is not ill-appearing.  HENT:     Head: Normocephalic and atraumatic.     Right Ear: Drainage, swelling and tenderness present.  Eyes:     Pupils: Pupils are equal, round, and reactive to light.  Cardiovascular:     Rate and Rhythm:  Normal rate and regular rhythm.  Pulmonary:     Effort: Pulmonary effort is normal. No respiratory distress.  Neurological:     Mental Status: She is alert and oriented to person, place, and time.  Psychiatric:        Mood and Affect: Mood normal.        Behavior: Behavior normal.        Assessment/Plan: 1. Other infective acute otitis externa of right ear Take ciprodex ear drops as prescribed.  - ciprofloxacin-dexamethasone (CIPRODEX) OTIC suspension; Place 4 drops into the right ear 2 (two) times daily for 5 days.  Dispense: 7.5 mL; Refill: 0  2. Acute non-recurrent frontal sinusitis Prednisone taper prescribed.  - predniSONE (STERAPRED UNI-PAK 21 TAB) 10 MG (21) TBPK tablet; Use as directed for 6 days  Dispense: 21 tablet; Refill: 0  3. Other insomnia Continue lunesta as prescribed.  - eszopiclone (LUNESTA) 2 MG TABS tablet; Take 1 tablet (2 mg total) by mouth at bedtime as needed for sleep. Take immediately before bedtime  Dispense: 30 tablet; Refill: 2  4. Benign hypertension Stable, continue bisoprolol-hydrochlorothiazide as prescribed.  - bisoprolol-hydrochlorothiazide (ZIAC) 2.5-6.25 MG tablet; Take 1 tablet by mouth daily.  Dispense: 90 tablet; Refill: 1  5. Mixed hyperlipidemia Continue rosuvastatin as prescribed.  - rosuvastatin (CRESTOR) 5 MG tablet; Take 1 tablet (5 mg total) by mouth daily.  Dispense: 90 tablet; Refill: 1  6. Allergic rhinitis due to pollen, unspecified seasonality Continue montelukast as prescribed.  - montelukast (SINGULAIR) 10 MG tablet; TAKE 1 TABLET BY MOUTH EVERYDAY AT BEDTIME  Dispense: 90 tablet; Refill: 1  7. Overweight with body mass index (BMI) of 26 to 26.9 in adult Continue phentermine as prescribed. Follow up in 3 months.  - phentermine (ADIPEX-P) 37.5 MG tablet; Take 1 tablet (37.5 mg total) by mouth daily before breakfast. May take 1/2 tablet if whole not tolerated.  Dispense: 30 tablet; Refill: 2   General Counseling: Cariana  verbalizes understanding of the findings of todays visit and agrees with plan of treatment. I have discussed any further diagnostic evaluation that may be needed or ordered today. We also reviewed her medications today. she has been encouraged to call the office with any questions or concerns that should arise related to todays visit.    No orders of the defined types were placed in this encounter.   Meds ordered this encounter  Medications   predniSONE (STERAPRED UNI-PAK  21 TAB) 10 MG (21) TBPK tablet    Sig: Use as directed for 6 days    Dispense:  21 tablet    Refill:  0   eszopiclone (LUNESTA) 2 MG TABS tablet    Sig: Take 1 tablet (2 mg total) by mouth at bedtime as needed for sleep. Take immediately before bedtime    Dispense:  30 tablet    Refill:  2    For future refills   phentermine (ADIPEX-P) 37.5 MG tablet    Sig: Take 1 tablet (37.5 mg total) by mouth daily before breakfast. May take 1/2 tablet if whole not tolerated.    Dispense:  30 tablet    Refill:  2   bisoprolol-hydrochlorothiazide (ZIAC) 2.5-6.25 MG tablet    Sig: Take 1 tablet by mouth daily.    Dispense:  90 tablet    Refill:  1   rosuvastatin (CRESTOR) 5 MG tablet    Sig: Take 1 tablet (5 mg total) by mouth daily.    Dispense:  90 tablet    Refill:  1   montelukast (SINGULAIR) 10 MG tablet    Sig: TAKE 1 TABLET BY MOUTH EVERYDAY AT BEDTIME    Dispense:  90 tablet    Refill:  1   ciprofloxacin-dexamethasone (CIPRODEX) OTIC suspension    Sig: Place 4 drops into the right ear 2 (two) times daily for 5 days.    Dispense:  7.5 mL    Refill:  0    Fill new script now.    Return in about 3 months (around 05/28/2023) for F/U, Weight loss, med refill, Zorawar Strollo PCP weightloss and lunesta .   Total time spent:30 Minutes Time spent includes review of chart, medications, test results, and follow up plan with the patient.   [place] Controlled Substance Database was reviewed by me.  This patient was seen by Sallyanne Kuster, FNP-C in collaboration with Dr. Beverely Risen as a part of collaborative care agreement.   Mackayla Mullins R. Tedd Sias, MSN, FNP-C Internal medicine

## 2023-05-20 ENCOUNTER — Telehealth: Payer: Self-pay | Admitting: Nurse Practitioner

## 2023-05-20 NOTE — Telephone Encounter (Signed)
Left vm and sent mychart message to confirm 05/28/23 appointment-Toni

## 2023-05-23 ENCOUNTER — Ambulatory Visit
Admission: EM | Admit: 2023-05-23 | Discharge: 2023-05-23 | Disposition: A | Discharge status: Home / Self Care | Payer: 59 | Attending: Nurse Practitioner | Admitting: Nurse Practitioner

## 2023-05-23 ENCOUNTER — Encounter: Payer: Self-pay | Admitting: Emergency Medicine

## 2023-05-23 DIAGNOSIS — J0100 Acute maxillary sinusitis, unspecified: Secondary | ICD-10-CM

## 2023-05-23 MED ORDER — PROMETHAZINE-DM 6.25-15 MG/5ML PO SYRP: 5.0000 mL | ORAL_SOLUTION | Freq: Four times a day (QID) | ORAL | 0 refills | Status: DC | PRN

## 2023-05-23 MED ORDER — AMOXICILLIN-POT CLAVULANATE 875-125 MG PO TABS: 1.0000 | ORAL_TABLET | Freq: Two times a day (BID) | ORAL | 0 refills | Status: DC

## 2023-05-23 MED ORDER — FLUTICASONE PROPIONATE 50 MCG/ACT NA SUSP
2.0000 | Freq: Every day | NASAL | 0 refills | Status: AC
Start: 2023-05-23 — End: ?

## 2023-05-23 NOTE — Discharge Instructions (Addendum)
 Take medication as directed. Increase fluids and get plenty of rest. May take over-the-counter ibuprofen or Tylenol as needed for pain, fever, or general discomfort. Recommend normal saline nasal spray to help with nasal congestion throughout the day. For your cough, it may be helpful to use a humidifier at bedtime and sleeping elevated while cough persists. If symptoms fail to improve with this treatment, you may follow-up with your PCP for further evaluation. Follow-up as needed.

## 2023-05-23 NOTE — ED Triage Notes (Signed)
 Cough, nasal congestion, ear pain x 1 week.  Has been taking mucinex.

## 2023-05-23 NOTE — ED Provider Notes (Signed)
 [facility]    CSN: [id number] Arrival date & time: 05/23/23  1049      History   Chief Complaint No chief complaint on file.   HPI Kayla Jimenez is a 48 y.o. female.   The history is provided by the patient.   Patient presents with a more than 1 week history of nasal congestion, cough,, postnasal drainage, and bilateral ear pain.  Denies fever, chills, ear drainage, wheezing, difficulty breathing, chest pain, abdominal pain, nausea, vomiting, diarrhea, or rash.  Patient reports she has been taking Mucinex for symptoms with minimal relief.  Past Medical History:  Diagnosis Date   Anxiety 08/29/10   Bursitis    left foot   BV (bacterial vaginosis) 08/29/10   Complication of anesthesia 2004   vomited   Gross hematuria 07/08/2015   H/O bladder infections    H/O mumps    H/O toxoplasmosis    H/O varicella    History of kidney stones    Infections of kidney    several kidney stones, bladder infections   Kidney disease 09/03/2011   Left ureteral calculus with partial obstruction, passed 06/09/2014. CT did show perinephric stranding.    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   Loss of hair 10/07/2018   Body mass index (BMI) of 25.0 to 29.9 10/07/2018   Hematuria 06/28/2018   Right flank pain 06/28/2018   H/O: hysterectomy 11/19/2017   Dysuria 10/20/2017   Allergic rhinitis due to pollen 08/12/2017   Attention and  concentration deficit 08/12/2017   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   Other insomnia 08/12/2017   Irregular bleeding 03/16/2017   Plantar fasciitis 03/10/2017   SUI (stress urinary incontinence, female) 08/14/2015   Cystocele, midline 08/14/2015   Renal calculi 07/26/2015   Urinary tract infection with hematuria 07/08/2015   Gross hematuria 07/08/2015   History of renal stone 07/08/2015   Abdominal bloating 07/10/2014   Gallstones 06/13/2014   Yeast infection 09/03/2011   H/O varicella 09/03/2011   H/O mumps 09/03/2011   Postcoital bleeding 09/03/2011   Anxiety 09/03/2011   Premenstrual tension syndrome 09/03/2011   H/O bladder infections 09/03/2011   Kidney disease 09/03/2011   Menorrhagia 07/10/2011    Past Surgical History:  Procedure Laterality Date   ABDOMINAL HYSTERECTOMY     ANTERIOR AND POSTERIOR REPAIR  03/16/2012   Procedure: ANTERIOR (CYSTOCELE) AND POSTERIOR REPAIR (RECTOCELE);  Surgeon: Purcell Nails, MD;  Location: [facility];  Service: Gynecology;  Laterality: N/A;  anterior repair/cysto   BLADDER SUSPENSION  03/16/2012   Procedure: TRANSVAGINAL TAPE (TVT) PROCEDURE;  Surgeon: Purcell Nails, MD;  Location: [facility];  Service: Gynecology;  Laterality: N/A;   BREAST REDUCTION SURGERY  6/04   CHOLECYSTECTOMY  06/28/14   CYSTOSCOPY  03/16/2012   Procedure: CYSTOSCOPY;  Surgeon: Purcell Nails, MD;  Location: [facility];  Service: Gynecology;  Laterality: N/A;   CYSTOSCOPY  03/16/2017  Procedure: CYSTOSCOPY;  Surgeon: Osborn Coho, MD;  Location: [facility];  Service: Gynecology;;   DILITATION & CURRETTAGE/HYSTROSCOPY WITH NOVASURE ABLATION  03/16/2012   Procedure: DILATATION & CURETTAGE/HYSTEROSCOPY WITH NOVASURE ABLATION;  Surgeon: Purcell Nails, MD;  Location: [facility];  Service: Gynecology;  Laterality: N/A;   EXTRACORPOREAL SHOCK WAVE LITHOTRIPSY Right 07/18/2021   Procedure: EXTRACORPOREAL SHOCK WAVE LITHOTRIPSY (ESWL);  Surgeon: Sondra Come, MD;   Location: [facility];  Service: Urology;  Laterality: Right;   TONSILLECTOMY AND ADENOIDECTOMY     VAGINAL HYSTERECTOMY Bilateral 03/16/2017   Procedure: HYSTERECTOMY VAGINAL Bilateral Salpingectomy ;  Surgeon: Osborn Coho, MD;  Location: [facility];  Service: Gynecology;  Laterality: Bilateral;    OB History     Gravida  2   Para  2   Term  2   Preterm      AB      Living  2      SAB      IAB      Ectopic      Multiple      Live Births  2            Home Medications    Prior to Admission medications   Medication Sig Start Date End Date Taking? Authorizing Provider  amoxicillin-clavulanate (AUGMENTIN) 875-125 MG tablet Take 1 tablet by mouth every 12 (twelve) hours. 05/23/23  Yes Leath-Warren, Sadie Haber, NP  fluticasone (FLONASE) 50 MCG/ACT nasal spray Place 2 sprays into both nostrils daily. 05/23/23  Yes Leath-Warren, Sadie Haber, NP  promethazine-dextromethorphan (PROMETHAZINE-DM) 6.25-15 MG/5ML syrup Take 5 mLs by mouth 4 (four) times daily as needed for cough. 05/23/23  Yes Leath-Warren, Sadie Haber, NP  bisoprolol-hydrochlorothiazide (ZIAC) 2.5-6.25 MG tablet Take 1 tablet by mouth daily. 03/04/23   Sallyanne Kuster, NP  buPROPion (WELLBUTRIN XL) 150 MG 24 hr tablet TAKE 1 TABLET BY MOUTH EVERY DAY 01/15/23   Sallyanne Kuster, NP  Cranberry-Vitamin C (CRANBERRY CONCENTRATE/VITAMINC) 15000-100 MG CAPS Take by mouth.    [provider]  cyanocobalamin (VITAMIN B12) 1000 MCG/ML injection INJECT 1 ML (1,000 MCG TOTAL) INTO THE MUSCLE EVERY 30 DAYS. 01/19/23   Sallyanne Kuster, NP  eszopiclone (LUNESTA) 2 MG TABS tablet Take 1 tablet (2 mg total) by mouth at bedtime as needed for sleep. Take immediately before bedtime 03/04/23   Sallyanne Kuster, NP  montelukast (SINGULAIR) 10 MG tablet TAKE 1 TABLET BY MOUTH EVERYDAY AT BEDTIME 03/04/23   Sallyanne Kuster, NP  phentermine (ADIPEX-P) 37.5 MG tablet Take 1 tablet (37.5 mg total) by mouth daily before  breakfast. May take 1/2 tablet if whole not tolerated. 03/04/23   Sallyanne Kuster, NP  rosuvastatin (CRESTOR) 5 MG tablet Take 1 tablet (5 mg total) by mouth daily. 03/04/23   Sallyanne Kuster, NP    Family History Family History  Problem Relation Age of Onset   Cancer Other        breast   Cancer Mother        cervical   Depression Mother    Drug abuse Mother    Anemia Mother        blood transfusion   Migraines Mother     Social History Social History   Tobacco Use   Smoking status: Never   Smokeless tobacco: Never  Vaping Use   Vaping status: Never Used  Substance Use Topics   Alcohol use: No   Drug use: No     Allergies   Other, Percocet [oxycodone-acetaminophen], Sulfa antibiotics, and Sulfonamide  derivatives   Review of Systems Review of Systems Per HPI  Physical Exam Triage Vital Signs ED Triage Vitals  Encounter Vitals Group     BP 05/23/23 1100 108/76     Systolic BP Percentile --      Diastolic BP Percentile --      Pulse Rate 05/23/23 1100 92     Resp 05/23/23 1100 18     Temp 05/23/23 1100 98.2 F (36.8 C)     Temp Source 05/23/23 1100 Oral     SpO2 05/23/23 1100 96 %     Weight --      Height --      Head Circumference --      Peak Flow --      Pain Score 05/23/23 1101 5     Pain Loc --      Pain Education --      Exclude from Growth Chart --    No data found.  Updated Vital Signs BP 108/76 (BP Location: Right Arm)   Pulse 92   Temp 98.2 F (36.8 C) (Oral)   Resp 18   LMP 02/23/2017 (Exact Date)   SpO2 96%   Visual Acuity Right Eye Distance:   Left Eye Distance:   Bilateral Distance:    Right Eye Near:   Left Eye Near:    Bilateral Near:     Physical Exam Vitals and nursing note reviewed.  Constitutional:      General: She is not in acute distress.    Appearance: Normal appearance.  HENT:     Head: Normocephalic.     Right Ear: Tympanic membrane, ear canal and external ear normal.     Left Ear: Tympanic  membrane, ear canal and external ear normal.     Nose: Congestion present.     Right Turbinates: Enlarged and swollen.     Left Turbinates: Enlarged and swollen.     Right Sinus: Maxillary sinus tenderness present. No frontal sinus tenderness.     Left Sinus: Maxillary sinus tenderness present. No frontal sinus tenderness.     Mouth/Throat:     Lips: Pink.     Mouth: Mucous membranes are moist.     Pharynx: Uvula midline. Postnasal drip present. No pharyngeal swelling, oropharyngeal exudate, posterior oropharyngeal erythema or uvula swelling.  Eyes:     Extraocular Movements: Extraocular movements intact.     Conjunctiva/sclera: Conjunctivae normal.     Pupils: Pupils are equal, round, and reactive to light.  Cardiovascular:     Rate and Rhythm: Normal rate and regular rhythm.     Pulses: Normal pulses.     Heart sounds: Normal heart sounds.  Pulmonary:     Effort: Pulmonary effort is normal. No respiratory distress.     Breath sounds: Normal breath sounds. No stridor. No wheezing, rhonchi or rales.  Abdominal:     General: Bowel sounds are normal.     Palpations: Abdomen is soft.     Tenderness: There is no abdominal tenderness.  Musculoskeletal:     Cervical back: Normal range of motion.  Lymphadenopathy:     Cervical: No cervical adenopathy.  Skin:    General: Skin is warm and dry.  Neurological:     General: No focal deficit present.     Mental Status: She is alert and oriented to person, place, and time.  Psychiatric:        Mood and Affect: Mood normal.        Behavior: Behavior normal.  UC Treatments / Results  Labs (all labs ordered are listed, but only abnormal results are displayed) Labs Reviewed - No data to display  EKG   Radiology No results found.  Procedures Procedures (including critical care time)  Medications Ordered in UC Medications - No data to display  Initial Impression / Assessment and Plan / UC Course  I have reviewed the triage  vital signs and the nursing notes.  Pertinent labs & imaging results that were available during my care of the patient were reviewed by me and considered in my medical decision making (see chart for details).  On exam, lung sounds are clear throughout, room air sats at 96%.  Patient with moderate nasal congestion and drainage.  Given duration of symptoms, and current presentation, will treat empirically for acute sinusitis with Augmentin 875/125 mg tablets for the next 7 days and fluticasone 50 mcg nasal spray.  For her cough, Promethazine DM was prescribed.  Supportive care recommendations were provided and discussed with the patient to include fluids, rest, over-the-counter analgesics, normal saline nasal spray and use of a humidifier at nighttime during sleep.  Discussed indications regarding follow-up.  Patient was in agreement with this plan of care and verbalizes understanding.  All questions were answered.  Patient stable for discharge.  Final Clinical Impressions(s) / UC Diagnoses   Final diagnoses:  Acute maxillary sinusitis, recurrence not specified     Discharge Instructions      Take medication as directed. Increase fluids and get plenty of rest. May take over-the-counter ibuprofen or Tylenol as needed for pain, fever, or general discomfort. Recommend normal saline nasal spray to help with nasal congestion throughout the day. For your cough, it may be helpful to use a humidifier at bedtime and sleeping elevated while cough persists. If symptoms fail to improve with this treatment, you may follow-up with your PCP for further evaluation. Follow-up as needed.       ED Prescriptions     Medication Sig Dispense Auth. Provider   amoxicillin-clavulanate (AUGMENTIN) 875-125 MG tablet Take 1 tablet by mouth every 12 (twelve) hours. 14 tablet Leath-Warren, Sadie Haber, NP   fluticasone (FLONASE) 50 MCG/ACT nasal spray Place 2 sprays into both nostrils daily. 16 g Leath-Warren,  Sadie Haber, NP   promethazine-dextromethorphan (PROMETHAZINE-DM) 6.25-15 MG/5ML syrup Take 5 mLs by mouth 4 (four) times daily as needed for cough. 118 mL Leath-Warren, Sadie Haber, NP      PDMP not reviewed this encounter.   Abran Cantor, NP 05/23/23 1126

## 2023-05-26 ENCOUNTER — Telehealth: Payer: Self-pay | Admitting: Nurse Practitioner

## 2023-05-26 NOTE — Telephone Encounter (Signed)
 Lvm & sent mychart msg to change 05/28/23 appointment to virtual-Toni

## 2023-05-28 ENCOUNTER — Encounter: Payer: BC Managed Care – PPO | Admitting: Nurse Practitioner

## 2023-06-02 ENCOUNTER — Ambulatory Visit: Payer: BC Managed Care – PPO | Admitting: Nurse Practitioner

## 2023-06-03 ENCOUNTER — Telehealth: Payer: Self-pay | Admitting: Nurse Practitioner

## 2023-06-03 NOTE — Telephone Encounter (Signed)
 Left vm and sent mychart message to confirm 06/10/23 appointment-Toni

## 2023-06-10 ENCOUNTER — Encounter: Payer: Self-pay | Admitting: Nurse Practitioner

## 2023-06-10 ENCOUNTER — Ambulatory Visit (INDEPENDENT_AMBULATORY_CARE_PROVIDER_SITE_OTHER): Payer: Self-pay | Admitting: Nurse Practitioner

## 2023-06-10 VITALS — BP 110/76 | HR 86 | Temp 98.3°F | Resp 16 | Ht 63.0 in | Wt 148.2 lb

## 2023-06-10 DIAGNOSIS — E782 Mixed hyperlipidemia: Secondary | ICD-10-CM

## 2023-06-10 DIAGNOSIS — G4709 Other insomnia: Secondary | ICD-10-CM | POA: Diagnosis not present

## 2023-06-10 DIAGNOSIS — Z6827 Body mass index (BMI) 27.0-27.9, adult: Secondary | ICD-10-CM

## 2023-06-10 DIAGNOSIS — J301 Allergic rhinitis due to pollen: Secondary | ICD-10-CM

## 2023-06-10 DIAGNOSIS — Z0001 Encounter for general adult medical examination with abnormal findings: Secondary | ICD-10-CM

## 2023-06-10 DIAGNOSIS — F321 Major depressive disorder, single episode, moderate: Secondary | ICD-10-CM

## 2023-06-10 DIAGNOSIS — E663 Overweight: Secondary | ICD-10-CM

## 2023-06-10 DIAGNOSIS — Z6826 Body mass index (BMI) 26.0-26.9, adult: Secondary | ICD-10-CM

## 2023-06-10 DIAGNOSIS — I10 Essential (primary) hypertension: Secondary | ICD-10-CM

## 2023-06-10 MED ORDER — MONTELUKAST SODIUM 10 MG PO TABS: ORAL_TABLET | ORAL | 1 refills | Status: DC

## 2023-06-10 MED ORDER — ESZOPICLONE 2 MG PO TABS: 2.0000 mg | ORAL_TABLET | Freq: Every evening | ORAL | 2 refills | Status: DC | PRN

## 2023-06-10 MED ORDER — ROSUVASTATIN CALCIUM 5 MG PO TABS: 5.0000 mg | ORAL_TABLET | Freq: Every day | ORAL | 1 refills | Status: DC

## 2023-06-10 MED ORDER — BISOPROLOL-HYDROCHLOROTHIAZIDE 2.5-6.25 MG PO TABS: 1.0000 | ORAL_TABLET | Freq: Every day | ORAL | 1 refills | Status: DC

## 2023-06-10 MED ORDER — BUPROPION HCL ER (XL) 150 MG PO TB24: 150.0000 mg | ORAL_TABLET | Freq: Every day | ORAL | 1 refills | Status: DC

## 2023-06-10 MED ORDER — PHENTERMINE HCL 37.5 MG PO TABS: 37.5000 mg | ORAL_TABLET | Freq: Every day | ORAL | 2 refills | Status: DC

## 2023-06-10 NOTE — Progress Notes (Signed)
 [facility] [address] [place], [place] [address]  Internal MEDICINE  Office Visit Note  Patient Name: Kayla Jimenez  [id number]  [id number]  Date of Service: 06/10/2023  Chief Complaint  Patient presents with   Annual Exam    HPI Kayla Jimenez presents for an annual well visit and physical exam.  Well-appearing 48 y.o. female with allergic rhinitis, kidney stones, gallstones, fatigue and insomnia.  Routine CRC screening: due in 2032 Routine mammogram: done in January, gets this done with [facility] in [place] TBSE done at [facility]  in December.  Labs: due in June this year  New or worsening pain: none  Other concerns: patient weighs 15 lbs less from her annual physical last year. Still taking phentermine  off and on. Does takes breaks from it.     Current Medication: Outpatient Encounter Medications as of 06/10/2023  Medication Sig   Cranberry-Vitamin C (CRANBERRY CONCENTRATE/VITAMINC) 15000-100 MG CAPS Take by mouth.   cyanocobalamin  (VITAMIN B12) 1000 MCG/ML injection INJECT 1 ML (1,000 MCG TOTAL) INTO THE MUSCLE EVERY 30 DAYS.   fluticasone  (FLONASE ) 50 MCG/ACT nasal spray Place 2 sprays into both nostrils daily.   [DISCONTINUED] amoxicillin -clavulanate (AUGMENTIN ) 875-125 MG tablet Take 1 tablet by mouth every 12 (twelve) hours.   [DISCONTINUED] bisoprolol -hydrochlorothiazide  (ZIAC ) 2.5-6.25 MG tablet Take 1 tablet by mouth daily.   [DISCONTINUED] buPROPion  (WELLBUTRIN  XL) 150 MG 24 hr tablet TAKE 1 TABLET BY MOUTH EVERY DAY   [DISCONTINUED] eszopiclone  (LUNESTA ) 2 MG TABS tablet Take 1 tablet (2 mg total) by mouth at bedtime as needed for sleep. Take immediately before bedtime   [DISCONTINUED] montelukast  (SINGULAIR ) 10 MG tablet TAKE 1 TABLET BY MOUTH EVERYDAY AT BEDTIME   [DISCONTINUED] phentermine  (ADIPEX-P ) 37.5 MG tablet Take 1 tablet (37.5 mg total) by mouth daily before breakfast. May take 1/2 tablet if whole not tolerated.    [DISCONTINUED] promethazine -dextromethorphan (PROMETHAZINE -DM) 6.25-15 MG/5ML syrup Take 5 mLs by mouth 4 (four) times daily as needed for cough.   [DISCONTINUED] rosuvastatin  (CRESTOR ) 5 MG tablet Take 1 tablet (5 mg total) by mouth daily.   bisoprolol -hydrochlorothiazide  (ZIAC ) 2.5-6.25 MG tablet Take 1 tablet by mouth daily.   buPROPion  (WELLBUTRIN  XL) 150 MG 24 hr tablet Take 1 tablet (150 mg total) by mouth daily.   eszopiclone  (LUNESTA ) 2 MG TABS tablet Take 1 tablet (2 mg total) by mouth at bedtime as needed for sleep. Take immediately before bedtime   montelukast  (SINGULAIR ) 10 MG tablet TAKE 1 TABLET BY MOUTH EVERYDAY AT BEDTIME   phentermine  (ADIPEX-P ) 37.5 MG tablet Take 1 tablet (37.5 mg total) by mouth daily before breakfast. May take 1/2 tablet if whole not tolerated.   rosuvastatin  (CRESTOR ) 5 MG tablet Take 1 tablet (5 mg total) by mouth daily.   No facility-administered encounter medications on file as of 06/10/2023.    Surgical History: Past Surgical History:  Procedure Laterality Date   ABDOMINAL HYSTERECTOMY     ANTERIOR AND POSTERIOR REPAIR  03/16/2012   Procedure: ANTERIOR (CYSTOCELE) AND POSTERIOR REPAIR (RECTOCELE);  Surgeon: Madelene Schanz, MD;  Location: [facility];  Service: Gynecology;  Laterality: N/A;  anterior repair/cysto   BLADDER SUSPENSION  03/16/2012   Procedure: TRANSVAGINAL TAPE (TVT) PROCEDURE;  Surgeon: Madelene Schanz, MD;  Location: [facility];  Service: Gynecology;  Laterality: N/A;   BREAST REDUCTION SURGERY  6/04   CHOLECYSTECTOMY  06/28/14   CYSTOSCOPY  03/16/2012   Procedure: CYSTOSCOPY;  Surgeon: Madelene Schanz, MD;  Location: [facility];  Service:  Gynecology;  Laterality: N/A;   CYSTOSCOPY  03/16/2017   Procedure: CYSTOSCOPY;  Surgeon: Renea Carrion, MD;  Location: [facility];  Service: Gynecology;;   DILITATION & CURRETTAGE/HYSTROSCOPY WITH NOVASURE ABLATION  03/16/2012   Procedure: DILATATION & CURETTAGE/HYSTEROSCOPY WITH NOVASURE ABLATION;  Surgeon:  Madelene Schanz, MD;  Location: [facility];  Service: Gynecology;  Laterality: N/A;   EXTRACORPOREAL SHOCK WAVE LITHOTRIPSY Right 07/18/2021   Procedure: EXTRACORPOREAL SHOCK WAVE LITHOTRIPSY (ESWL);  Surgeon: Lawerence Pressman, MD;  Location: [facility];  Service: Urology;  Laterality: Right;   TONSILLECTOMY AND ADENOIDECTOMY     VAGINAL HYSTERECTOMY Bilateral 03/16/2017   Procedure: HYSTERECTOMY VAGINAL Bilateral Salpingectomy ;  Surgeon: Renea Carrion, MD;  Location: [facility];  Service: Gynecology;  Laterality: Bilateral;    Medical History: Past Medical History:  Diagnosis Date   Anxiety 08/29/10   Bursitis    left foot   BV (bacterial vaginosis) 08/29/10   Complication of anesthesia 2004   vomited   Gross hematuria 07/08/2015   H/O bladder infections    H/O mumps    H/O toxoplasmosis    H/O varicella    History of kidney stones    Infections of kidney    several kidney stones, bladder infections   Kidney disease 09/03/2011   Left ureteral calculus with partial obstruction, passed 06/09/2014. CT did show perinephric stranding.    Menometrorrhagia 06/26/10   Plantar fasciitis    PMS (premenstrual syndrome) 07/10/11   Post - coital bleeding 08/29/10   Renal calculus, left    Sinus infection    UTI (lower urinary tract infection) 07/08/2015   Yeast infection     Family History: Family History  Problem Relation Age of Onset   Cancer Other        breast   Cancer Mother        cervical   Depression Mother    Drug abuse Mother    Anemia Mother        blood transfusion   Migraines Mother     Social History   Socioeconomic History   Marital status: Married    Spouse name: Not on file   Number of children: Not on file   Years of education: Not on file   Highest education level: Not on file  Occupational History   Not on file  Tobacco Use   Smoking status: Never   Smokeless tobacco: Never  Vaping Use   Vaping status: Never Used  Substance and Sexual Activity   Alcohol use:  No   Drug use: No   Sexual activity: Yes    Birth control/protection: Surgical    Comment: pt spouse had vasec.   Other Topics Concern   Not on file  Social History Narrative   Not on file   Social Drivers of Health   Financial Resource Strain: Not on file  Food Insecurity: Not on file  Transportation Needs: Not on file  Physical Activity: Not on file  Stress: Not on file  Social Connections: Not on file  Intimate Partner Violence: Not on file      Review of Systems  Constitutional:  Positive for unexpected weight change. Negative for activity change, appetite change, chills, fatigue and fever.  HENT: Negative.  Negative for congestion, ear pain, rhinorrhea, sore throat and trouble swallowing.   Eyes: Negative.   Respiratory: Negative.  Negative for cough, chest tightness, shortness of breath and wheezing.   Cardiovascular: Negative.  Negative for chest pain.  Gastrointestinal: Negative.  Negative  for abdominal pain, blood in stool, constipation, diarrhea, nausea and vomiting.  Endocrine: Negative.   Genitourinary: Negative.  Negative for difficulty urinating, dysuria, frequency, hematuria and urgency.  Musculoskeletal: Negative.  Negative for arthralgias, back pain, joint swelling, myalgias and neck pain.  Skin: Negative.  Negative for rash and wound.  Allergic/Immunologic: Negative.  Negative for immunocompromised state.  Neurological: Negative.  Negative for dizziness, seizures, numbness and headaches.  Hematological: Negative.   Psychiatric/Behavioral: Negative.  Negative for behavioral problems, self-injury and suicidal ideas. The patient is not nervous/anxious.     Vital Signs: BP 110/76   Pulse 86   Temp 98.3 F (36.8 C)   Resp 16   Ht 5\' 3"  (1.6 m)   Wt 148 lb 3.2 oz (67.2 kg)   LMP 02/23/2017 (Exact Date)   SpO2 98%   BMI 26.25 kg/m    Physical Exam Vitals reviewed.  Constitutional:      General: She is awake. She is not in acute distress.     Appearance: Normal appearance. She is well-developed, well-groomed and overweight. She is not ill-appearing or diaphoretic.  HENT:     Head: Normocephalic and atraumatic.     Right Ear: Tympanic membrane, ear canal and external ear normal.     Left Ear: Tympanic membrane, ear canal and external ear normal.     Nose: Nose normal. No congestion or rhinorrhea.     Mouth/Throat:     Lips: Pink.     Mouth: Mucous membranes are moist.     Pharynx: Oropharynx is clear. Uvula midline. No oropharyngeal exudate or posterior oropharyngeal erythema.  Eyes:     General: Lids are normal. Vision grossly intact. Gaze aligned appropriately. No scleral icterus.       Right eye: No discharge.        Left eye: No discharge.     Extraocular Movements: Extraocular movements intact.     Conjunctiva/sclera: Conjunctivae normal.     Pupils: Pupils are equal, round, and reactive to light.     Funduscopic exam:    Right eye: Red reflex present.        Left eye: Red reflex present. Neck:     Thyroid: No thyromegaly.     Vascular: No carotid bruit or JVD.     Trachea: Trachea and phonation normal. No tracheal deviation.  Cardiovascular:     Rate and Rhythm: Normal rate and regular rhythm.     Pulses: Normal pulses.     Heart sounds: Normal heart sounds, S1 normal and S2 normal. No murmur heard.    No friction rub. No gallop.  Pulmonary:     Effort: Pulmonary effort is normal. No accessory muscle usage or respiratory distress.     Breath sounds: Normal breath sounds and air entry. No stridor. No wheezing or rales.  Chest:     Chest wall: No tenderness.     Comments: Declined clinical breast exam Abdominal:     General: Bowel sounds are normal. There is no distension.     Palpations: Abdomen is soft. There is no shifting dullness, fluid wave, mass or pulsatile mass.     Tenderness: There is no abdominal tenderness. There is no guarding or rebound.  Musculoskeletal:        General: No tenderness or  deformity. Normal range of motion.     Cervical back: Normal range of motion and neck supple.     Right lower leg: No edema.     Left lower leg: No edema.  Lymphadenopathy:     Cervical: No cervical adenopathy.  Skin:    General: Skin is warm and dry.     Capillary Refill: Capillary refill takes less than 2 seconds.     Coloration: Skin is not pale.     Findings: No erythema or rash.  Neurological:     Mental Status: She is alert and oriented to person, place, and time.     Cranial Nerves: No cranial nerve deficit.     Motor: No abnormal muscle tone.     Coordination: Coordination normal.     Gait: Gait normal.     Deep Tendon Reflexes: Reflexes are normal and symmetric.  Psychiatric:        Mood and Affect: Mood normal.        Behavior: Behavior normal. Behavior is cooperative.        Thought Content: Thought content normal.        Judgment: Judgment normal.        Assessment/Plan: 1. Encounter for routine adult health examination with abnormal findings (Primary) Age-appropriate preventive screenings and vaccinations discussed, annual physical exam completed. Routine labs for health maintenance will be ordered for june. PHM updated.    2. Benign hypertension Stable, continue bisoprolol -hydrochlorothiazide  as prescribed.  - bisoprolol -hydrochlorothiazide  (ZIAC ) 2.5-6.25 MG tablet; Take 1 tablet by mouth daily.  Dispense: 90 tablet; Refill: 1  3. Mixed hyperlipidemia Continue rosuvastatin  as prescribed.  - rosuvastatin  (CRESTOR ) 5 MG tablet; Take 1 tablet (5 mg total) by mouth daily.  Dispense: 90 tablet; Refill: 1  4. Allergic rhinitis due to pollen, unspecified seasonality Continue montelukast  as prescribed.  - montelukast  (SINGULAIR ) 10 MG tablet; TAKE 1 TABLET BY MOUTH EVERYDAY AT BEDTIME  Dispense: 90 tablet; Refill: 1  5. Other insomnia Continue lunesta  as prescribed. Follow up in 3 months for additional refills of lunesta .  - eszopiclone  (LUNESTA ) 2 MG TABS  tablet; Take 1 tablet (2 mg total) by mouth at bedtime as needed for sleep. Take immediately before bedtime  Dispense: 30 tablet; Refill: 2  6. Overweight with body mass index (BMI) of 26 to 26.9 in adult Continue phentermine  as prescribed. Follow up in 12 weeks for weigh in - phentermine  (ADIPEX-P ) 37.5 MG tablet; Take 1 tablet (37.5 mg total) by mouth daily before breakfast. May take 1/2 tablet if whole not tolerated.  Dispense: 30 tablet; Refill: 2  7. Moderate major depression (HCC) Continue bupropion  as prescribed.  - buPROPion  (WELLBUTRIN  XL) 150 MG 24 hr tablet; Take 1 tablet (150 mg total) by mouth daily.  Dispense: 90 tablet; Refill: 1      General Counseling: Aspyn verbalizes understanding of the findings of todays visit and agrees with plan of treatment. I have discussed any further diagnostic evaluation that may be needed or ordered today. We also reviewed her medications today. she has been encouraged to call the office with any questions or concerns that should arise related to todays visit.    No orders of the defined types were placed in this encounter.   Meds ordered this encounter  Medications   eszopiclone  (LUNESTA ) 2 MG TABS tablet    Sig: Take 1 tablet (2 mg total) by mouth at bedtime as needed for sleep. Take immediately before bedtime    Dispense:  30 tablet    Refill:  2    For future refills   buPROPion  (WELLBUTRIN  XL) 150 MG 24 hr tablet    Sig: Take 1 tablet (150 mg total) by mouth daily.  Dispense:  90 tablet    Refill:  1   bisoprolol -hydrochlorothiazide  (ZIAC ) 2.5-6.25 MG tablet    Sig: Take 1 tablet by mouth daily.    Dispense:  90 tablet    Refill:  1   rosuvastatin  (CRESTOR ) 5 MG tablet    Sig: Take 1 tablet (5 mg total) by mouth daily.    Dispense:  90 tablet    Refill:  1   montelukast  (SINGULAIR ) 10 MG tablet    Sig: TAKE 1 TABLET BY MOUTH EVERYDAY AT BEDTIME    Dispense:  90 tablet    Refill:  1   phentermine  (ADIPEX-P ) 37.5 MG tablet     Sig: Take 1 tablet (37.5 mg total) by mouth daily before breakfast. May take 1/2 tablet if whole not tolerated.    Dispense:  30 tablet    Refill:  2    Return in about 12 weeks (around 09/02/2023) for F/U, Fiana Gladu PCP lunesta  refills .   Total time spent:30 Minutes Time spent includes review of chart, medications, test results, and follow up plan with the patient.   [place] Controlled Substance Database was reviewed by me.  This patient was seen by Laurence Pons, FNP-C in collaboration with Dr. Verneta Gone as a part of collaborative care agreement.  Maylie Ashton R. Bobbi Burow, MSN, FNP-C Internal medicine

## 2023-07-25 ENCOUNTER — Encounter: Payer: Self-pay | Admitting: Nurse Practitioner

## 2023-09-02 ENCOUNTER — Ambulatory Visit (INDEPENDENT_AMBULATORY_CARE_PROVIDER_SITE_OTHER): Admitting: Nurse Practitioner

## 2023-09-02 ENCOUNTER — Encounter: Payer: Self-pay | Admitting: Nurse Practitioner

## 2023-09-02 VITALS — BP 110/84 | HR 85 | Temp 98.1°F | Resp 16 | Ht 63.0 in | Wt 147.4 lb

## 2023-09-02 DIAGNOSIS — R5383 Other fatigue: Secondary | ICD-10-CM

## 2023-09-02 DIAGNOSIS — N951 Menopausal and female climacteric states: Secondary | ICD-10-CM

## 2023-09-02 DIAGNOSIS — R7301 Impaired fasting glucose: Secondary | ICD-10-CM

## 2023-09-02 DIAGNOSIS — I10 Essential (primary) hypertension: Secondary | ICD-10-CM

## 2023-09-02 DIAGNOSIS — G4709 Other insomnia: Secondary | ICD-10-CM

## 2023-09-02 DIAGNOSIS — E782 Mixed hyperlipidemia: Secondary | ICD-10-CM

## 2023-09-02 DIAGNOSIS — E663 Overweight: Secondary | ICD-10-CM

## 2023-09-02 DIAGNOSIS — E538 Deficiency of other specified B group vitamins: Secondary | ICD-10-CM

## 2023-09-02 DIAGNOSIS — E559 Vitamin D deficiency, unspecified: Secondary | ICD-10-CM

## 2023-09-02 DIAGNOSIS — Z6826 Body mass index (BMI) 26.0-26.9, adult: Secondary | ICD-10-CM

## 2023-09-02 MED ORDER — ESZOPICLONE 2 MG PO TABS: 2.0000 mg | ORAL_TABLET | Freq: Every evening | ORAL | 2 refills | Status: DC | PRN

## 2023-09-02 MED ORDER — DIETHYLPROPION HCL ER 75 MG PO TB24: 75.0000 mg | ORAL_TABLET | Freq: Every day | ORAL | 2 refills | Status: DC

## 2023-09-02 NOTE — Progress Notes (Unsigned)
 [facility] [address] [place], [place] [address]  Internal MEDICINE  Office Visit Note  Patient Name: Kayla Jimenez  [id number]  [id number]  Date of Service: 09/02/2023  Chief Complaint  Patient presents with  . Follow-up    HPI Kayla Jimenez presents for a follow-up visit for  Constipation -- was caused by the phentermine  and has improved since she stopped taking it a couple of weeks ago.  Anxiety Insomnia Perimenopausal symptoms Weight loss --     Current Medication: Outpatient Encounter Medications as of 09/02/2023  Medication Sig  . bisoprolol -hydrochlorothiazide  (ZIAC ) 2.5-6.25 MG tablet Take 1 tablet by mouth daily.  . buPROPion  (WELLBUTRIN  XL) 150 MG 24 hr tablet Take 1 tablet (150 mg total) by mouth daily.  . Cranberry-Vitamin C (CRANBERRY CONCENTRATE/VITAMINC) 15000-100 MG CAPS Take by mouth.  . cyanocobalamin  (VITAMIN B12) 1000 MCG/ML injection INJECT 1 ML (1,000 MCG TOTAL) INTO THE MUSCLE EVERY 30 DAYS.  . Diethylpropion  HCl CR 75 MG TB24 Take 1 tablet (75 mg total) by mouth daily before breakfast.  . fluticasone  (FLONASE ) 50 MCG/ACT nasal spray Place 2 sprays into both nostrils daily.  . montelukast  (SINGULAIR ) 10 MG tablet TAKE 1 TABLET BY MOUTH EVERYDAY AT BEDTIME  . rosuvastatin  (CRESTOR ) 5 MG tablet Take 1 tablet (5 mg total) by mouth daily.  . [DISCONTINUED] eszopiclone  (LUNESTA ) 2 MG TABS tablet Take 1 tablet (2 mg total) by mouth at bedtime as needed for sleep. Take immediately before bedtime  . [DISCONTINUED] phentermine  (ADIPEX-P ) 37.5 MG tablet Take 1 tablet (37.5 mg total) by mouth daily before breakfast. May take 1/2 tablet if whole not tolerated.  . eszopiclone  (LUNESTA ) 2 MG TABS tablet Take 1 tablet (2 mg total) by mouth at bedtime as needed for sleep. Take immediately before bedtime   No facility-administered encounter medications on file as of 09/02/2023.    Surgical History: Past Surgical History:  Procedure Laterality Date  .  ABDOMINAL HYSTERECTOMY    . ANTERIOR AND POSTERIOR REPAIR  03/16/2012   Procedure: ANTERIOR (CYSTOCELE) AND POSTERIOR REPAIR (RECTOCELE);  Surgeon: Jon CINDERELLA Rummer, MD;  Location: [facility];  Service: Gynecology;  Laterality: N/A;  anterior repair/cysto  . BLADDER SUSPENSION  03/16/2012   Procedure: TRANSVAGINAL TAPE (TVT) PROCEDURE;  Surgeon: Jon CINDERELLA Rummer, MD;  Location: [facility];  Service: Gynecology;  Laterality: N/A;  . BREAST REDUCTION SURGERY  6/04  . CHOLECYSTECTOMY  06/28/14  . CYSTOSCOPY  03/16/2012   Procedure: CYSTOSCOPY;  Surgeon: Jon CINDERELLA Rummer, MD;  Location: [facility];  Service: Gynecology;  Laterality: N/A;  . CYSTOSCOPY  03/16/2017   Procedure: CYSTOSCOPY;  Surgeon: Rummer Jon, MD;  Location: [facility];  Service: Gynecology;;  . LORRE & CURRETTAGE/HYSTROSCOPY WITH NOVASURE ABLATION  03/16/2012   Procedure: DILATATION & CURETTAGE/HYSTEROSCOPY WITH NOVASURE ABLATION;  Surgeon: Jon CINDERELLA Rummer, MD;  Location: [facility];  Service: Gynecology;  Laterality: N/A;  . EXTRACORPOREAL SHOCK WAVE LITHOTRIPSY Right 07/18/2021   Procedure: EXTRACORPOREAL SHOCK WAVE LITHOTRIPSY (ESWL);  Surgeon: Francisca Redell BROCKS, MD;  Location: [facility];  Service: Urology;  Laterality: Right;  . TONSILLECTOMY AND ADENOIDECTOMY    . VAGINAL HYSTERECTOMY Bilateral 03/16/2017   Procedure: HYSTERECTOMY VAGINAL Bilateral Salpingectomy ;  Surgeon: Rummer Jon, MD;  Location: [facility];  Service: Gynecology;  Laterality: Bilateral;    Medical History: Past Medical History:  Diagnosis Date  . Anxiety 08/29/10  . Bursitis    left foot  . BV (bacterial vaginosis) 08/29/10  . Complication of anesthesia 2004   vomited  .  Gross hematuria 07/08/2015  . H/O bladder infections   . H/O mumps   . H/O toxoplasmosis   . H/O varicella   . History of kidney stones   . Infections of kidney    several kidney stones, bladder infections  . Kidney disease 09/03/2011   Left ureteral calculus with partial obstruction,  passed 06/09/2014. CT did show perinephric stranding.   . Menometrorrhagia 06/26/10  . Plantar fasciitis   . PMS (premenstrual syndrome) 07/10/11  . Post - coital bleeding 08/29/10  . Renal calculus, left   . Sinus infection   . UTI (lower urinary tract infection) 07/08/2015  . Yeast infection     Family History: Family History  Problem Relation Age of Onset  . Cancer Other        breast  . Cancer Mother        cervical  . Depression Mother   . Drug abuse Mother   . Anemia Mother        blood transfusion  . Migraines Mother     Social History   Socioeconomic History  . Marital status: Married    Spouse name: Not on file  . Number of children: Not on file  . Years of education: Not on file  . Highest education level: Not on file  Occupational History  . Not on file  Tobacco Use  . Smoking status: Never  . Smokeless tobacco: Never  Vaping Use  . Vaping status: Never Used  Substance and Sexual Activity  . Alcohol use: No  . Drug use: No  . Sexual activity: Yes    Birth control/protection: Surgical    Comment: pt spouse had vasec.   Other Topics Concern  . Not on file  Social History Narrative  . Not on file   Social Drivers of Health   Financial Resource Strain: Not on file  Food Insecurity: Not on file  Transportation Needs: Not on file  Physical Activity: Not on file  Stress: Not on file  Social Connections: Not on file  Intimate Partner Violence: Not on file      Review of Systems  Constitutional:  Positive for appetite change and unexpected weight change. Negative for chills and fatigue.  HENT: Negative.  Negative for congestion, rhinorrhea, sneezing and sore throat.   Eyes:  Negative for redness.  Respiratory: Negative.  Negative for cough, chest tightness, shortness of breath and wheezing.   Cardiovascular: Negative.  Negative for chest pain and palpitations.  Gastrointestinal:  Negative for abdominal pain, constipation, diarrhea, nausea and  vomiting.  Genitourinary:  Negative for dysuria and frequency.  Musculoskeletal:  Negative for arthralgias, back pain, joint swelling and neck pain.  Skin:  Negative for rash.  Neurological: Negative.  Negative for tremors and numbness.  Hematological:  Negative for adenopathy. Does not bruise/bleed easily.  Psychiatric/Behavioral:  Positive for behavioral problems (Depression -- takes bupropion ) and sleep disturbance (takes lunesta ). Negative for decreased concentration, self-injury and suicidal ideas. The patient is nervous/anxious.     Vital Signs: BP 110/84   Pulse 85   Temp 98.1 F (36.7 C)   Resp 16   Ht 5' 3 (1.6 m)   Wt 147 lb 6.4 oz (66.9 kg)   LMP 02/23/2017 (Exact Date)   SpO2 99%   BMI 26.11 kg/m    Physical Exam Vitals reviewed.  Constitutional:      General: She is not in acute distress.    Appearance: Normal appearance. She is not ill-appearing.  HENT:  Head: Normocephalic and atraumatic.  Eyes:     Pupils: Pupils are equal, round, and reactive to light.  Cardiovascular:     Rate and Rhythm: Normal rate and regular rhythm.  Pulmonary:     Effort: Pulmonary effort is normal. No respiratory distress.  Neurological:     Mental Status: She is alert and oriented to person, place, and time.  Psychiatric:        Mood and Affect: Mood normal.        Behavior: Behavior normal.        Assessment/Plan:   General Counseling: Kayla Jimenez verbalizes understanding of the findings of todays visit and agrees with plan of treatment. I have discussed any further diagnostic evaluation that may be needed or ordered today. We also reviewed her medications today. she has been encouraged to call the office with any questions or concerns that should arise related to todays visit.    No orders of the defined types were placed in this encounter.   Meds ordered this encounter  Medications  . Diethylpropion  HCl CR 75 MG TB24    Sig: Take 1 tablet (75 mg total) by mouth  daily before breakfast.    Dispense:  30 tablet    Refill:  2    Fill new script today, patient will bring goodrx coupon, do no run through insurance.  . eszopiclone  (LUNESTA ) 2 MG TABS tablet    Sig: Take 1 tablet (2 mg total) by mouth at bedtime as needed for sleep. Take immediately before bedtime    Dispense:  30 tablet    Refill:  2    For future refills    Return in about 12 weeks (around 11/25/2023) for F/U, eval new med, Pau Banh PCP, Weight loss.   Total time spent:*** Minutes Time spent includes review of chart, medications, test results, and follow up plan with the patient.   [place] Controlled Substance Database was reviewed by me.  This patient was seen by Mardy Maxin, FNP-C in collaboration with Dr. Sigrid Bathe as a part of collaborative care agreement.   Breanah Faddis R. Maxin, MSN, FNP-C Internal medicine

## 2023-09-14 ENCOUNTER — Other Ambulatory Visit: Payer: Self-pay | Admitting: Chiropractor

## 2023-09-14 ENCOUNTER — Ambulatory Visit
Admission: RE | Admit: 2023-09-14 | Discharge: 2023-09-14 | Disposition: A | Discharge status: Home / Self Care | Source: Ambulatory Visit | Attending: Chiropractor | Admitting: Chiropractor

## 2023-09-14 DIAGNOSIS — M5431 Sciatica, right side: Secondary | ICD-10-CM | POA: Insufficient documentation

## 2023-10-11 ENCOUNTER — Encounter: Payer: Self-pay | Admitting: Nurse Practitioner

## 2023-10-11 DIAGNOSIS — N951 Menopausal and female climacteric states: Secondary | ICD-10-CM | POA: Insufficient documentation

## 2023-10-29 LAB — IRON,TIBC AND FERRITIN PANEL
Ferritin: 180 ng/mL — ABNORMAL HIGH (ref 15–150)
Iron Saturation: 32 % (ref 15–55)
Iron: 77 ug/dL (ref 27–159)
Total Iron Binding Capacity: 241 ug/dL — ABNORMAL LOW (ref 250–450)
UIBC: 164 ug/dL (ref 131–425)

## 2023-10-29 LAB — CBC WITH DIFFERENTIAL/PLATELET
Basophils Absolute: 0.1 x10E3/uL (ref 0.0–0.2)
Basos: 1 %
EOS (ABSOLUTE): 0.1 x10E3/uL (ref 0.0–0.4)
Eos: 1 %
Hematocrit: 41.9 % (ref 34.0–46.6)
Hemoglobin: 13.9 g/dL (ref 11.1–15.9)
Immature Grans (Abs): 0 x10E3/uL (ref 0.0–0.1)
Immature Granulocytes: 0 %
Lymphocytes Absolute: 2.9 x10E3/uL (ref 0.7–3.1)
Lymphs: 34 %
MCH: 32.5 pg (ref 26.6–33.0)
MCHC: 33.2 g/dL (ref 31.5–35.7)
MCV: 98 fL — ABNORMAL HIGH (ref 79–97)
Monocytes Absolute: 0.5 x10E3/uL (ref 0.1–0.9)
Monocytes: 6 %
Neutrophils Absolute: 5.1 x10E3/uL (ref 1.4–7.0)
Neutrophils: 58 %
Platelets: 322 x10E3/uL (ref 150–450)
RBC: 4.28 x10E6/uL (ref 3.77–5.28)
RDW: 11.4 % — ABNORMAL LOW (ref 11.7–15.4)
WBC: 8.7 x10E3/uL (ref 3.4–10.8)

## 2023-10-29 LAB — LIPID PANEL
Chol/HDL Ratio: 2.7 ratio (ref 0.0–4.4)
Cholesterol, Total: 128 mg/dL (ref 100–199)
HDL: 47 mg/dL (ref >39)
LDL Chol Calc (NIH): 61 mg/dL (ref 0–99)
Triglycerides: 109 mg/dL (ref 0–149)
VLDL Cholesterol Cal: 20 mg/dL (ref 5–40)

## 2023-10-29 LAB — CMP14+EGFR
ALT: 18 IU/L (ref 0–32)
AST: 12 IU/L (ref 0–40)
Albumin: 4.2 g/dL (ref 3.9–4.9)
Alkaline Phosphatase: 63 IU/L (ref 44–121)
BUN/Creatinine Ratio: 18 (ref 9–23)
BUN: 13 mg/dL (ref 6–24)
Bilirubin Total: 0.5 mg/dL (ref 0.0–1.2)
CO2: 25 mmol/L (ref 20–29)
Calcium: 9.4 mg/dL (ref 8.7–10.2)
Chloride: 102 mmol/L (ref 96–106)
Creatinine, Ser: 0.73 mg/dL (ref 0.57–1.00)
Globulin, Total: 2.3 g/dL (ref 1.5–4.5)
Glucose: 89 mg/dL (ref 70–99)
Potassium: 4.1 mmol/L (ref 3.5–5.2)
Sodium: 141 mmol/L (ref 134–144)
Total Protein: 6.5 g/dL (ref 6.0–8.5)
eGFR: 101 mL/min/1.73 (ref >59)

## 2023-10-29 LAB — B12 AND FOLATE PANEL
Folate: 12.9 ng/mL (ref >3.0)
Vitamin B-12: 219 pg/mL — ABNORMAL LOW (ref 232–1245)

## 2023-10-29 LAB — ESTRADIOL: Estradiol: 20.8 pg/mL

## 2023-10-29 LAB — TSH+FREE T4
Free T4: 1.29 ng/dL (ref 0.82–1.77)
TSH: 1.22 u[IU]/mL (ref 0.450–4.500)

## 2023-10-29 LAB — FSH/LH
FSH: 113 m[IU]/mL
LH: 56.1 m[IU]/mL

## 2023-10-29 LAB — VITAMIN D 25 HYDROXY (VIT D DEFICIENCY, FRACTURES): Vit D, 25-Hydroxy: 39.4 ng/mL (ref 30.0–100.0)

## 2023-11-02 ENCOUNTER — Encounter: Payer: Self-pay | Admitting: Nurse Practitioner

## 2023-11-13 IMAGING — CR DG ABDOMEN 1V
1 series · 2 of 2 positions shown · non-contrast
Comparison: July 18, 2021

CLINICAL DATA: Status post ESWL for right ureteral stone on July 18, 2021

EXAM:
ABDOMEN - 1 VIEW

[Series 1: dg abd 1 view · 0.14mm/px · 2 of 2 slices shown]
[im 1/2]
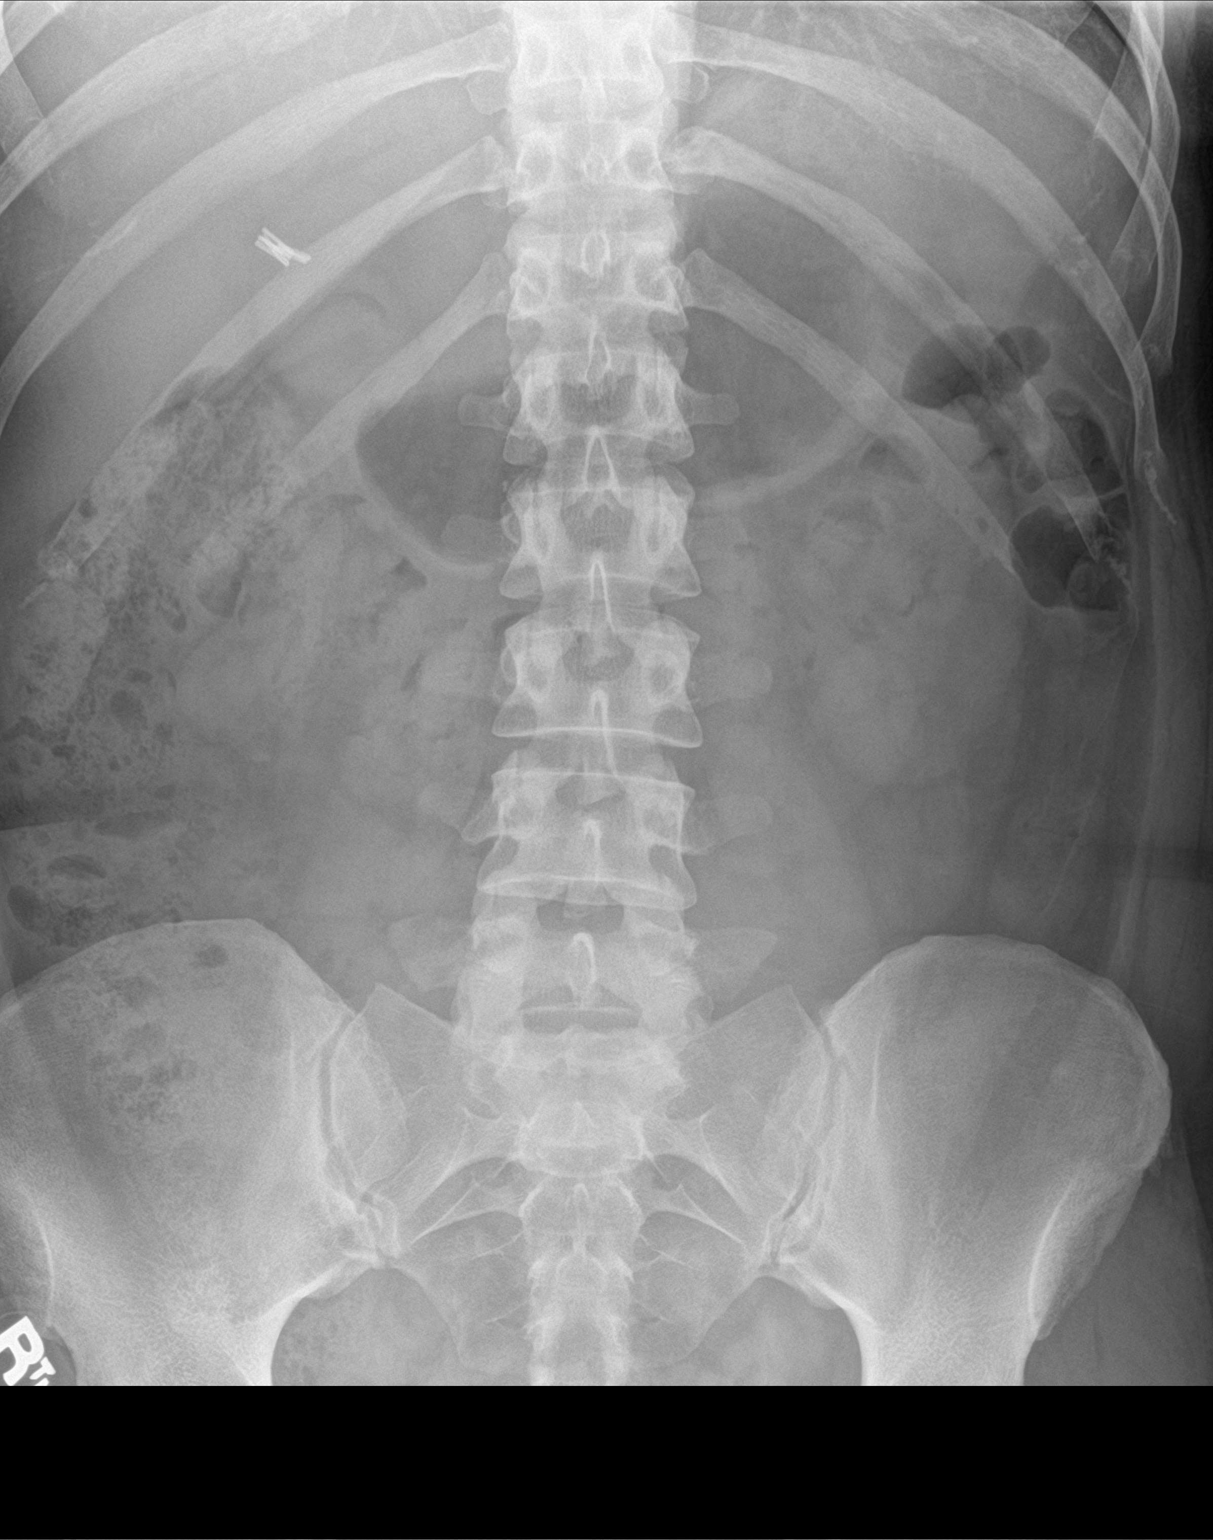
[im 2/2]
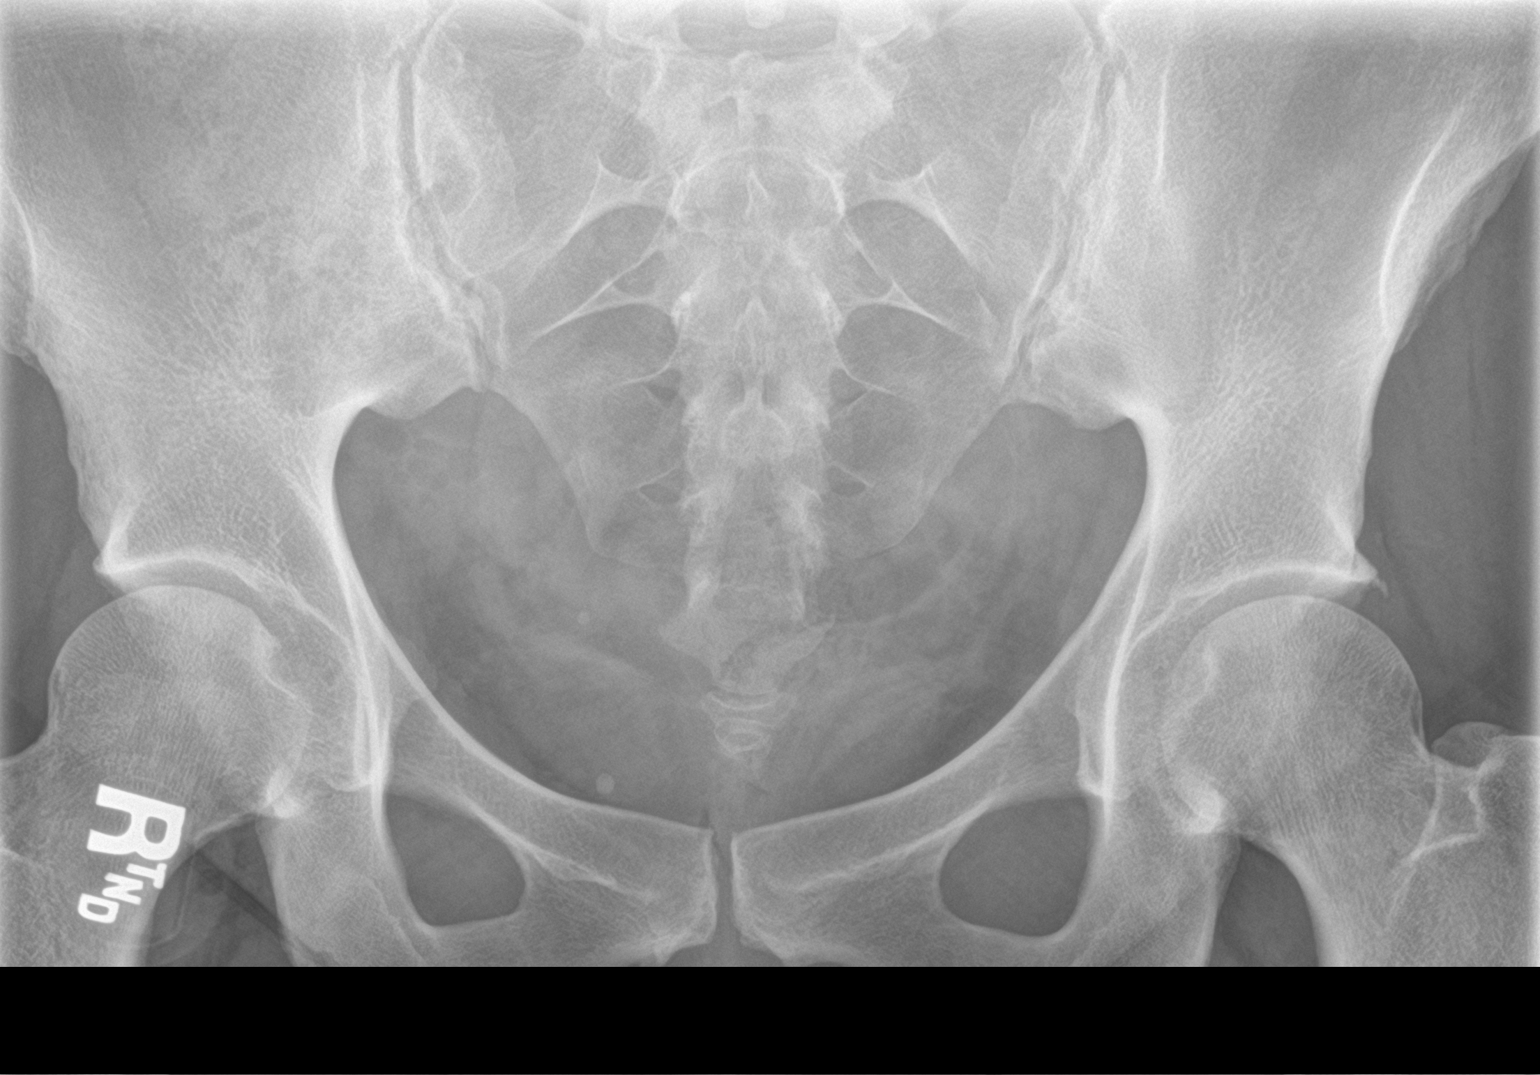

[2 of 2 positions shown; findings below may reference images not displayed]

FINDINGS: The bowel gas pattern is normal. Previously questioned 5 mm proximal
right ureteral calculus is identified unchanged. Previously noted
calcific densities in the right pelvis are unchanged.
IMPRESSION: Previously questioned 5 mm proximal right ureteral calculus is
identified unchanged. Previously noted calcific densities in the
right pelvis are unchanged.

## 2023-11-19 ENCOUNTER — Encounter: Payer: Self-pay | Admitting: Nurse Practitioner

## 2023-11-19 ENCOUNTER — Ambulatory Visit (INDEPENDENT_AMBULATORY_CARE_PROVIDER_SITE_OTHER): Admitting: Nurse Practitioner

## 2023-11-19 VITALS — BP 108/74 | HR 65 | Temp 98.3°F | Resp 16 | Ht 63.0 in | Wt 147.2 lb

## 2023-11-19 DIAGNOSIS — E538 Deficiency of other specified B group vitamins: Secondary | ICD-10-CM

## 2023-11-19 DIAGNOSIS — I10 Essential (primary) hypertension: Secondary | ICD-10-CM

## 2023-11-19 DIAGNOSIS — J301 Allergic rhinitis due to pollen: Secondary | ICD-10-CM

## 2023-11-19 DIAGNOSIS — F321 Major depressive disorder, single episode, moderate: Secondary | ICD-10-CM

## 2023-11-19 DIAGNOSIS — E663 Overweight: Secondary | ICD-10-CM

## 2023-11-19 DIAGNOSIS — G4709 Other insomnia: Secondary | ICD-10-CM

## 2023-11-19 DIAGNOSIS — Z6826 Body mass index (BMI) 26.0-26.9, adult: Secondary | ICD-10-CM

## 2023-11-19 DIAGNOSIS — E782 Mixed hyperlipidemia: Secondary | ICD-10-CM | POA: Diagnosis not present

## 2023-11-19 DIAGNOSIS — N951 Menopausal and female climacteric states: Secondary | ICD-10-CM

## 2023-11-19 MED ORDER — DIETHYLPROPION HCL ER 75 MG PO TB24: 75.0000 mg | ORAL_TABLET | Freq: Every day | ORAL | 2 refills | Status: DC

## 2023-11-19 MED ORDER — MONTELUKAST SODIUM 10 MG PO TABS: ORAL_TABLET | ORAL | 1 refills | Status: DC

## 2023-11-19 MED ORDER — ESZOPICLONE 2 MG PO TABS: 2.0000 mg | ORAL_TABLET | Freq: Every evening | ORAL | 2 refills | Status: DC | PRN

## 2023-11-19 MED ORDER — CYANOCOBALAMIN 1000 MCG/ML IJ SOLN: 1000.0000 ug | INTRAMUSCULAR | 3 refills | Status: AC

## 2023-11-19 MED ORDER — BUPROPION HCL ER (XL) 150 MG PO TB24: 150.0000 mg | ORAL_TABLET | Freq: Every day | ORAL | 1 refills | Status: DC

## 2023-11-19 MED ORDER — ROSUVASTATIN CALCIUM 5 MG PO TABS: 5.0000 mg | ORAL_TABLET | Freq: Every day | ORAL | 1 refills | Status: DC

## 2023-11-19 MED ORDER — BISOPROLOL-HYDROCHLOROTHIAZIDE 2.5-6.25 MG PO TABS: 1.0000 | ORAL_TABLET | Freq: Every day | ORAL | 1 refills | Status: DC

## 2023-11-19 MED ORDER — ESTRADIOL 0.0375 MG/24HR TD PTWK: 0.0375 mg | MEDICATED_PATCH | TRANSDERMAL | 12 refills | Status: DC

## 2023-11-19 MED ORDER — SYRINGE 25G X 1" 3 ML MISC: 3 refills | Status: AC

## 2023-11-19 NOTE — Progress Notes (Signed)
 [facility] [address] [place], [place] [address]  Internal MEDICINE  Office Visit Note  Patient Name: Kayla Jimenez  [id number]  [id number]  Date of Service: 11/19/2023  Chief Complaint  Patient presents with   Follow-up    HPI Kayla Jimenez presents for a follow-up visit for hypertension, vasomotor symptoms, high cholesterol, refills and labs.  Low B12  Postmenopusal state with vasomotor symptoms -- wants to try HRT Weight loss -- taking diethylpropion , no weight loss since her last visit and no weight gain, wants to continue the medication.  Hypertension -- controlled with bisoprolol -hydrochlorothiazide   High cholesterol -- taking rosuvastatin , no issues Insomnia -- taking lunesta  which works well, due for refills.    Current Medication: Outpatient Encounter Medications as of 11/19/2023  Medication Sig   estradiol  (CLIMARA  - DOSED IN MG/24 HR) 0.0375 mg/24hr patch Place 1 patch (0.0375 mg total) onto the skin once a week.   Syringe/Needle, Disp, (SYRINGE 3CC/25GX1) 25G X 1 3 ML MISC Use 1 syringe and needle to administer B12 injection, change the needle to a new needle after drawing up the medication to administer it.   bisoprolol -hydrochlorothiazide  (ZIAC ) 2.5-6.25 MG tablet Take 1 tablet by mouth daily.   buPROPion  (WELLBUTRIN  XL) 150 MG 24 hr tablet Take 1 tablet (150 mg total) by mouth daily.   Cranberry-Vitamin C (CRANBERRY CONCENTRATE/VITAMINC) 15000-100 MG CAPS Take by mouth.   cyanocobalamin  (VITAMIN B12) 1000 MCG/ML injection Inject 1 mL (1,000 mcg total) into the skin every 30 (thirty) days.   Diethylpropion  HCl CR 75 MG TB24 Take 1 tablet (75 mg total) by mouth daily before breakfast.   eszopiclone  (LUNESTA ) 2 MG TABS tablet Take 1 tablet (2 mg total) by mouth at bedtime as needed for sleep. Take immediately before bedtime   fluticasone  (FLONASE ) 50 MCG/ACT nasal spray Place 2 sprays into both nostrils daily.   montelukast  (SINGULAIR ) 10 MG tablet TAKE 1  TABLET BY MOUTH EVERYDAY AT BEDTIME   rosuvastatin  (CRESTOR ) 5 MG tablet Take 1 tablet (5 mg total) by mouth daily.   [DISCONTINUED] bisoprolol -hydrochlorothiazide  (ZIAC ) 2.5-6.25 MG tablet Take 1 tablet by mouth daily.   [DISCONTINUED] buPROPion  (WELLBUTRIN  XL) 150 MG 24 hr tablet Take 1 tablet (150 mg total) by mouth daily.   [DISCONTINUED] cyanocobalamin  (VITAMIN B12) 1000 MCG/ML injection INJECT 1 ML (1,000 MCG TOTAL) INTO THE MUSCLE EVERY 30 DAYS.   [DISCONTINUED] Diethylpropion  HCl CR 75 MG TB24 Take 1 tablet (75 mg total) by mouth daily before breakfast.   [DISCONTINUED] eszopiclone  (LUNESTA ) 2 MG TABS tablet Take 1 tablet (2 mg total) by mouth at bedtime as needed for sleep. Take immediately before bedtime   [DISCONTINUED] montelukast  (SINGULAIR ) 10 MG tablet TAKE 1 TABLET BY MOUTH EVERYDAY AT BEDTIME   [DISCONTINUED] rosuvastatin  (CRESTOR ) 5 MG tablet Take 1 tablet (5 mg total) by mouth daily.   No facility-administered encounter medications on file as of 11/19/2023.    Surgical History: Past Surgical History:  Procedure Laterality Date   ABDOMINAL HYSTERECTOMY     ANTERIOR AND POSTERIOR REPAIR  03/16/2012   Procedure: ANTERIOR (CYSTOCELE) AND POSTERIOR REPAIR (RECTOCELE);  Surgeon: Jon CINDERELLA Rummer, MD;  Location: [facility];  Service: Gynecology;  Laterality: N/A;  anterior repair/cysto   BLADDER SUSPENSION  03/16/2012   Procedure: TRANSVAGINAL TAPE (TVT) PROCEDURE;  Surgeon: Jon CINDERELLA Rummer, MD;  Location: [facility];  Service: Gynecology;  Laterality: N/A;   BREAST REDUCTION SURGERY  6/04   CHOLECYSTECTOMY  06/28/14   CYSTOSCOPY  03/16/2012   Procedure:  CYSTOSCOPY;  Surgeon: Jon CINDERELLA Rummer, MD;  Location: [facility];  Service: Gynecology;  Laterality: N/A;   CYSTOSCOPY  03/16/2017   Procedure: CYSTOSCOPY;  Surgeon: Rummer Jon, MD;  Location: [facility];  Service: Gynecology;;   DILITATION & CURRETTAGE/HYSTROSCOPY WITH NOVASURE ABLATION  03/16/2012   Procedure: DILATATION &  CURETTAGE/HYSTEROSCOPY WITH NOVASURE ABLATION;  Surgeon: Jon CINDERELLA Rummer, MD;  Location: [facility];  Service: Gynecology;  Laterality: N/A;   EXTRACORPOREAL SHOCK WAVE LITHOTRIPSY Right 07/18/2021   Procedure: EXTRACORPOREAL SHOCK WAVE LITHOTRIPSY (ESWL);  Surgeon: Francisca Redell BROCKS, MD;  Location: [facility];  Service: Urology;  Laterality: Right;   TONSILLECTOMY AND ADENOIDECTOMY     VAGINAL HYSTERECTOMY Bilateral 03/16/2017   Procedure: HYSTERECTOMY VAGINAL Bilateral Salpingectomy ;  Surgeon: Rummer Jon, MD;  Location: [facility];  Service: Gynecology;  Laterality: Bilateral;    Medical History: Past Medical History:  Diagnosis Date   Anxiety 08/29/10   Bursitis    left foot   BV (bacterial vaginosis) 08/29/10   Complication of anesthesia 2004   vomited   Gross hematuria 07/08/2015   H/O bladder infections    H/O mumps    H/O toxoplasmosis    H/O varicella    History of kidney stones    Infections of kidney    several kidney stones, bladder infections   Kidney disease 09/03/2011   Left ureteral calculus with partial obstruction, passed 06/09/2014. CT did show perinephric stranding.    Menometrorrhagia 06/26/10   Plantar fasciitis    PMS (premenstrual syndrome) 07/10/11   Post - coital bleeding 08/29/10   Renal calculus, left    Sinus infection    UTI (lower urinary tract infection) 07/08/2015   Yeast infection     Family History: Family History  Problem Relation Age of Onset   Cancer Other        breast   Cancer Mother        cervical   Depression Mother    Drug abuse Mother    Anemia Mother        blood transfusion   Migraines Mother     Social History   Socioeconomic History   Marital status: Married    Spouse name: Not on file   Number of children: Not on file   Years of education: Not on file   Highest education level: Not on file  Occupational History   Not on file  Tobacco Use   Smoking status: Never   Smokeless tobacco: Never  Vaping Use   Vaping status:  Never Used  Substance and Sexual Activity   Alcohol use: No   Drug use: No   Sexual activity: Yes    Birth control/protection: Surgical    Comment: pt spouse had vasec.   Other Topics Concern   Not on file  Social History Narrative   Not on file   Social Drivers of Health   Financial Resource Strain: Not on file  Food Insecurity: Not on file  Transportation Needs: Not on file  Physical Activity: Not on file  Stress: Not on file  Social Connections: Not on file  Intimate Partner Violence: Not on file      Review of Systems  Constitutional:  Positive for appetite change and unexpected weight change. Negative for chills and fatigue.  HENT: Negative.  Negative for congestion, rhinorrhea, sneezing and sore throat.   Eyes:  Negative for redness.  Respiratory: Negative.  Negative for cough, chest tightness, shortness of breath and wheezing.   Cardiovascular: Negative.  Negative for chest pain and palpitations.  Gastrointestinal:  Negative for abdominal pain, constipation, diarrhea, nausea and vomiting.  Genitourinary:  Negative for dysuria and frequency.  Musculoskeletal:  Negative for arthralgias, back pain, joint swelling and neck pain.  Skin:  Negative for rash.  Neurological: Negative.  Negative for tremors and numbness.  Hematological:  Negative for adenopathy. Does not bruise/bleed easily.  Psychiatric/Behavioral:  Positive for behavioral problems (Depression -- takes bupropion ) and sleep disturbance (takes lunesta ). Negative for decreased concentration, self-injury and suicidal ideas. The patient is nervous/anxious.     Vital Signs: BP 108/74   Pulse 65   Temp 98.3 F (36.8 C)   Resp 16   Ht 5' 3 (1.6 m)   Wt 147 lb 3.2 oz (66.8 kg)   LMP 02/23/2017 (Exact Date)   SpO2 98%   BMI 26.08 kg/m    Physical Exam Vitals reviewed.  Constitutional:      General: She is not in acute distress.    Appearance: Normal appearance. She is not ill-appearing.  HENT:      Head: Normocephalic and atraumatic.  Eyes:     Pupils: Pupils are equal, round, and reactive to light.  Cardiovascular:     Rate and Rhythm: Normal rate and regular rhythm.  Pulmonary:     Effort: Pulmonary effort is normal. No respiratory distress.  Neurological:     Mental Status: She is alert and oriented to person, place, and time.  Psychiatric:        Mood and Affect: Mood normal.        Behavior: Behavior normal.        Assessment/Plan: 1. Benign hypertension (Primary) Stable, continue bisoprolol -hydrochlorothiazide  as prescribed.  - bisoprolol -hydrochlorothiazide  (ZIAC ) 2.5-6.25 MG tablet; Take 1 tablet by mouth daily.  Dispense: 90 tablet; Refill: 1  2. Mixed hyperlipidemia Continue rosuvastatin  as prescribed  - rosuvastatin  (CRESTOR ) 5 MG tablet; Take 1 tablet (5 mg total) by mouth daily.  Dispense: 90 tablet; Refill: 1  3. Vasomotor symptoms due to menopause Start climarta patch as prescribed.  - estradiol  (CLIMARA  - DOSED IN MG/24 HR) 0.0375 mg/24hr patch; Place 1 patch (0.0375 mg total) onto the skin once a week.  Dispense: 4 patch; Refill: 12  4. Other insomnia Continue lunesta  as prescribed.  - eszopiclone  (LUNESTA ) 2 MG TABS tablet; Take 1 tablet (2 mg total) by mouth at bedtime as needed for sleep. Take immediately before bedtime  Dispense: 30 tablet; Refill: 2  5. B12 deficiency Restart B12 injections at home. Repeat B12 level in 6 months  - cyanocobalamin  (VITAMIN B12) 1000 MCG/ML injection; Inject 1 mL (1,000 mcg total) into the skin every 30 (thirty) days.  Dispense: 3 mL; Refill: 3 - Syringe/Needle, Disp, (SYRINGE 3CC/25GX1) 25G X 1 3 ML MISC; Use 1 syringe and needle to administer B12 injection, change the needle to a new needle after drawing up the medication to administer it.  Dispense: 6 each; Refill: 3  6. Allergic rhinitis due to pollen, unspecified seasonality Continue montelukast  as prescribed  - montelukast  (SINGULAIR ) 10 MG tablet; TAKE 1  TABLET BY MOUTH EVERYDAY AT BEDTIME  Dispense: 90 tablet; Refill: 1  7. Overweight with body mass index (BMI) of 26 to 26.9 in adult Continue diethylpropion  as prescribed, follow up in 3 months  - Diethylpropion  HCl CR 75 MG TB24; Take 1 tablet (75 mg total) by mouth daily before breakfast.  Dispense: 30 tablet; Refill: 2  8. Moderate major depression (HCC) Continue bupropion  as prescribed  - buPROPion  (  WELLBUTRIN  XL) 150 MG 24 hr tablet; Take 1 tablet (150 mg total) by mouth daily.  Dispense: 90 tablet; Refill: 1   General Counseling: Kayla Jimenez verbalizes understanding of the findings of todays visit and agrees with plan of treatment. I have discussed any further diagnostic evaluation that may be needed or ordered today. We also reviewed her medications today. she has been encouraged to call the office with any questions or concerns that should arise related to todays visit.    No orders of the defined types were placed in this encounter.   Meds ordered this encounter  Medications   estradiol  (CLIMARA  - DOSED IN MG/24 HR) 0.0375 mg/24hr patch    Sig: Place 1 patch (0.0375 mg total) onto the skin once a week.    Dispense:  4 patch    Refill:  12    Fill new script today   eszopiclone  (LUNESTA ) 2 MG TABS tablet    Sig: Take 1 tablet (2 mg total) by mouth at bedtime as needed for sleep. Take immediately before bedtime    Dispense:  30 tablet    Refill:  2    For future refills   Diethylpropion  HCl CR 75 MG TB24    Sig: Take 1 tablet (75 mg total) by mouth daily before breakfast.    Dispense:  30 tablet    Refill:  2    Fill new script today, patient will bring goodrx coupon, do no run through insurance.   buPROPion  (WELLBUTRIN  XL) 150 MG 24 hr tablet    Sig: Take 1 tablet (150 mg total) by mouth daily.    Dispense:  90 tablet    Refill:  1   bisoprolol -hydrochlorothiazide  (ZIAC ) 2.5-6.25 MG tablet    Sig: Take 1 tablet by mouth daily.    Dispense:  90 tablet    Refill:  1    montelukast  (SINGULAIR ) 10 MG tablet    Sig: TAKE 1 TABLET BY MOUTH EVERYDAY AT BEDTIME    Dispense:  90 tablet    Refill:  1   rosuvastatin  (CRESTOR ) 5 MG tablet    Sig: Take 1 tablet (5 mg total) by mouth daily.    Dispense:  90 tablet    Refill:  1   cyanocobalamin  (VITAMIN B12) 1000 MCG/ML injection    Sig: Inject 1 mL (1,000 mcg total) into the skin every 30 (thirty) days.    Dispense:  3 mL    Refill:  3    Fill new script today with syringes and needles   Syringe/Needle, Disp, (SYRINGE 3CC/25GX1) 25G X 1 3 ML MISC    Sig: Use 1 syringe and needle to administer B12 injection, change the needle to a new needle after drawing up the medication to administer it.    Dispense:  6 each    Refill:  3    Fill new script today    Return in about 3 months (around 02/10/2024) for F/U, eval new med, Levan Aloia PCP sleep med refills and weight loss. .   Total time spent:30 Minutes Time spent includes review of chart, medications, test results, and follow up plan with the patient.   [place] Controlled Substance Database was reviewed by me.  This patient was seen by Mardy Maxin, FNP-C in collaboration with Dr. Sigrid Bathe as a part of collaborative care agreement.   Nasire Reali R. Maxin, MSN, FNP-C Internal medicine

## 2024-01-05 ENCOUNTER — Encounter: Payer: Self-pay | Admitting: Nurse Practitioner

## 2024-01-05 DIAGNOSIS — I10 Essential (primary) hypertension: Secondary | ICD-10-CM | POA: Insufficient documentation

## 2024-02-08 ENCOUNTER — Ambulatory Visit: Admitting: Physician Assistant

## 2024-02-08 ENCOUNTER — Encounter: Payer: Self-pay | Admitting: Physician Assistant

## 2024-02-08 VITALS — BP 108/70 | HR 78 | Temp 98.3°F | Resp 16 | Ht 63.0 in | Wt 157.0 lb

## 2024-02-08 DIAGNOSIS — E663 Overweight: Secondary | ICD-10-CM | POA: Diagnosis not present

## 2024-02-08 DIAGNOSIS — Z6827 Body mass index (BMI) 27.0-27.9, adult: Secondary | ICD-10-CM

## 2024-02-08 DIAGNOSIS — N390 Urinary tract infection, site not specified: Secondary | ICD-10-CM | POA: Diagnosis not present

## 2024-02-08 DIAGNOSIS — G4709 Other insomnia: Secondary | ICD-10-CM | POA: Diagnosis not present

## 2024-02-08 DIAGNOSIS — F321 Major depressive disorder, single episode, moderate: Secondary | ICD-10-CM

## 2024-02-08 DIAGNOSIS — R300 Dysuria: Secondary | ICD-10-CM

## 2024-02-08 MED ORDER — CIPROFLOXACIN HCL 500 MG PO TABS: 500.0000 mg | ORAL_TABLET | Freq: Two times a day (BID) | ORAL | 0 refills | Status: AC

## 2024-02-08 MED ORDER — ESZOPICLONE 2 MG PO TABS: 2.0000 mg | ORAL_TABLET | Freq: Every evening | ORAL | 2 refills | Status: DC | PRN

## 2024-02-08 NOTE — Progress Notes (Unsigned)
 [facility] [address] [place], [place] [address]  Internal MEDICINE  Office Visit Note  Patient Name: Kayla Jimenez  [id number]  [id number]  Date of Service: 02/08/2024  Chief Complaint  Patient presents with   Follow-up    Weight loss   Urinary Tract Infection   Medication Refill    HPI Pt is here for routine follow up -UTI symptoms since last week, burning and low back pain, right lower pelvic discomfort -Drinking plenty of water  -hx of UTIs and kidney stones. 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 Will double check on this -taking lunesta  to help sleep and will need refills.  Current Medication: Outpatient Encounter Medications as of 02/08/2024  Medication Sig   bisoprolol -hydrochlorothiazide  (ZIAC ) 2.5-6.25 MG tablet Take 1 tablet by mouth daily.   buPROPion  (WELLBUTRIN  XL) 150 MG 24 hr tablet Take 1 tablet (150 mg total) by mouth daily.   ciprofloxacin  (CIPRO ) 500 MG tablet Take 1 tablet (500 mg total) by mouth 2 (two) times daily for 7 days.   Cranberry-Vitamin C (CRANBERRY CONCENTRATE/VITAMINC) 15000-100 MG CAPS Take by mouth.   cyanocobalamin  (VITAMIN B12) 1000 MCG/ML injection Inject 1 mL (1,000 mcg total) into the skin every 30 (thirty) days.   estradiol  (CLIMARA  - DOSED IN MG/24 HR) 0.0375 mg/24hr patch Place 1 patch (0.0375 mg total) onto the skin once a week.   fluticasone  (FLONASE ) 50 MCG/ACT nasal spray Place 2 sprays into both nostrils daily.   montelukast  (SINGULAIR ) 10 MG tablet TAKE 1 TABLET BY MOUTH EVERYDAY AT BEDTIME   rosuvastatin  (CRESTOR ) 5 MG tablet Take 1 tablet (5 mg total) by mouth daily.   Syringe/Needle, Disp, (SYRINGE 3CC/25GX1) 25G X 1 3 ML MISC Use 1 syringe and needle to administer B12  injection, change the needle to a new needle after drawing up the medication to administer it.   [DISCONTINUED] Diethylpropion  HCl CR 75 MG TB24 Take 1 tablet (75 mg total) by mouth daily before breakfast.   [DISCONTINUED] eszopiclone  (LUNESTA ) 2 MG TABS tablet Take 1 tablet (2 mg total) by mouth at bedtime as needed for sleep. Take immediately before bedtime   [START ON 02/16/2024] eszopiclone  (LUNESTA ) 2 MG TABS tablet Take 1 tablet (2 mg total) by mouth at bedtime as needed for sleep. Take immediately before bedtime   No facility-administered encounter medications on file as of 02/08/2024.    Surgical History: Past Surgical History:  Procedure Laterality Date   ABDOMINAL HYSTERECTOMY     ANTERIOR AND POSTERIOR REPAIR  03/16/2012   Procedure: ANTERIOR (CYSTOCELE) AND POSTERIOR REPAIR (RECTOCELE);  Surgeon: Jon CINDERELLA Rummer, MD;  Location: [facility];  Service: Gynecology;  Laterality: N/A;  anterior repair/cysto   BLADDER SUSPENSION  03/16/2012   Procedure: TRANSVAGINAL TAPE (TVT) PROCEDURE;  Surgeon: Jon CINDERELLA Rummer, MD;  Location: [facility];  Service: Gynecology;  Laterality: N/A;   BREAST REDUCTION SURGERY  6/04   CHOLECYSTECTOMY  06/28/14   CYSTOSCOPY  03/16/2012   Procedure: CYSTOSCOPY;  Surgeon: Jon CINDERELLA Rummer, MD;  Location: [facility];  Service: Gynecology;  Laterality: N/A;   CYSTOSCOPY  03/16/2017   Procedure: CYSTOSCOPY;  Surgeon: Rummer Jon, MD;  Location: [facility];  Service: Gynecology;;   DILITATION & CURRETTAGE/HYSTROSCOPY WITH NOVASURE ABLATION  03/16/2012   Procedure: DILATATION & CURETTAGE/HYSTEROSCOPY WITH  NOVASURE ABLATION;  Surgeon: Jon CINDERELLA Rummer, MD;  Location: [facility];  Service: Gynecology;  Laterality: N/A;   EXTRACORPOREAL SHOCK WAVE LITHOTRIPSY Right 07/18/2021   Procedure: EXTRACORPOREAL SHOCK WAVE LITHOTRIPSY (ESWL);  Surgeon: Francisca Redell BROCKS, MD;  Location: [facility];  Service: Urology;  Laterality: Right;   TONSILLECTOMY AND ADENOIDECTOMY     VAGINAL HYSTERECTOMY  Bilateral 03/16/2017   Procedure: HYSTERECTOMY VAGINAL Bilateral Salpingectomy ;  Surgeon: Rummer Jon, MD;  Location: [facility];  Service: Gynecology;  Laterality: Bilateral;    Medical History: Past Medical History:  Diagnosis Date   Anxiety 08/29/10   Bursitis    left foot   BV (bacterial vaginosis) 08/29/10   Complication of anesthesia 2004   vomited   Gross hematuria 07/08/2015   H/O bladder infections    H/O mumps    H/O toxoplasmosis    H/O varicella    History of kidney stones    Infections of kidney    several kidney stones, bladder infections   Kidney disease 09/03/2011   Left ureteral calculus with partial obstruction, passed 06/09/2014. CT did show perinephric stranding.    Menometrorrhagia 06/26/10   Plantar fasciitis    PMS (premenstrual syndrome) 07/10/11   Post - coital bleeding 08/29/10   Renal calculus, left    Sinus infection    UTI (lower urinary tract infection) 07/08/2015   Yeast infection     Family History: Family History  Problem Relation Age of Onset   Cancer Other        breast   Cancer Mother        cervical   Depression Mother    Drug abuse Mother    Anemia Mother        blood transfusion   Migraines Mother     Social History   Socioeconomic History   Marital status: Married    Spouse name: Not on file   Number of children: Not on file   Years of education: Not on file   Highest education level: Not on file  Occupational History   Not on file  Tobacco Use   Smoking status: Never   Smokeless tobacco: Never  Vaping Use   Vaping status: Never Used  Substance and Sexual Activity   Alcohol use: No   Drug use: No   Sexual activity: Yes    Birth control/protection: Surgical    Comment: pt spouse had vasec.   Other Topics Concern   Not on file  Social History Narrative   Not on file   Social Drivers of Health   Financial Resource Strain: Not on file  Food Insecurity: Not on file  Transportation Needs: Not on file  Physical  Activity: Not on file  Stress: Not on file  Social Connections: Not on file  Intimate Partner Violence: Not on file      Review of Systems  Constitutional:  Negative for chills and fatigue.  HENT: Negative.  Negative for congestion, rhinorrhea, sneezing and sore throat.   Eyes:  Negative for redness.  Respiratory: Negative.  Negative for cough, chest tightness, shortness of breath and wheezing.   Cardiovascular: Negative.  Negative for chest pain and palpitations.  Gastrointestinal:  Negative for abdominal pain, constipation, diarrhea, nausea and vomiting.  Genitourinary:  Negative for dysuria and frequency.  Musculoskeletal:  Negative for arthralgias, back pain, joint swelling and neck pain.  Skin:  Negative for rash.  Neurological: Negative.  Negative for tremors and numbness.  Hematological:  Negative for adenopathy. Does not  bruise/bleed easily.  Psychiatric/Behavioral:  Positive for behavioral problems (Depression -- takes bupropion ) and sleep disturbance (takes lunesta ). Negative for decreased concentration, self-injury and suicidal ideas. The patient is nervous/anxious.     Vital Signs: BP 108/70   Pulse 78   Temp 98.3 F (36.8 C)   Resp 16   Ht 5' 3 (1.6 m)   Wt 157 lb (71.2 kg)   LMP 02/23/2017 (Exact Date)   SpO2 98%   BMI 27.81 kg/m    Physical Exam Vitals reviewed.  Constitutional:      General: She is not in acute distress.    Appearance: Normal appearance. She is not ill-appearing.  HENT:     Head: Normocephalic and atraumatic.  Eyes:     Extraocular Movements: Extraocular movements intact.  Cardiovascular:     Rate and Rhythm: Normal rate and regular rhythm.  Pulmonary:     Effort: Pulmonary effort is normal. No respiratory distress.  Abdominal:     Tenderness: There is right CVA tenderness.     Comments: Mild CVA tenderness on right  Skin:    General: Skin is warm and dry.  Neurological:     Mental Status: She is alert and oriented to person,  place, and time.  Psychiatric:        Mood and Affect: Mood normal.        Behavior: Behavior normal.        Assessment/Plan: 1. Urinary tract infection without hematuria, site unspecified (Primary) Will treat with cipro  and adjust based on C/S, patient will also follow up with urology if worsening pain/increasing concern for kidney stone - CULTURE, URINE COMPREHENSIVE - ciprofloxacin  (CIPRO ) 500 MG tablet; Take 1 tablet (500 mg total) by mouth 2 (two) times daily for 7 days.  Dispense: 14 tablet; Refill: 0  2. Other insomnia May continue Lunesta  as before, refills given - eszopiclone  (LUNESTA ) 2 MG TABS tablet; Take 1 tablet (2 mg total) by mouth at bedtime as needed for sleep. Take immediately before bedtime  Dispense: 30 tablet; Refill: 2  3. Overweight with body mass index (BMI) of 27 to 27.9 in adult May retry to fill diethylpropion  previously prescribed  4. Moderate major depression (HCC) Continue wellbutrin  as before   General Counseling: Deondria verbalizes understanding of the findings of todays visit and agrees with plan of treatment. I have discussed any further diagnostic evaluation that may be needed or ordered today. We also reviewed her medications today. she has been encouraged to call the office with any questions or concerns that should arise related to todays visit.    Orders Placed This Encounter  Procedures   CULTURE, URINE COMPREHENSIVE    Meds ordered this encounter  Medications   eszopiclone  (LUNESTA ) 2 MG TABS tablet    Sig: Take 1 tablet (2 mg total) by mouth at bedtime as needed for sleep. Take immediately before bedtime    Dispense:  30 tablet    Refill:  2    For future refills   ciprofloxacin  (CIPRO ) 500 MG tablet    Sig: Take 1 tablet (500 mg total) by mouth 2 (two) times daily for 7 days.    Dispense:  14 tablet    Refill:  0    This patient was seen by Tinnie Pro, PA-C in collaboration with Dr. Sigrid Bathe as a part of collaborative  care agreement.   Total time spent:30 Minutes Time spent includes review of chart, medications, test results, and follow up plan with the patient.  Dr Fozia M Khan Internal medicine

## 2024-02-09 ENCOUNTER — Other Ambulatory Visit: Payer: Self-pay | Admitting: Nurse Practitioner

## 2024-02-09 DIAGNOSIS — Z6826 Body mass index (BMI) 26.0-26.9, adult: Secondary | ICD-10-CM

## 2024-02-09 MED ORDER — DIETHYLPROPION HCL ER 75 MG PO TB24: 75.0000 mg | ORAL_TABLET | Freq: Every day | ORAL | 2 refills | Status: DC

## 2024-02-11 ENCOUNTER — Ambulatory Visit: Admitting: Nurse Practitioner

## 2024-02-12 ENCOUNTER — Ambulatory Visit: Payer: Self-pay | Admitting: Physician Assistant

## 2024-02-12 LAB — CULTURE, URINE COMPREHENSIVE

## 2024-05-09 ENCOUNTER — Ambulatory Visit: Admitting: Nurse Practitioner

## 2024-05-10 ENCOUNTER — Ambulatory Visit (INDEPENDENT_AMBULATORY_CARE_PROVIDER_SITE_OTHER): Admitting: Nurse Practitioner

## 2024-05-10 ENCOUNTER — Encounter: Payer: Self-pay | Admitting: Nurse Practitioner

## 2024-05-10 ENCOUNTER — Ambulatory Visit: Admitting: Nurse Practitioner

## 2024-05-10 VITALS — BP 110/80 | HR 84 | Temp 97.9°F | Resp 16 | Ht 63.0 in | Wt 160.8 lb

## 2024-05-10 DIAGNOSIS — E663 Overweight: Secondary | ICD-10-CM

## 2024-05-10 DIAGNOSIS — Z6828 Body mass index (BMI) 28.0-28.9, adult: Secondary | ICD-10-CM

## 2024-05-10 DIAGNOSIS — N951 Menopausal and female climacteric states: Secondary | ICD-10-CM | POA: Diagnosis not present

## 2024-05-10 DIAGNOSIS — J301 Allergic rhinitis due to pollen: Secondary | ICD-10-CM | POA: Diagnosis not present

## 2024-05-10 DIAGNOSIS — F321 Major depressive disorder, single episode, moderate: Secondary | ICD-10-CM

## 2024-05-10 DIAGNOSIS — E782 Mixed hyperlipidemia: Secondary | ICD-10-CM

## 2024-05-10 DIAGNOSIS — I10 Essential (primary) hypertension: Secondary | ICD-10-CM

## 2024-05-10 DIAGNOSIS — G4709 Other insomnia: Secondary | ICD-10-CM

## 2024-05-10 MED ORDER — ESTRADIOL 0.05 MG/24HR TD PTWK: 0.0500 mg | MEDICATED_PATCH | TRANSDERMAL | 3 refills | Status: AC

## 2024-05-10 MED ORDER — MONTELUKAST SODIUM 10 MG PO TABS: ORAL_TABLET | ORAL | 1 refills | Status: AC

## 2024-05-10 MED ORDER — DIETHYLPROPION HCL ER 75 MG PO TB24: 75.0000 mg | ORAL_TABLET | Freq: Every day | ORAL | 2 refills | Status: AC

## 2024-05-10 MED ORDER — ESZOPICLONE 2 MG PO TABS: 2.0000 mg | ORAL_TABLET | Freq: Every evening | ORAL | 2 refills | Status: AC | PRN

## 2024-05-10 MED ORDER — BISOPROLOL-HYDROCHLOROTHIAZIDE 2.5-6.25 MG PO TABS: 1.0000 | ORAL_TABLET | Freq: Every day | ORAL | 1 refills | Status: AC

## 2024-05-10 MED ORDER — ROSUVASTATIN CALCIUM 5 MG PO TABS: 5.0000 mg | ORAL_TABLET | Freq: Every day | ORAL | 1 refills | Status: AC

## 2024-05-10 MED ORDER — BUPROPION HCL ER (XL) 150 MG PO TB24: 150.0000 mg | ORAL_TABLET | Freq: Every day | ORAL | 1 refills | Status: AC

## 2024-05-10 NOTE — Progress Notes (Signed)
 [facility] [address] [place], [place] [address]  Internal MEDICINE  Office Visit Note  Patient Name: Kayla Jimenez  [id number]  [id number]  Date of Service: 05/10/2024  Chief Complaint  Patient presents with   Follow-up    HPI Kayla Jimenez presents for a follow-up visit for medication refills, hypertension, high cholesterol, weight loss and perimenopause. Weight loss -- taking diethylpropion  to help with appetite suppression and weight loss. Current BMI is elevated at 28.48.  Hypertension -- controlled on bisoprolol -hydrochlorothiazide   High cholesterol -- taking rosuvastatin  daily.  Perimenopause -- on climara  patch but has been having more frequent hot flashes.  All of her medication copays are increasing this year. Insomnia -- taking lunesta  for sleep which helps, due for refills.     Current Medication: Outpatient Encounter Medications as of 05/10/2024  Medication Sig   estradiol  (CLIMARA ) 0.05 mg/24hr patch Place 1 patch (0.05 mg total) onto the skin once a week.   bisoprolol -hydrochlorothiazide  (ZIAC ) 2.5-6.25 MG tablet Take 1 tablet by mouth daily.   buPROPion  (WELLBUTRIN  XL) 150 MG 24 hr tablet Take 1 tablet (150 mg total) by mouth daily.   Cranberry-Vitamin C (CRANBERRY CONCENTRATE/VITAMINC) 15000-100 MG CAPS Take by mouth.   cyanocobalamin  (VITAMIN B12) 1000 MCG/ML injection Inject 1 mL (1,000 mcg total) into the skin every 30 (thirty) days.   Diethylpropion  HCl CR 75 MG TB24 Take 1 tablet (75 mg total) by mouth daily before breakfast.   eszopiclone  (LUNESTA ) 2 MG TABS tablet Take 1 tablet (2 mg total) by mouth at bedtime as needed for sleep. Take immediately before bedtime   fluticasone  (FLONASE ) 50 MCG/ACT nasal spray Place 2 sprays into both nostrils daily.   montelukast  (SINGULAIR ) 10 MG tablet TAKE 1 TABLET BY MOUTH EVERYDAY AT BEDTIME   rosuvastatin  (CRESTOR ) 5 MG tablet Take 1 tablet (5 mg total) by mouth daily.   Syringe/Needle, Disp, (SYRINGE  3CC/25GX1) 25G X 1 3 ML MISC Use 1 syringe and needle to administer B12 injection, change the needle to a new needle after drawing up the medication to administer it.   [DISCONTINUED] bisoprolol -hydrochlorothiazide  (ZIAC ) 2.5-6.25 MG tablet Take 1 tablet by mouth daily.   [DISCONTINUED] buPROPion  (WELLBUTRIN  XL) 150 MG 24 hr tablet Take 1 tablet (150 mg total) by mouth daily.   [DISCONTINUED] Diethylpropion  HCl CR 75 MG TB24 Take 1 tablet (75 mg total) by mouth daily before breakfast.   [DISCONTINUED] estradiol  (CLIMARA  - DOSED IN MG/24 HR) 0.0375 mg/24hr patch Place 1 patch (0.0375 mg total) onto the skin once a week.   [DISCONTINUED] eszopiclone  (LUNESTA ) 2 MG TABS tablet Take 1 tablet (2 mg total) by mouth at bedtime as needed for sleep. Take immediately before bedtime   [DISCONTINUED] montelukast  (SINGULAIR ) 10 MG tablet TAKE 1 TABLET BY MOUTH EVERYDAY AT BEDTIME   [DISCONTINUED] rosuvastatin  (CRESTOR ) 5 MG tablet Take 1 tablet (5 mg total) by mouth daily.   No facility-administered encounter medications on file as of 05/10/2024.    Surgical History: Past Surgical History:  Procedure Laterality Date   ABDOMINAL HYSTERECTOMY     ANTERIOR AND POSTERIOR REPAIR  03/16/2012   Procedure: ANTERIOR (CYSTOCELE) AND POSTERIOR REPAIR (RECTOCELE);  Surgeon: Jon CINDERELLA Rummer, MD;  Location: [facility];  Service: Gynecology;  Laterality: N/A;  anterior repair/cysto   BLADDER SUSPENSION  03/16/2012   Procedure: TRANSVAGINAL TAPE (TVT) PROCEDURE;  Surgeon: Jon CINDERELLA Rummer, MD;  Location: [facility];  Service: Gynecology;  Laterality: N/A;   BREAST REDUCTION SURGERY  6/04   CHOLECYSTECTOMY  06/28/14   CYSTOSCOPY  03/16/2012   Procedure: CYSTOSCOPY;  Surgeon: Jon CINDERELLA Rummer, MD;  Location: [facility];  Service: Gynecology;  Laterality: N/A;   CYSTOSCOPY  03/16/2017   Procedure: CYSTOSCOPY;  Surgeon: Rummer Jon, MD;  Location: [facility];  Service: Gynecology;;   DILITATION & CURRETTAGE/HYSTROSCOPY WITH NOVASURE  ABLATION  03/16/2012   Procedure: DILATATION & CURETTAGE/HYSTEROSCOPY WITH NOVASURE ABLATION;  Surgeon: Jon CINDERELLA Rummer, MD;  Location: [facility];  Service: Gynecology;  Laterality: N/A;   EXTRACORPOREAL SHOCK WAVE LITHOTRIPSY Right 07/18/2021   Procedure: EXTRACORPOREAL SHOCK WAVE LITHOTRIPSY (ESWL);  Surgeon: Francisca Redell BROCKS, MD;  Location: [facility];  Service: Urology;  Laterality: Right;   TONSILLECTOMY AND ADENOIDECTOMY     VAGINAL HYSTERECTOMY Bilateral 03/16/2017   Procedure: HYSTERECTOMY VAGINAL Bilateral Salpingectomy ;  Surgeon: Rummer Jon, MD;  Location: [facility];  Service: Gynecology;  Laterality: Bilateral;    Medical History: Past Medical History:  Diagnosis Date   Anxiety 08/29/10   Bursitis    left foot   BV (bacterial vaginosis) 08/29/10   Complication of anesthesia 2004   vomited   Gross hematuria 07/08/2015   H/O bladder infections    H/O mumps    H/O toxoplasmosis    H/O varicella    History of kidney stones    Infections of kidney    several kidney stones, bladder infections   Kidney disease 09/03/2011   Left ureteral calculus with partial obstruction, passed 06/09/2014. CT did show perinephric stranding.    Menometrorrhagia 06/26/10   Plantar fasciitis    PMS (premenstrual syndrome) 07/10/11   Post - coital bleeding 08/29/10   Renal calculus, left    Sinus infection    UTI (lower urinary tract infection) 07/08/2015   Yeast infection     Family History: Family History  Problem Relation Age of Onset   Cancer Other        breast   Cancer Mother        cervical   Depression Mother    Drug abuse Mother    Anemia Mother        blood transfusion   Migraines Mother     Social History   Socioeconomic History   Marital status: Married    Spouse name: Not on file   Number of children: Not on file   Years of education: Not on file   Highest education level: Not on file  Occupational History   Not on file  Tobacco Use   Smoking status: Never   Smokeless  tobacco: Never  Vaping Use   Vaping status: Never Used  Substance and Sexual Activity   Alcohol use: No   Drug use: No   Sexual activity: Yes    Birth control/protection: Surgical    Comment: pt spouse had vasec.   Other Topics Concern   Not on file  Social History Narrative   Not on file   Social Drivers of Health   Tobacco Use: Low Risk (05/10/2024)   Patient History    Smoking Tobacco Use: Never    Smokeless Tobacco Use: Never    Passive Exposure: Not on file  Financial Resource Strain: Not on file  Food Insecurity: Not on file  Transportation Needs: Not on file  Physical Activity: Not on file  Stress: Not on file  Social Connections: Not on file  Intimate Partner Violence: Not on file  Depression (PHQ2-9): Low Risk (02/08/2024)   Depression (PHQ2-9)    PHQ-2 Score: 0  Alcohol Screen: Low  Risk (07/15/2021)   Alcohol Screen    Last Alcohol Screening Score (AUDIT): 0  Housing: Not on file  Utilities: Not on file  Health Literacy: Not on file      Review of Systems  Constitutional:  Positive for appetite change and unexpected weight change. Negative for chills and fatigue.  HENT: Negative.  Negative for congestion, rhinorrhea, sneezing and sore throat.   Eyes:  Negative for redness.  Respiratory: Negative.  Negative for cough, chest tightness, shortness of breath and wheezing.   Cardiovascular: Negative.  Negative for chest pain and palpitations.  Gastrointestinal:  Negative for abdominal pain, constipation, diarrhea, nausea and vomiting.  Genitourinary:  Negative for dysuria and frequency.  Musculoskeletal:  Negative for arthralgias, back pain, joint swelling and neck pain.  Skin:  Negative for rash.  Neurological: Negative.  Negative for tremors and numbness.  Hematological:  Negative for adenopathy. Does not bruise/bleed easily.  Psychiatric/Behavioral:  Positive for behavioral problems (Depression -- takes bupropion ) and sleep disturbance (takes lunesta ). Negative  for decreased concentration, self-injury and suicidal ideas. The patient is nervous/anxious.     Vital Signs: BP 110/80   Pulse 84   Temp 97.9 F (36.6 C)   Resp 16   Ht 5' 3 (1.6 m)   Wt 160 lb 12.8 oz (72.9 kg)   LMP 02/23/2017   SpO2 98%   BMI 28.48 kg/m    Physical Exam Vitals reviewed.  Constitutional:      General: She is not in acute distress.    Appearance: Normal appearance. She is not ill-appearing.  HENT:     Head: Normocephalic and atraumatic.  Eyes:     Pupils: Pupils are equal, round, and reactive to light.  Cardiovascular:     Rate and Rhythm: Normal rate and regular rhythm.  Pulmonary:     Effort: Pulmonary effort is normal. No respiratory distress.  Neurological:     Mental Status: She is alert and oriented to person, place, and time.  Psychiatric:        Mood and Affect: Mood normal.        Behavior: Behavior normal.        Assessment/Plan: 1. Benign hypertension (Primary) Stable, continue bisoprolol -hydrochlorothiazide  as prescribed. - bisoprolol -hydrochlorothiazide  (ZIAC ) 2.5-6.25 MG tablet; Take 1 tablet by mouth daily.  Dispense: 90 tablet; Refill: 1  2. Mixed hyperlipidemia Continue rosuvastatin  as prescribed.  - rosuvastatin  (CRESTOR ) 5 MG tablet; Take 1 tablet (5 mg total) by mouth daily.  Dispense: 90 tablet; Refill: 1  3. Vasomotor symptoms due to menopause Climara  patch dose increased, follow up in 3 months  - estradiol  (CLIMARA ) 0.05 mg/24hr patch; Place 1 patch (0.05 mg total) onto the skin once a week.  Dispense: 12 patch; Refill: 3  4. Other insomnia Continue lunesta  as prescribed. Follow up in 3 months for additional refills.  - eszopiclone  (LUNESTA ) 2 MG TABS tablet; Take 1 tablet (2 mg total) by mouth at bedtime as needed for sleep. Take immediately before bedtime  Dispense: 30 tablet; Refill: 2  5. Allergic rhinitis due to pollen, unspecified seasonality Continue montelukast  as prescribed.  - montelukast  (SINGULAIR ) 10  MG tablet; TAKE 1 TABLET BY MOUTH EVERYDAY AT BEDTIME  Dispense: 90 tablet; Refill: 1  6. Overweight with body mass index (BMI) of 28 to 28.9 in adult Continue diethylpropion  as prescribed. Follow up in 3 months for additional refills.  - Diethylpropion  HCl CR 75 MG TB24; Take 1 tablet (75 mg total) by mouth daily before breakfast.  Dispense:  30 tablet; Refill: 2  7. Moderate major depression (HCC) Continue bupropion  as prescribed.  - buPROPion  (WELLBUTRIN  XL) 150 MG 24 hr tablet; Take 1 tablet (150 mg total) by mouth daily.  Dispense: 90 tablet; Refill: 1   General Counseling: Lois verbalizes understanding of the findings of todays visit and agrees with plan of treatment. I have discussed any further diagnostic evaluation that may be needed or ordered today. We also reviewed her medications today. she has been encouraged to call the office with any questions or concerns that should arise related to todays visit.    No orders of the defined types were placed in this encounter.   Meds ordered this encounter  Medications   bisoprolol -hydrochlorothiazide  (ZIAC ) 2.5-6.25 MG tablet    Sig: Take 1 tablet by mouth daily.    Dispense:  90 tablet    Refill:  1   eszopiclone  (LUNESTA ) 2 MG TABS tablet    Sig: Take 1 tablet (2 mg total) by mouth at bedtime as needed for sleep. Take immediately before bedtime    Dispense:  30 tablet    Refill:  2    For future refills   buPROPion  (WELLBUTRIN  XL) 150 MG 24 hr tablet    Sig: Take 1 tablet (150 mg total) by mouth daily.    Dispense:  90 tablet    Refill:  1   Diethylpropion  HCl CR 75 MG TB24    Sig: Take 1 tablet (75 mg total) by mouth daily before breakfast.    Dispense:  30 tablet    Refill:  2    Fill new script today, patient will bring goodrx coupon, do NOT run through insurance. FILL THIS WITHOUT INSURANCE PLEASE.   estradiol  (CLIMARA ) 0.05 mg/24hr patch    Sig: Place 1 patch (0.05 mg total) onto the skin once a week.    Dispense:  12  patch    Refill:  3    Note increased dose, please fill new script today asap.   rosuvastatin  (CRESTOR ) 5 MG tablet    Sig: Take 1 tablet (5 mg total) by mouth daily.    Dispense:  90 tablet    Refill:  1   montelukast  (SINGULAIR ) 10 MG tablet    Sig: TAKE 1 TABLET BY MOUTH EVERYDAY AT BEDTIME    Dispense:  90 tablet    Refill:  1    Return in about 3 months (around 08/03/2024) for F/U, Zamaria Brazzle PCP lunesta  refills .   Total time spent:30 Minutes Time spent includes review of chart, medications, test results, and follow up plan with the patient.   [place] Controlled Substance Database was reviewed by me.  This patient was seen by Mardy Maxin, FNP-C in collaboration with Dr. Sigrid Bathe as a part of collaborative care agreement.   Majid Mccravy R. Maxin, MSN, FNP-C Internal medicine

## 2024-06-13 ENCOUNTER — Encounter: Admitting: Nurse Practitioner

## 2024-08-02 ENCOUNTER — Encounter: Admitting: Nurse Practitioner
